# Patient Record
Sex: Female | Born: 1985 | Race: Black or African American | Hispanic: No | Marital: Single
Health system: Southern US, Community
[De-identification: ages and names within clinical notes are randomized; demographics above are authoritative.]

## PROBLEM LIST (undated history)

## (undated) ENCOUNTER — Inpatient Hospital Stay (HOSPITAL_COMMUNITY): Payer: Self-pay

## (undated) DIAGNOSIS — F1310 Sedative, hypnotic or anxiolytic abuse, uncomplicated: Secondary | ICD-10-CM

## (undated) DIAGNOSIS — N390 Urinary tract infection, site not specified: Secondary | ICD-10-CM

## (undated) DIAGNOSIS — Z8659 Personal history of other mental and behavioral disorders: Secondary | ICD-10-CM

## (undated) DIAGNOSIS — R519 Headache, unspecified: Secondary | ICD-10-CM

## (undated) DIAGNOSIS — A749 Chlamydial infection, unspecified: Secondary | ICD-10-CM

## (undated) DIAGNOSIS — F329 Major depressive disorder, single episode, unspecified: Secondary | ICD-10-CM

## (undated) DIAGNOSIS — F32A Depression, unspecified: Secondary | ICD-10-CM

## (undated) DIAGNOSIS — R51 Headache: Secondary | ICD-10-CM

## (undated) DIAGNOSIS — F909 Attention-deficit hyperactivity disorder, unspecified type: Secondary | ICD-10-CM

## (undated) DIAGNOSIS — R45850 Homicidal ideations: Secondary | ICD-10-CM

## (undated) HISTORY — PX: TONSILLECTOMY: SUR1361

---
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
## 2007-06-05 ENCOUNTER — Inpatient Hospital Stay (HOSPITAL_COMMUNITY): Admission: AD | Admit: 2007-06-05 | Discharge: 2007-06-05 | Payer: Self-pay | Admitting: Family Medicine

## 2007-06-16 ENCOUNTER — Emergency Department (HOSPITAL_COMMUNITY): Admission: EM | Admit: 2007-06-16 | Discharge: 2007-06-16 | Payer: Self-pay | Admitting: Emergency Medicine

## 2007-07-14 ENCOUNTER — Emergency Department (HOSPITAL_COMMUNITY): Admission: EM | Admit: 2007-07-14 | Discharge: 2007-07-14 | Payer: Self-pay | Admitting: Emergency Medicine

## 2008-01-20 ENCOUNTER — Emergency Department (HOSPITAL_COMMUNITY): Admission: EM | Admit: 2008-01-20 | Discharge: 2008-01-20 | Payer: Self-pay | Admitting: Emergency Medicine

## 2008-01-25 ENCOUNTER — Emergency Department (HOSPITAL_COMMUNITY): Admission: EM | Admit: 2008-01-25 | Discharge: 2008-01-25 | Payer: Self-pay | Admitting: Emergency Medicine

## 2008-02-23 ENCOUNTER — Inpatient Hospital Stay (HOSPITAL_COMMUNITY): Admission: AD | Admit: 2008-02-23 | Discharge: 2008-02-23 | Payer: Self-pay | Admitting: Obstetrics & Gynecology

## 2008-04-18 ENCOUNTER — Encounter: Payer: Self-pay | Admitting: Emergency Medicine

## 2008-04-18 ENCOUNTER — Inpatient Hospital Stay (HOSPITAL_COMMUNITY): Admission: AD | Admit: 2008-04-18 | Discharge: 2008-04-19 | Payer: Self-pay | Admitting: Obstetrics & Gynecology

## 2008-04-19 ENCOUNTER — Inpatient Hospital Stay (HOSPITAL_COMMUNITY): Admission: AD | Admit: 2008-04-19 | Discharge: 2008-04-19 | Payer: Self-pay | Admitting: Obstetrics & Gynecology

## 2008-05-28 DIAGNOSIS — IMO0002 Reserved for concepts with insufficient information to code with codable children: Secondary | ICD-10-CM | POA: Insufficient documentation

## 2008-05-31 ENCOUNTER — Inpatient Hospital Stay (HOSPITAL_COMMUNITY): Admission: AD | Admit: 2008-05-31 | Discharge: 2008-05-31 | Payer: Self-pay | Admitting: Obstetrics & Gynecology

## 2008-06-24 ENCOUNTER — Ambulatory Visit: Payer: Self-pay | Admitting: Obstetrics & Gynecology

## 2008-06-24 ENCOUNTER — Encounter: Payer: Self-pay | Admitting: Physician Assistant

## 2008-06-24 ENCOUNTER — Ambulatory Visit (HOSPITAL_COMMUNITY): Admission: RE | Admit: 2008-06-24 | Discharge: 2008-06-24 | Payer: Self-pay | Admitting: Obstetrics & Gynecology

## 2008-06-24 LAB — CONVERTED CEMR LAB
Antibody Screen: NEGATIVE
Chlamydia, DNA Probe: NEGATIVE
HCT: 32.4 % — ABNORMAL LOW (ref 36.0–46.0)
Hemoglobin: 10.8 g/dL — ABNORMAL LOW (ref 12.0–15.0)
Hepatitis B Surface Ag: NEGATIVE
Hgb A: 97.5 % (ref 96.8–97.8)
Hgb F Quant: 0 % (ref 0.0–2.0)
Hgb S Quant: 0 % (ref 0.0–0.0)
Lymphs Abs: 1.8 10*3/uL (ref 0.7–4.0)
MCHC: 33.3 g/dL (ref 30.0–36.0)
MCV: 94.7 fL (ref 78.0–100.0)
Neutro Abs: 3.5 10*3/uL (ref 1.7–7.7)
Platelets: 194 10*3/uL (ref 150–400)
RBC: 3.42 M/uL — ABNORMAL LOW (ref 3.87–5.11)
WBC: 6.2 10*3/uL (ref 4.0–10.5)

## 2008-07-15 ENCOUNTER — Other Ambulatory Visit: Payer: Self-pay | Admitting: Obstetrics & Gynecology

## 2008-07-15 ENCOUNTER — Ambulatory Visit: Payer: Self-pay | Admitting: Obstetrics & Gynecology

## 2008-07-22 ENCOUNTER — Other Ambulatory Visit: Payer: Self-pay | Admitting: Obstetrics & Gynecology

## 2008-07-27 ENCOUNTER — Ambulatory Visit: Payer: Self-pay | Admitting: Obstetrics and Gynecology

## 2008-07-27 ENCOUNTER — Inpatient Hospital Stay (HOSPITAL_COMMUNITY): Admission: AD | Admit: 2008-07-27 | Discharge: 2008-07-27 | Payer: Self-pay | Admitting: Obstetrics & Gynecology

## 2008-07-29 ENCOUNTER — Ambulatory Visit: Payer: Self-pay | Admitting: Obstetrics & Gynecology

## 2008-08-05 ENCOUNTER — Ambulatory Visit: Payer: Self-pay | Admitting: Obstetrics & Gynecology

## 2008-08-08 ENCOUNTER — Ambulatory Visit: Payer: Self-pay | Admitting: Family

## 2008-08-08 ENCOUNTER — Encounter: Payer: Self-pay | Admitting: Obstetrics & Gynecology

## 2008-08-08 ENCOUNTER — Inpatient Hospital Stay (HOSPITAL_COMMUNITY): Admission: AD | Admit: 2008-08-08 | Discharge: 2008-08-10 | Payer: Self-pay | Admitting: Obstetrics & Gynecology

## 2008-08-17 ENCOUNTER — Ambulatory Visit: Payer: Self-pay | Admitting: Family Medicine

## 2008-08-17 DIAGNOSIS — Z87891 Personal history of nicotine dependence: Secondary | ICD-10-CM

## 2008-08-19 ENCOUNTER — Telehealth: Payer: Self-pay | Admitting: *Deleted

## 2009-06-03 IMAGING — US US OB COMP LESS 14 WK
1 series · 14 of 28 positions shown · non-contrast
Comparison: none

CLINICAL DATA: Pregnant, abdominal pain

OBSTETRIC <14 WK ULTRASOUND, TRANSVAGINAL OB US, AND FETAL NUCHAL
TRANSLUCENCY US
TECHNIQUE: Transabdominal and transvaginal ultrasound was
performed for evaluation of the gestation as well as the maternal
uterus and adnexal regions.

[Series 1: unknown · 0.32mm/px · 14 of 51 slices shown]
[im 2/51]
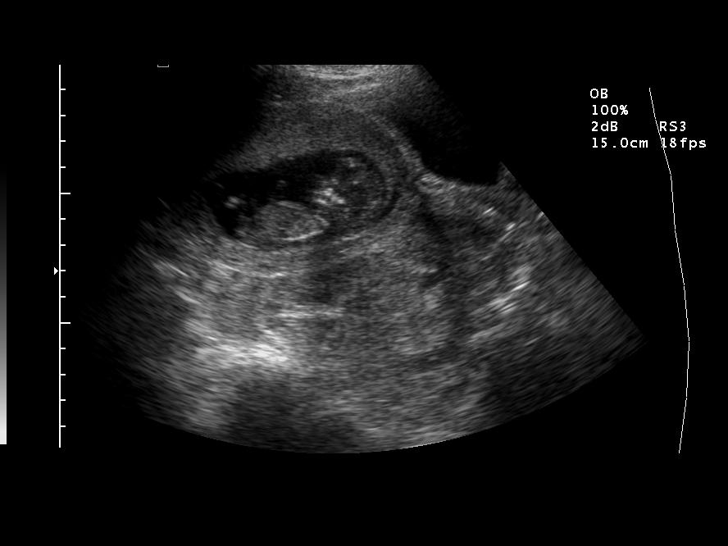
[im 6/51]
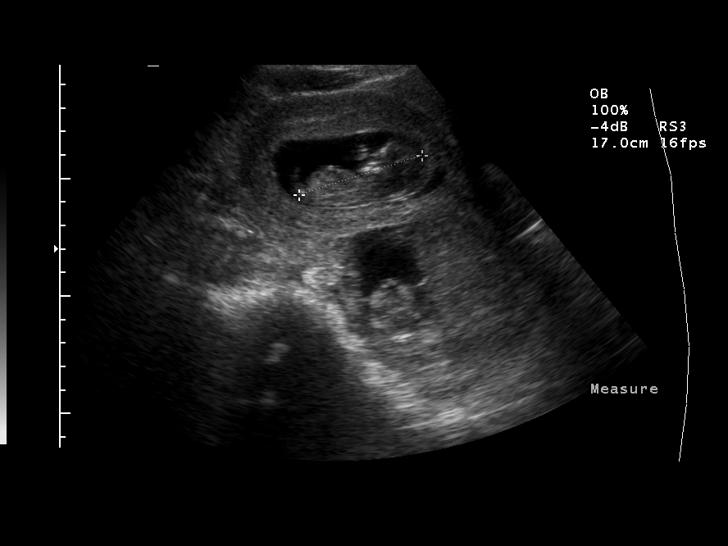
[im 10/51]
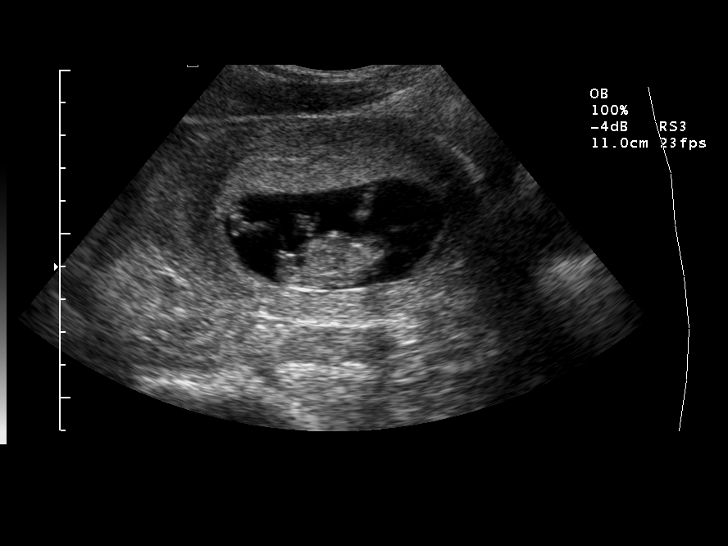
[im 13/51]
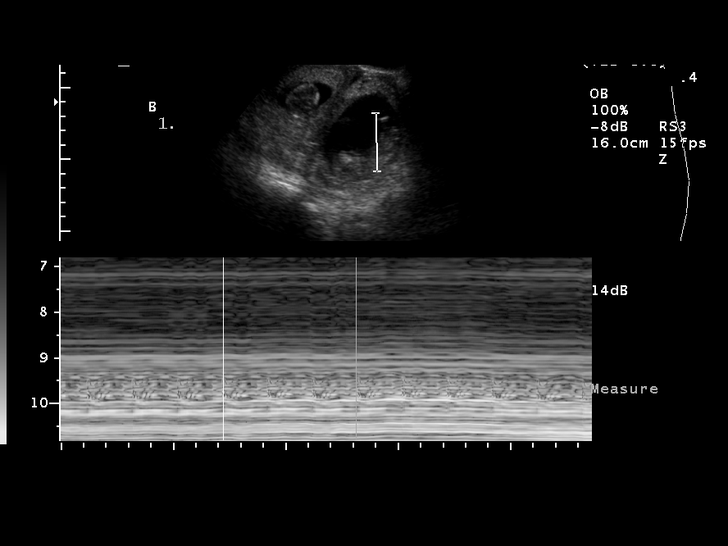
[im 17/51]
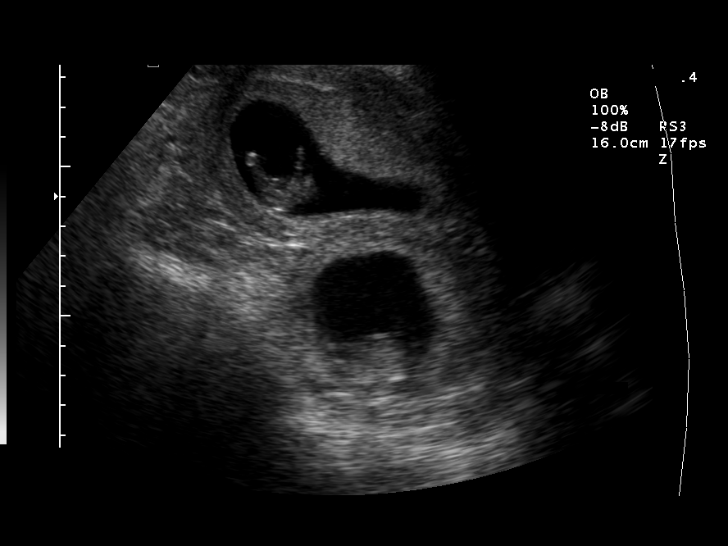
[im 21/51]
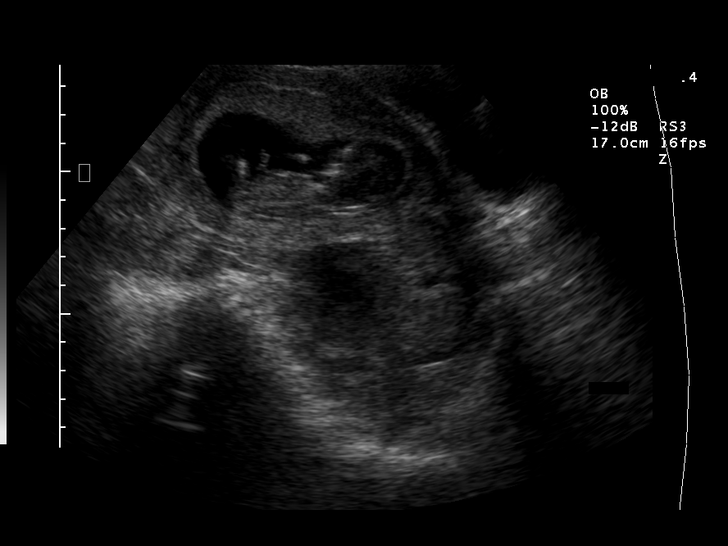
[im 25/51]
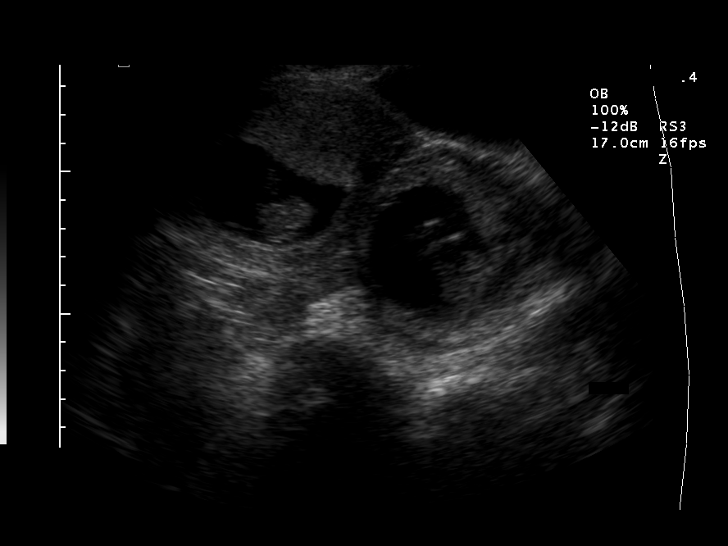
[im 28/51]
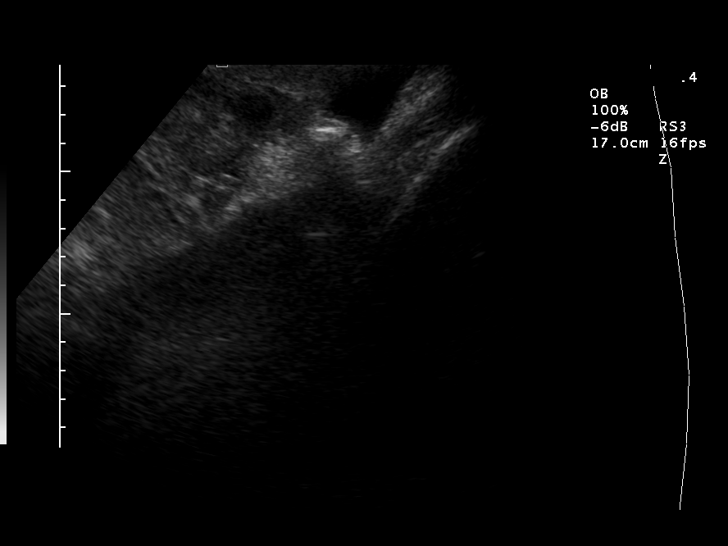
[im 32/51]
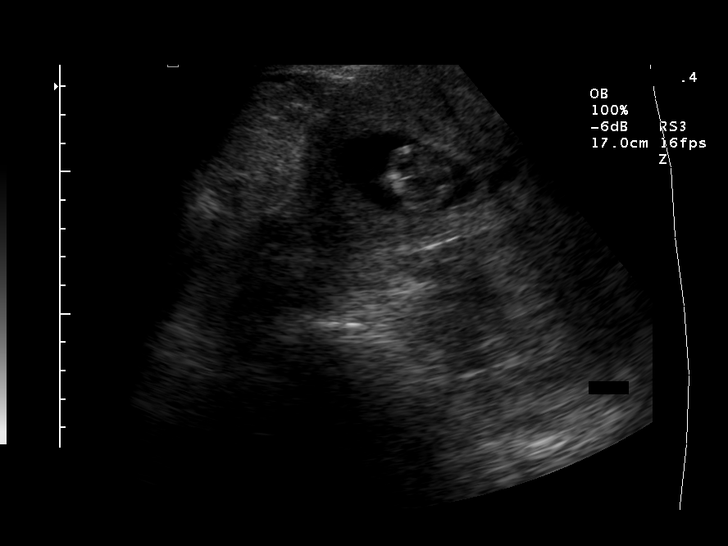
[im 36/51]
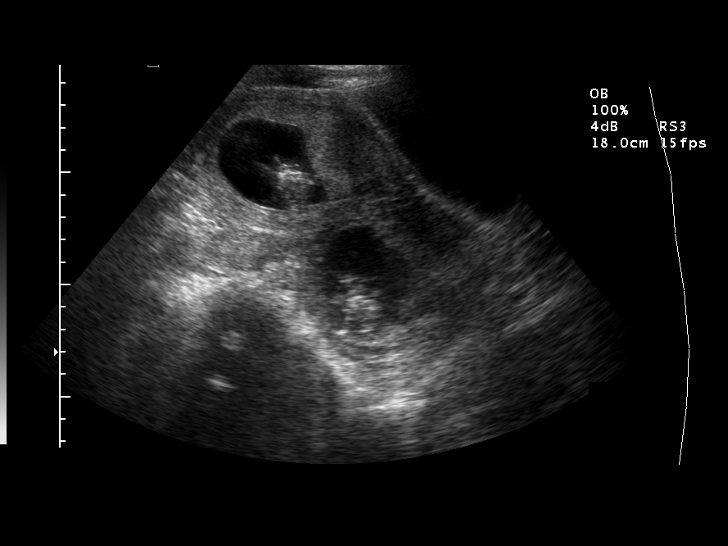
[im 39/51]
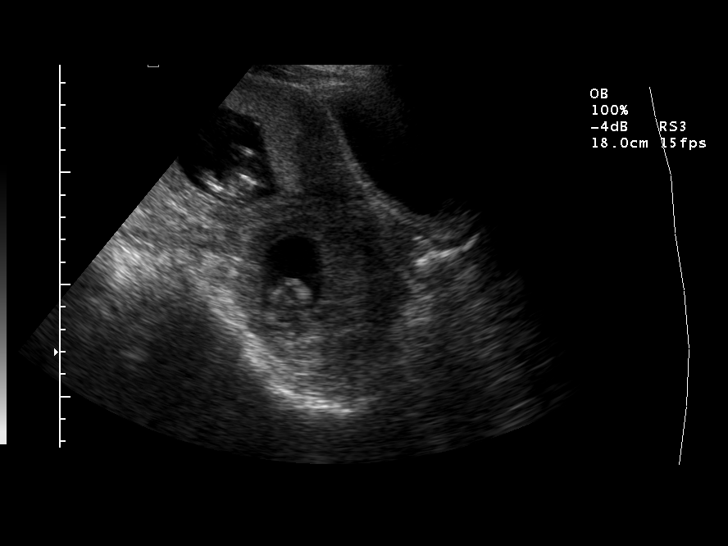
[im 43/51]
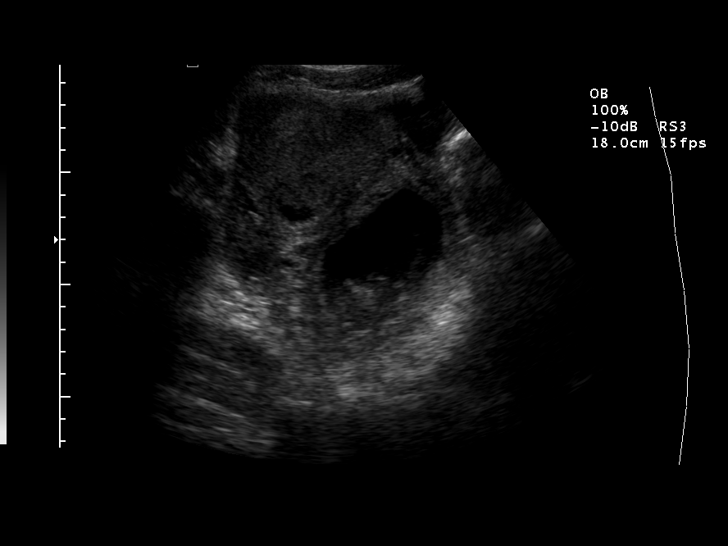
[im 47/51]
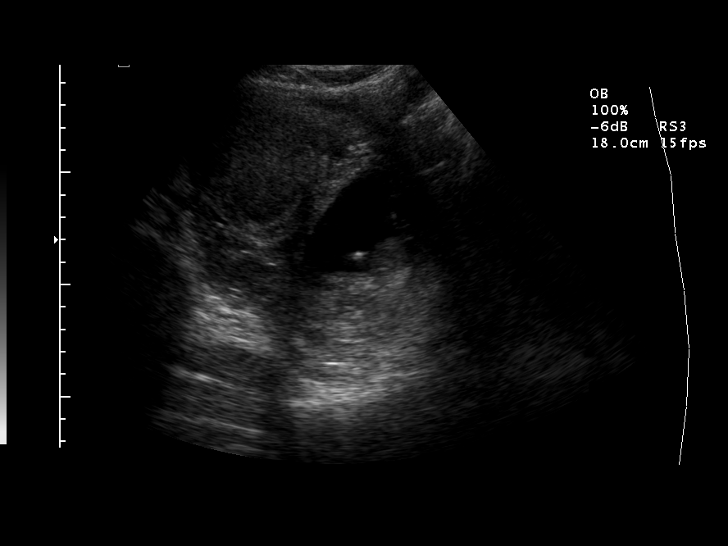
[im 51/51]
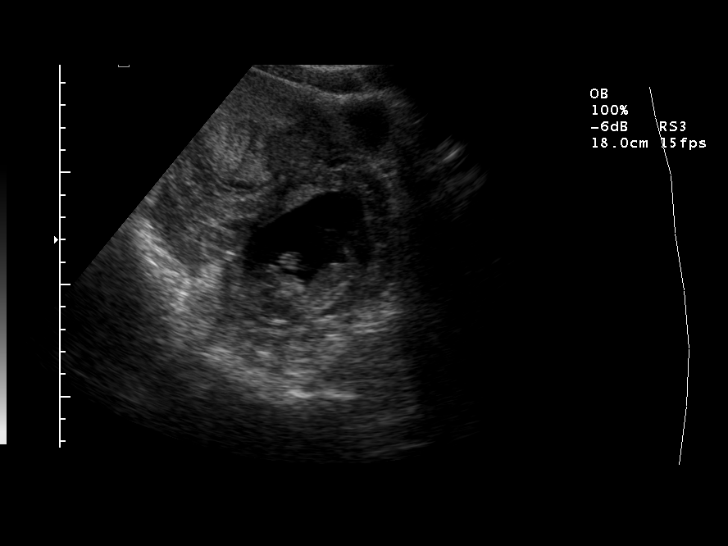

[14 of 28 positions shown; findings below may reference images not displayed]

Number of gestation: 2

Fetus A :

Heart Rate: 145 bpm

CRL:  61 millimeters.         12w  4d

Fetus B:

Heart Rate: 153 bpm

CRL:  51.6         12w  2d

Maternal uterus/adnexae:

The uterus appeared unremarkable.  The cervix was poorly
visualized.

No subchorionic hemorrhage identified.

A small amount of free fluid was identified within both adnexal
regions.
IMPRESSION: 1.  Two intrauterine pregnancy is identified without complicating
features.
2.  Small amount of free fluid within the pelvis may be
physiologic.

## 2009-08-31 IMAGING — US US OB COMP +14 WK
1 series · 14 of 28 positions shown · non-contrast
Comparison: none

CLINICAL DATA: Fall; assigned gestational age is 6 months with
twins per patient history

[Series 1: unknown · 0.35mm/px · 14 of 47 slices shown]
[im 2/47]
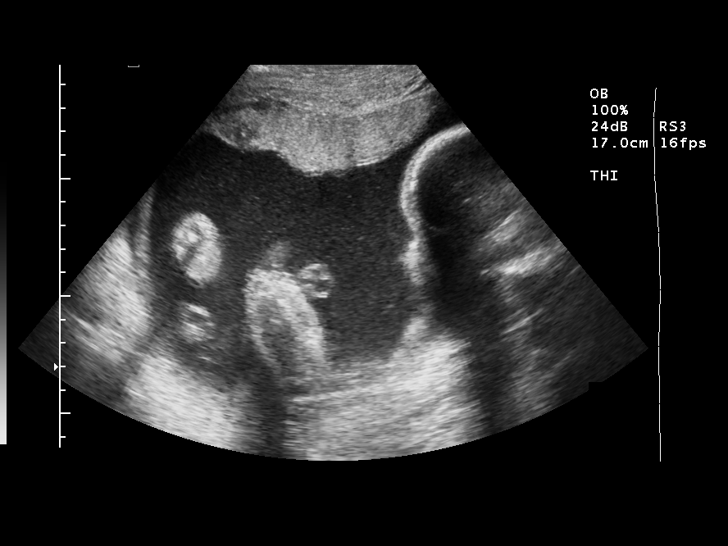
[im 6/47]
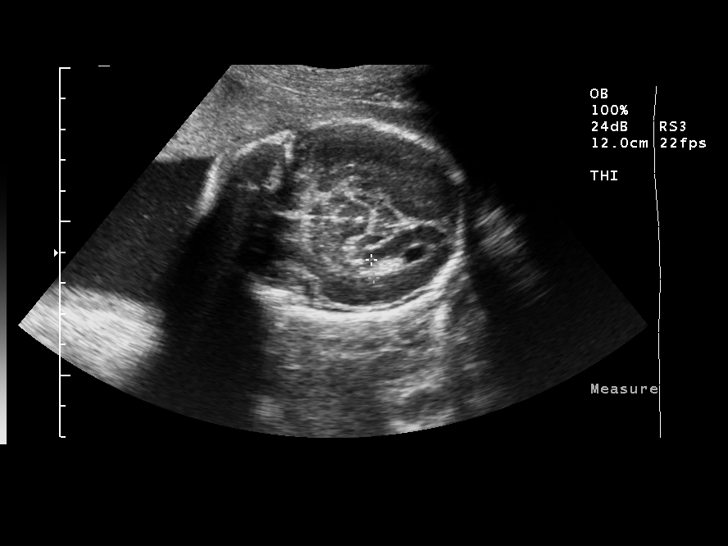
[im 9/47]
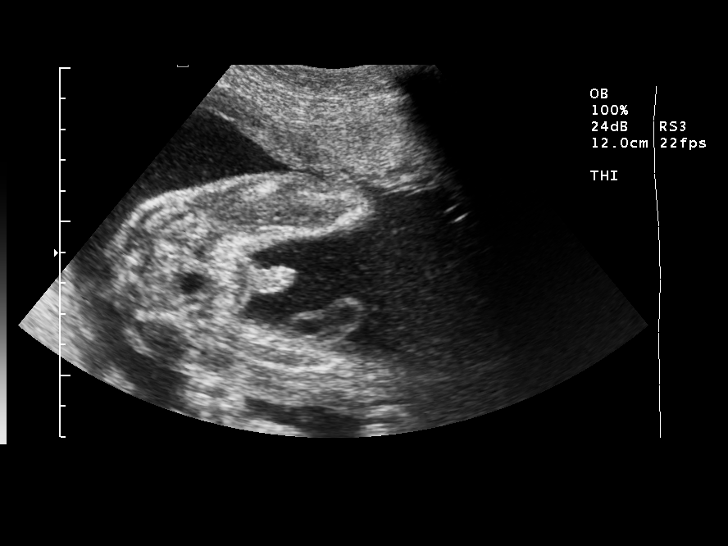
[im 12/47]
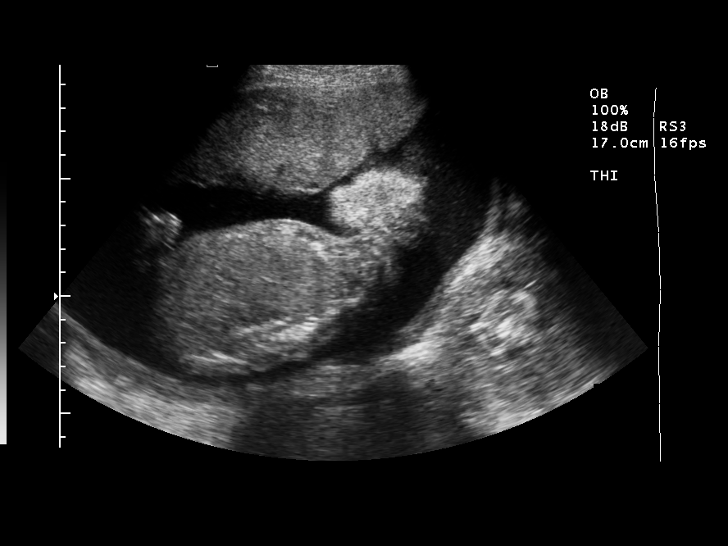
[im 16/47]
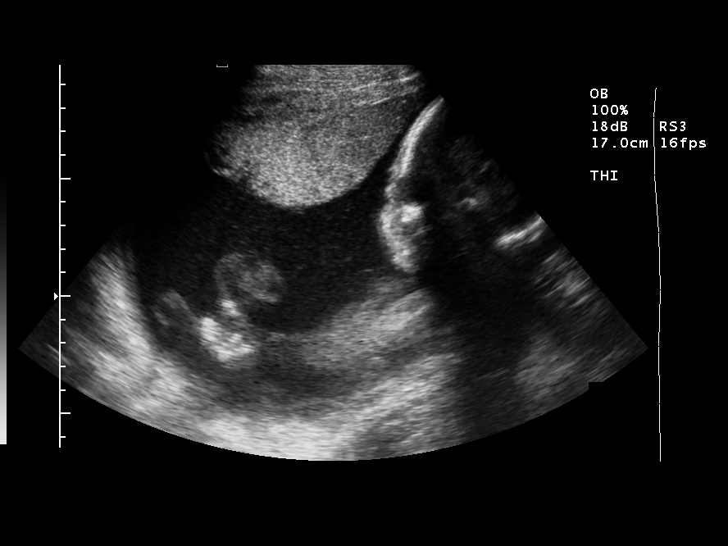
[im 19/47]
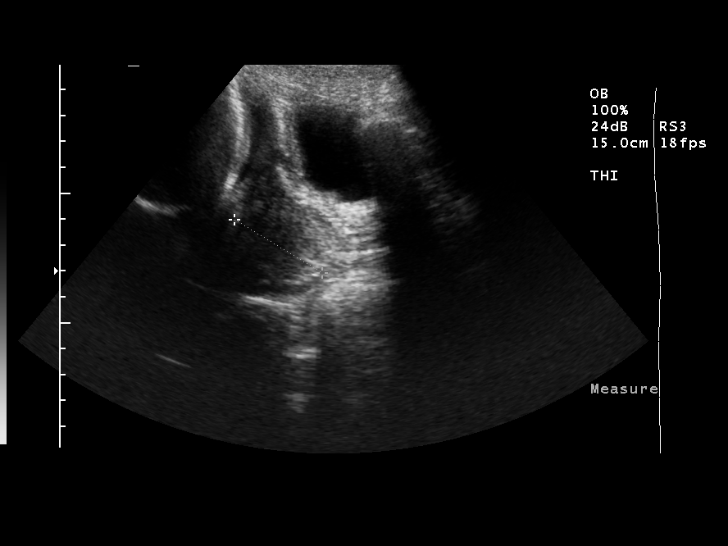
[im 23/47]
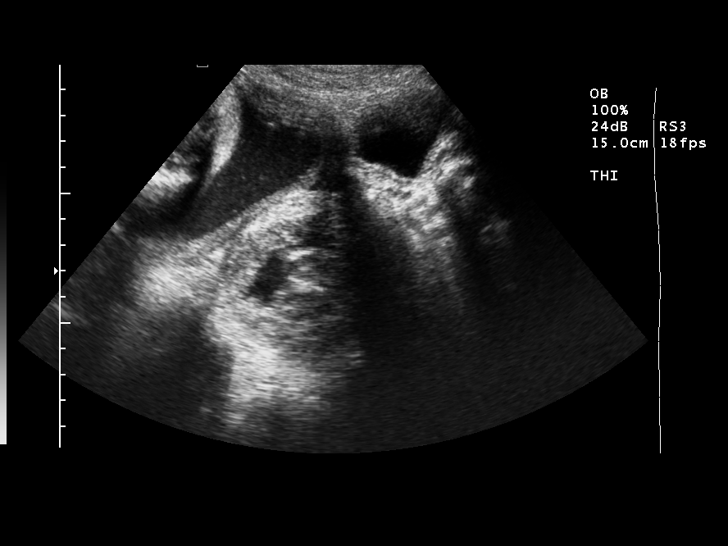
[im 26/47]
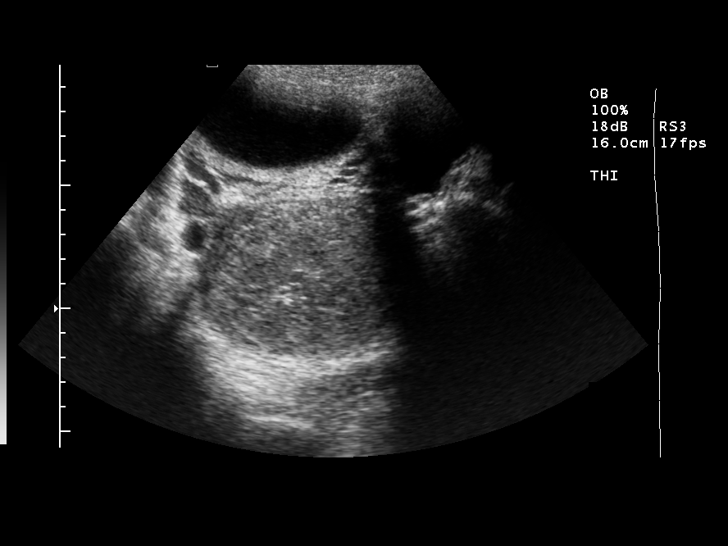
[im 29/47]
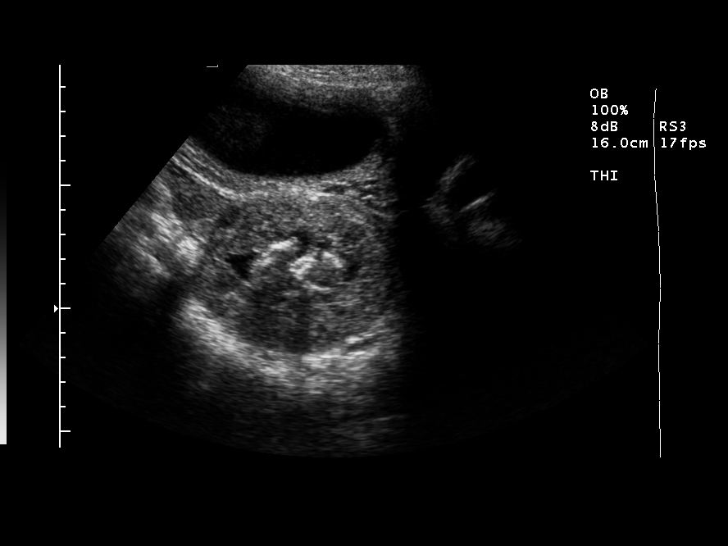
[im 33/47]
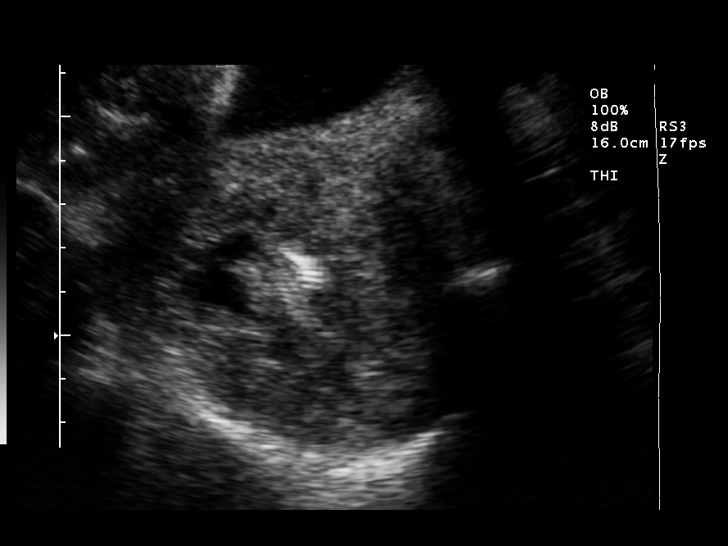
[im 36/47]
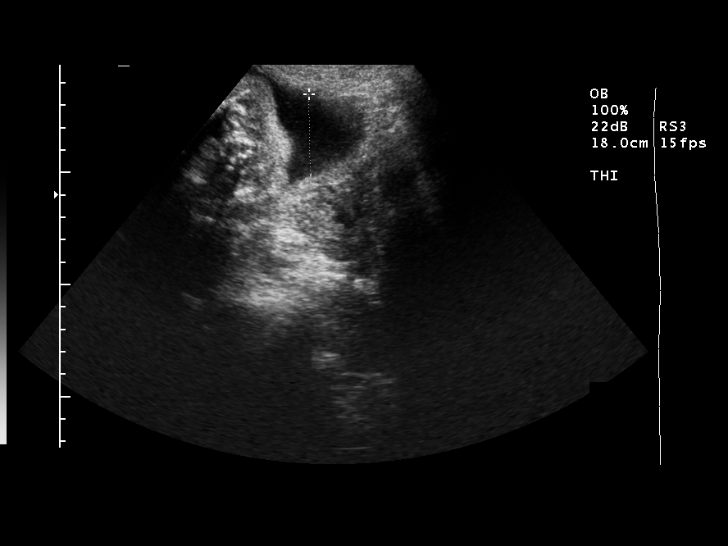
[im 40/47]
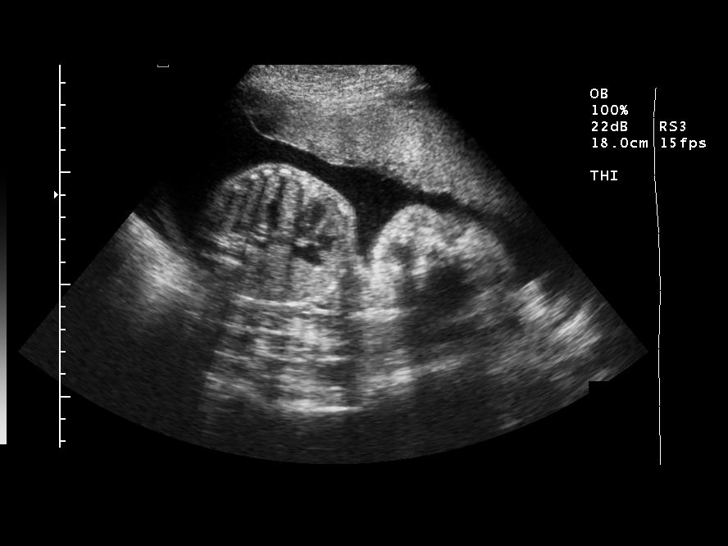
[im 43/47]
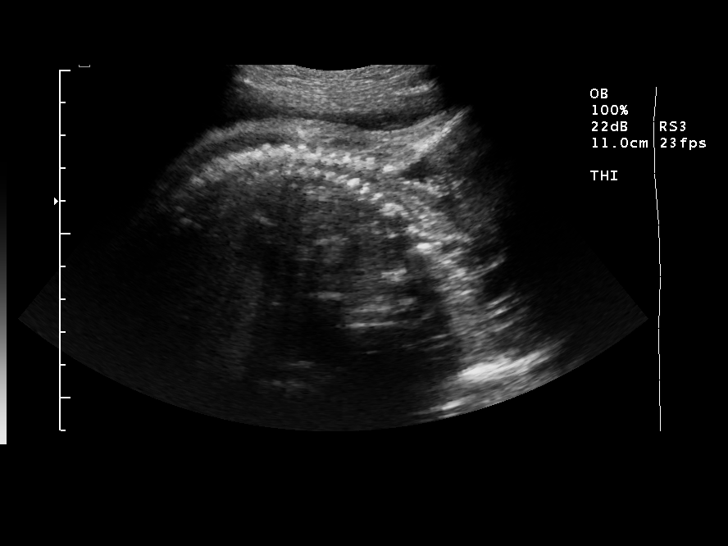
[im 47/47]
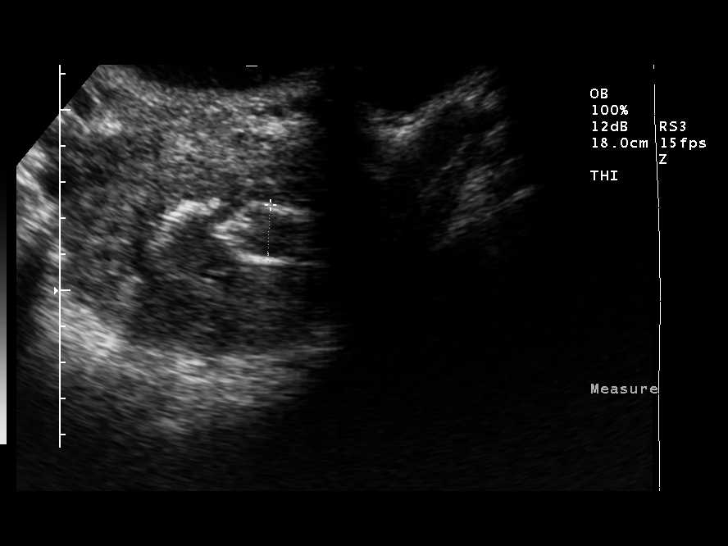

[14 of 28 positions shown; findings below may reference images not displayed]

LIMITED OBSTETRIC ULTRASOUND

Number of Living Fetuses: 1
Heart Rate: 527bpm
Movement: Present
Presentation: Cephalic
Placental Location: Anterior
Previa: No
Amniotic Fluid (Subjective): Normal
BPD = 6.4 cm   27w7d
HC                     74w4d

AC                     56w5d

FL                      89w5d
MATERNAL FINDINGS:
Cervix: 3.9cm
IMPRESSION: There is a single living intrauterine gestation with sonographic
gestational age today of 26 weeks 1 day.  The fetal indices are
concordant.  The anatomy scan is limited by fetal position.  Full
anatomy scan recommended on outpatient basis.  No retroplacental
are marginal placenta bleeding is identified.  The amniotic fluid
volume is within normal limits, and the cervix is long and closed.

There is a demised second fetus at the lower uterine segment
posterior to the cervix with BPD measuring approximately 12 weeks 1
day.

## 2010-06-19 ENCOUNTER — Emergency Department (HOSPITAL_COMMUNITY)
Admission: EM | Admit: 2010-06-19 | Discharge: 2010-06-19 | Payer: Self-pay | Source: Home / Self Care | Admitting: Emergency Medicine

## 2010-08-22 ENCOUNTER — Ambulatory Visit: Admit: 2010-08-22 | Payer: Self-pay

## 2010-09-12 ENCOUNTER — Ambulatory Visit: Admit: 2010-09-12 | Payer: Self-pay | Admitting: Family Medicine

## 2010-09-19 ENCOUNTER — Encounter: Payer: Self-pay | Admitting: *Deleted

## 2010-10-25 LAB — URINALYSIS, ROUTINE W REFLEX MICROSCOPIC
Ketones, ur: NEGATIVE mg/dL
Nitrite: NEGATIVE
Protein, ur: NEGATIVE mg/dL
Specific Gravity, Urine: 1.022 (ref 1.005–1.030)
Urobilinogen, UA: 1 mg/dL (ref 0.0–1.0)

## 2010-10-31 ENCOUNTER — Encounter: Payer: Self-pay | Admitting: Family Medicine

## 2010-11-01 NOTE — Progress Notes (Signed)
  This encounter was created in error - please disregard. Patient rescheduled for office visit next week.
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
## 2011-01-20 ENCOUNTER — Emergency Department (HOSPITAL_COMMUNITY)
Admission: EM | Admit: 2011-01-20 | Discharge: 2011-01-20 | Disposition: A | Discharge status: Home / Self Care | Payer: Self-pay | Attending: Emergency Medicine | Admitting: Emergency Medicine

## 2011-01-20 DIAGNOSIS — K2920 Alcoholic gastritis without bleeding: Secondary | ICD-10-CM | POA: Insufficient documentation

## 2011-01-20 DIAGNOSIS — F17200 Nicotine dependence, unspecified, uncomplicated: Secondary | ICD-10-CM | POA: Insufficient documentation

## 2011-01-20 DIAGNOSIS — R112 Nausea with vomiting, unspecified: Secondary | ICD-10-CM | POA: Insufficient documentation

## 2011-01-20 LAB — COMPREHENSIVE METABOLIC PANEL
Albumin: 4.2 g/dL (ref 3.5–5.2)
CO2: 24 mEq/L (ref 19–32)
Calcium: 9.4 mg/dL (ref 8.4–10.5)
Chloride: 104 mEq/L (ref 96–112)
Creatinine, Ser: 0.69 mg/dL (ref 0.4–1.2)
GFR calc Af Amer: >60 mL/min (ref >60)
Sodium: 138 mEq/L (ref 135–145)

## 2011-01-20 LAB — DIFFERENTIAL
Basophils Relative: 0 % (ref 0–1)
Eosinophils Absolute: 0 10*3/uL (ref 0.0–0.7)
Eosinophils Relative: 0 % (ref 0–5)
Lymphocytes Relative: 12 % (ref 12–46)
Lymphs Abs: 0.9 10*3/uL (ref 0.7–4.0)
Monocytes Relative: 5 % (ref 3–12)
Neutrophils Relative %: 83 % — ABNORMAL HIGH (ref 43–77)

## 2011-01-20 LAB — CBC
HCT: 37.8 % (ref 36.0–46.0)
MCV: 90.9 fL (ref 78.0–100.0)
Platelets: 281 10*3/uL (ref 150–400)
RDW: 12.3 % (ref 11.5–15.5)
WBC: 7.4 10*3/uL (ref 4.0–10.5)

## 2011-01-20 LAB — URINALYSIS, ROUTINE W REFLEX MICROSCOPIC
Bilirubin Urine: NEGATIVE
Ketones, ur: 15 mg/dL — AB
Leukocytes, UA: NEGATIVE
Specific Gravity, Urine: 1.022 (ref 1.005–1.030)
pH: 8.5 — ABNORMAL HIGH (ref 5.0–8.0)

## 2011-01-20 LAB — URINE MICROSCOPIC-ADD ON

## 2011-05-11 LAB — COMPREHENSIVE METABOLIC PANEL
AST: 24
Albumin: 3.5
BUN: 10
Chloride: 102
Creatinine, Ser: 0.71
GFR calc Af Amer: >60
Total Bilirubin: 0.9
Total Protein: 7.5

## 2011-05-11 LAB — CBC
HCT: 26.2 — ABNORMAL LOW
HCT: 33.6 — ABNORMAL LOW
Hemoglobin: 11.6 — ABNORMAL LOW
MCHC: 34.7
MCV: 95.7
Platelets: 195
RBC: 3.52 — ABNORMAL LOW
RDW: 12.2
RDW: 12.5
WBC: 7.1
WBC: 8

## 2011-05-11 LAB — URINE MICROSCOPIC-ADD ON

## 2011-05-11 LAB — URINALYSIS, ROUTINE W REFLEX MICROSCOPIC
Bilirubin Urine: NEGATIVE
Glucose, UA: NEGATIVE
Glucose, UA: NEGATIVE
Hgb urine dipstick: NEGATIVE
Hgb urine dipstick: NEGATIVE
Ketones, ur: NEGATIVE
Nitrite: NEGATIVE
Protein, ur: NEGATIVE
Specific Gravity, Urine: 1.024
Specific Gravity, Urine: 1.029
Specific Gravity, Urine: <1.005 — ABNORMAL LOW
Urobilinogen, UA: 1
Urobilinogen, UA: 0.2
pH: 7

## 2011-05-11 LAB — WET PREP, GENITAL: Trich, Wet Prep: NONE SEEN

## 2011-05-11 LAB — DIFFERENTIAL
Basophils Absolute: 0
Eosinophils Relative: 0
Eosinophils Relative: 1
Lymphocytes Relative: 11 — ABNORMAL LOW
Lymphs Abs: 0.8
Monocytes Absolute: 0.4
Monocytes Relative: 6
Monocytes Relative: 6
Neutro Abs: 5.7

## 2011-05-11 LAB — POCT I-STAT, CHEM 8
BUN: 13
Chloride: 102
Creatinine, Ser: 0.8
Potassium: 2.9 — ABNORMAL LOW
Sodium: 138
TCO2: 21

## 2011-05-11 LAB — POCT PREGNANCY, URINE
Operator id: 277751
Preg Test, Ur: POSITIVE

## 2011-05-11 LAB — GC/CHLAMYDIA PROBE AMP, GENITAL
Chlamydia, DNA Probe: NEGATIVE
Chlamydia, DNA Probe: NEGATIVE

## 2011-05-11 LAB — RAPID URINE DRUG SCREEN, HOSP PERFORMED: Barbiturates: NOT DETECTED

## 2011-05-11 LAB — RPR: RPR Ser Ql: NONREACTIVE

## 2011-05-15 LAB — URINALYSIS, ROUTINE W REFLEX MICROSCOPIC
Bilirubin Urine: NEGATIVE
Ketones, ur: NEGATIVE
Nitrite: NEGATIVE
Specific Gravity, Urine: 1.02
Urobilinogen, UA: 1

## 2011-05-15 LAB — GC/CHLAMYDIA PROBE AMP, GENITAL: Chlamydia, DNA Probe: NEGATIVE

## 2011-05-15 LAB — STREP B DNA PROBE: Strep Group B Ag: NEGATIVE

## 2011-05-15 LAB — WET PREP, GENITAL

## 2011-05-16 LAB — POCT URINALYSIS DIP (DEVICE)
Nitrite: NEGATIVE
Operator id: 287931
Protein, ur: 30 — AB
pH: 7

## 2011-05-17 LAB — POCT I-STAT, CHEM 8
Calcium, Ion: 1.24
Chloride: 104
Glucose, Bld: 83
HCT: 30 — ABNORMAL LOW
Hemoglobin: 10.2 — ABNORMAL LOW
Potassium: 3.4 — ABNORMAL LOW

## 2011-05-17 LAB — DIFFERENTIAL
Basophils Absolute: 0
Lymphocytes Relative: 35
Lymphs Abs: 1.8
Neutrophils Relative %: 53

## 2011-05-17 LAB — URINE MICROSCOPIC-ADD ON

## 2011-05-17 LAB — URINALYSIS, ROUTINE W REFLEX MICROSCOPIC

## 2011-05-17 LAB — KLEIHAUER-BETKE STAIN
Fetal Cells %: 0
Quantitation Fetal Hemoglobin: 0

## 2011-05-17 LAB — CBC
Platelets: 168
RDW: 13.2
WBC: 5.1

## 2011-05-17 LAB — ABO/RH: ABO/RH(D): A POS

## 2011-05-19 LAB — POCT URINALYSIS DIP (DEVICE)
Bilirubin Urine: NEGATIVE
Bilirubin Urine: NEGATIVE
Glucose, UA: NEGATIVE mg/dL
Glucose, UA: NEGATIVE mg/dL
Glucose, UA: NEGATIVE mg/dL
Glucose, UA: NEGATIVE mg/dL
Hgb urine dipstick: NEGATIVE
Ketones, ur: NEGATIVE mg/dL
Ketones, ur: NEGATIVE mg/dL
Nitrite: NEGATIVE
Nitrite: NEGATIVE
Protein, ur: 100 mg/dL — AB
Protein, ur: 30 mg/dL — AB
Specific Gravity, Urine: 1.015 (ref 1.005–1.030)
Specific Gravity, Urine: 1.015 (ref 1.005–1.030)
Urobilinogen, UA: 1 mg/dL (ref 0.0–1.0)
Urobilinogen, UA: 1 mg/dL (ref 0.0–1.0)

## 2011-05-19 LAB — CBC
HCT: 35.8 % — ABNORMAL LOW (ref 36.0–46.0)
Hemoglobin: 12.3 g/dL (ref 12.0–15.0)
RBC: 3.68 MIL/uL — ABNORMAL LOW (ref 3.87–5.11)
WBC: 7.5 10*3/uL (ref 4.0–10.5)

## 2011-05-23 LAB — POCT PREGNANCY, URINE
Operator id: 151321
Preg Test, Ur: NEGATIVE

## 2011-05-23 LAB — WOUND CULTURE

## 2011-05-23 LAB — POCT I-STAT CREATININE: Operator id: 151321

## 2011-05-23 LAB — I-STAT 8, (EC8 V) (CONVERTED LAB)
BUN: 12
Bicarbonate: 27.8 — ABNORMAL HIGH
Hemoglobin: 16 — ABNORMAL HIGH
Operator id: 151321
Sodium: 140
TCO2: 30
pCO2, Ven: 58.8 — ABNORMAL HIGH

## 2011-05-24 LAB — WET PREP, GENITAL

## 2011-05-24 LAB — URINALYSIS, ROUTINE W REFLEX MICROSCOPIC
Bilirubin Urine: NEGATIVE
Ketones, ur: NEGATIVE
Leukocytes, UA: NEGATIVE
Nitrite: NEGATIVE
Specific Gravity, Urine: 1.025
Urobilinogen, UA: 0.2
pH: 7

## 2011-05-24 LAB — POCT PREGNANCY, URINE: Preg Test, Ur: NEGATIVE

## 2011-11-29 ENCOUNTER — Encounter (HOSPITAL_COMMUNITY): Payer: Self-pay | Admitting: Emergency Medicine

## 2011-11-29 ENCOUNTER — Emergency Department (HOSPITAL_COMMUNITY)
Admission: EM | Admit: 2011-11-29 | Discharge: 2011-11-29 | Disposition: A | Discharge status: Other Acute Inpatient Hospital | Payer: Self-pay | Attending: Emergency Medicine | Admitting: Emergency Medicine

## 2011-11-29 DIAGNOSIS — R110 Nausea: Secondary | ICD-10-CM | POA: Insufficient documentation

## 2011-11-29 DIAGNOSIS — N390 Urinary tract infection, site not specified: Secondary | ICD-10-CM | POA: Insufficient documentation

## 2011-11-29 DIAGNOSIS — Z3202 Encounter for pregnancy test, result negative: Secondary | ICD-10-CM | POA: Insufficient documentation

## 2011-11-29 HISTORY — DX: Depression, unspecified: F32.A

## 2011-11-29 HISTORY — DX: Major depressive disorder, single episode, unspecified: F32.9

## 2011-11-29 LAB — COMPREHENSIVE METABOLIC PANEL
ALT: 9 U/L (ref 0–35)
AST: 15 U/L (ref 0–37)
Calcium: 9.2 mg/dL (ref 8.4–10.5)
Creatinine, Ser: 0.95 mg/dL (ref 0.50–1.10)
GFR calc Af Amer: >90 mL/min (ref >90)
Sodium: 136 mEq/L (ref 135–145)
Total Protein: 7.3 g/dL (ref 6.0–8.3)

## 2011-11-29 LAB — PREGNANCY, URINE: Preg Test, Ur: NEGATIVE

## 2011-11-29 LAB — CBC
HCT: 37.7 % (ref 36.0–46.0)
Hemoglobin: 13 g/dL (ref 12.0–15.0)
MCH: 32.3 pg (ref 26.0–34.0)
MCHC: 34.5 g/dL (ref 30.0–36.0)
RBC: 4.03 MIL/uL (ref 3.87–5.11)

## 2011-11-29 LAB — URINE MICROSCOPIC-ADD ON

## 2011-11-29 LAB — URINALYSIS, ROUTINE W REFLEX MICROSCOPIC
Protein, ur: NEGATIVE mg/dL
Urobilinogen, UA: 1 mg/dL (ref 0.0–1.0)

## 2011-11-29 MED ORDER — ONDANSETRON HCL 4 MG PO TABS: 4.0000 mg | ORAL_TABLET | Freq: Four times a day (QID) | ORAL | Status: AC

## 2011-11-29 MED ORDER — CIPROFLOXACIN HCL 500 MG PO TABS: 500.0000 mg | ORAL_TABLET | Freq: Two times a day (BID) | ORAL | Status: AC

## 2011-11-29 NOTE — ED Notes (Signed)
[facility] called to inform tht lab tests were faxed and to call back stating it is OK to discharge patient back to [facility].

## 2011-11-29 NOTE — ED Provider Notes (Signed)
Medical screening examination/treatment/procedure(s) were performed by non-physician practitioner and as supervising physician I was immediately available for consultation/collaboration.   Laray Anger, DO 11/29/11 2006

## 2011-11-29 NOTE — ED Provider Notes (Signed)
History     CSN: [id number]  Arrival date & time 11/29/11  1253   First MD Initiated Contact with Patient 11/29/11 1304      Chief Complaint  Patient presents with  . V70.1    (Consider location/radiation/quality/duration/timing/severity/associated sxs/prior treatment) HPI  Pt sent to the ED from [facility] with requests for Pregnancy test. The patient is under an IVC order currently. She told the people at the facility today that she feels nauseous and is two weeks late on her period. She denies having abdominal pain, vomiting or diarrhea. She states that these symptoms feel like her morning sickness when she was pregnant last time.   Past Medical History  Diagnosis Date  . Depression     No past surgical history on file.  No family history on file.  History  Substance Use Topics  . Smoking status: Current Some Day Smoker  . Smokeless tobacco: Not on file  . Alcohol Use: Yes    OB History    Grav Para Term Preterm Abortions TAB SAB Ect Mult Living                  Review of Systems  All other systems reviewed and are negative.    Allergies  Review of patient's allergies indicates no known allergies.  Home Medications   Current Outpatient Rx  Name Route Sig Dispense Refill  . FLUOXETINE HCL 20 MG PO CAPS Oral Take 20 mg by mouth every morning.      Marland Kitchen CIPROFLOXACIN HCL 500 MG PO TABS Oral Take 1 tablet (500 mg total) by mouth every 12 (twelve) hours. 10 tablet 0  . ONDANSETRON HCL 4 MG PO TABS Oral Take 1 tablet (4 mg total) by mouth every 6 (six) hours. 12 tablet 0    BP 104/61  Pulse 86  Temp 98.9 F (37.2 C)  SpO2 100%  LMP 11/01/2011  Physical Exam  Nursing note and vitals reviewed. Constitutional: She appears well-developed and well-nourished. No distress.  HENT:  Head: Normocephalic and atraumatic.  Eyes: Pupils are equal, round, and reactive to light.  Neck: Normal range of motion. Neck supple.  Cardiovascular: Normal rate and regular rhythm.    Pulmonary/Chest: Effort normal.  Abdominal: Soft.  Neurological: She is alert.  Skin: Skin is warm and dry.    ED Course  Procedures (including critical care time)  Labs Reviewed  URINALYSIS, ROUTINE W REFLEX MICROSCOPIC - Abnormal; Notable for the following:    APPearance CLOUDY (*)    Leukocytes, UA LARGE (*)    All other components within normal limits  COMPREHENSIVE METABOLIC PANEL - Abnormal; Notable for the following:    GFR calc non Af Amer 84 (*)    All other components within normal limits  CBC - Abnormal; Notable for the following:    WBC 3.8 (*)    All other components within normal limits  URINE MICROSCOPIC-ADD ON - Abnormal; Notable for the following:    Squamous Epithelial / LPF MANY (*)    Bacteria, UA FEW (*)    All other components within normal limits  PREGNANCY, URINE   No results found.   1. UTI (lower urinary tract infection)   2. Nausea       MDM  pts  Pregnancy test is negative, she does however have a UTI. Will give antibiotics and Rx for Zofran.  Pt will be escorted back to [facility] to continue treatment.  Pt has been advised of the symptoms that warrant their  return to the ED. Patient has voiced understanding and has agreed to follow-up with the PCP or specialist.        Dorthula Matas, PA 11/29/11 [phone]

## 2011-11-29 NOTE — ED Notes (Signed)
Pt reported that her period was light last month and late starting this month.  Pt c/o morning sickness and nausea and would like a urine pregnancy test.

## 2011-11-29 NOTE — Discharge Instructions (Signed)
Urinary Tract Infection Infections of the urinary tract can start in several places. A bladder infection (cystitis), a kidney infection (pyelonephritis), and a prostate infection (prostatitis) are different types of urinary tract infections (UTIs). They usually get better if treated with medicines (antibiotics) that kill germs. Take all the medicine until it is gone. You or your child may feel better in a few days, but TAKE ALL MEDICINE or the infection may not respond and may become more difficult to treat. HOME CARE INSTRUCTIONS   Drink enough water and fluids to keep the urine clear or pale yellow. Cranberry juice is especially recommended, in addition to large amounts of water.   Avoid caffeine, tea, and carbonated beverages. They tend to irritate the bladder.   Alcohol may irritate the prostate.   Only take over-the-counter or prescription medicines for pain, discomfort, or fever as directed by your caregiver.  To prevent further infections:  Empty the bladder often. Avoid holding urine for long periods of time.   After a bowel movement, women should cleanse from front to back. Use each tissue only once.   Empty the bladder before and after sexual intercourse.  FINDING OUT THE RESULTS OF YOUR TEST Not all test results are available during your visit. If your or your child's test results are not back during the visit, make an appointment with your caregiver to find out the results. Do not assume everything is normal if you have not heard from your caregiver or the medical facility. It is important for you to follow up on all test results. SEEK MEDICAL CARE IF:   There is back pain.   Your baby is older than 3 months with a rectal temperature of 100.5 F (38.1 C) or higher for more than 1 day.   Your or your child's problems (symptoms) are no better in 3 days. Return sooner if you or your child is getting worse.  SEEK IMMEDIATE MEDICAL CARE IF:   There is severe back pain or lower  abdominal pain.   You or your child develops chills.   You have a fever.   Your baby is older than 3 months with a rectal temperature of 102 F (38.9 C) or higher.   Your baby is 72 months old or younger with a rectal temperature of 100.4 F (38 C) or higher.   There is nausea or vomiting.   There is continued burning or discomfort with urination.  MAKE SURE YOU:   Understand these instructions.   Will watch your condition.   Will get help right away if you are not doing well or get worse.  Document Released: 05/10/2005 Document Revised: 07/20/2011 Document Reviewed: 12/13/2006 [facility] Patient Information 2012 [place], [place].Nausea, Adult Nausea is the feeling that you have an upset stomach or have to vomit. Nausea by itself is not likely a serious concern, but it may be an early sign of more serious medical problems. As nausea gets worse, it can lead to vomiting. If vomiting develops, there is the risk of dehydration.  CAUSES   Viral infections.   Food poisoning.   Medicines.   Pregnancy.   Motion sickness.   Migraine headaches.   Emotional distress.   Severe pain from any source.   Alcohol intoxication.  HOME CARE INSTRUCTIONS  Get plenty of rest.   Ask your caregiver about specific rehydration instructions.   Eat small amounts of food and sip liquids more often.   Take all medicines as told by your caregiver.  SEEK MEDICAL  CARE IF:  You have not improved after 2 days, or you get worse.   You have a headache.  SEEK IMMEDIATE MEDICAL CARE IF:   You have a fever.   You faint.   You keep vomiting or have blood in your vomit.   You are extremely weak or dehydrated.   You have dark or bloody stools.   You have severe chest or abdominal pain.  MAKE SURE YOU:  Understand these instructions.   Will watch your condition.   Will get help right away if you are not doing well or get worse.  Document Released: 09/07/2004 Document Revised: 07/20/2011  Document Reviewed: 04/12/2011 [facility] Patient Information 2012 [place], [place].

## 2012-03-27 ENCOUNTER — Emergency Department (HOSPITAL_COMMUNITY)
Admission: EM | Admit: 2012-03-27 | Discharge: 2012-03-27 | Disposition: A | Discharge status: Other Acute Inpatient Hospital | Payer: Self-pay | Attending: Emergency Medicine | Admitting: Emergency Medicine

## 2012-03-27 ENCOUNTER — Encounter (HOSPITAL_COMMUNITY): Payer: Self-pay

## 2012-03-27 ENCOUNTER — Inpatient Hospital Stay (HOSPITAL_COMMUNITY)
Admission: AD | Admit: 2012-03-27 | Discharge: 2012-04-02 | DRG: 897 | Disposition: A | Discharge status: Home / Self Care | Payer: Federal, State, Local not specified - Other | Source: Ambulatory Visit | Attending: Psychiatry | Admitting: Psychiatry

## 2012-03-27 ENCOUNTER — Encounter (HOSPITAL_COMMUNITY): Payer: Self-pay | Admitting: *Deleted

## 2012-03-27 DIAGNOSIS — F1210 Cannabis abuse, uncomplicated: Secondary | ICD-10-CM | POA: Diagnosis present

## 2012-03-27 DIAGNOSIS — F17200 Nicotine dependence, unspecified, uncomplicated: Secondary | ICD-10-CM | POA: Insufficient documentation

## 2012-03-27 DIAGNOSIS — F411 Generalized anxiety disorder: Secondary | ICD-10-CM | POA: Diagnosis present

## 2012-03-27 DIAGNOSIS — F1910 Other psychoactive substance abuse, uncomplicated: Secondary | ICD-10-CM | POA: Insufficient documentation

## 2012-03-27 DIAGNOSIS — F1310 Sedative, hypnotic or anxiolytic abuse, uncomplicated: Secondary | ICD-10-CM | POA: Diagnosis present

## 2012-03-27 DIAGNOSIS — F1410 Cocaine abuse, uncomplicated: Principal | ICD-10-CM | POA: Diagnosis present

## 2012-03-27 DIAGNOSIS — F3289 Other specified depressive episodes: Secondary | ICD-10-CM | POA: Insufficient documentation

## 2012-03-27 DIAGNOSIS — F329 Major depressive disorder, single episode, unspecified: Secondary | ICD-10-CM | POA: Insufficient documentation

## 2012-03-27 DIAGNOSIS — F609 Personality disorder, unspecified: Secondary | ICD-10-CM | POA: Diagnosis present

## 2012-03-27 DIAGNOSIS — F909 Attention-deficit hyperactivity disorder, unspecified type: Secondary | ICD-10-CM

## 2012-03-27 DIAGNOSIS — F1010 Alcohol abuse, uncomplicated: Secondary | ICD-10-CM | POA: Diagnosis present

## 2012-03-27 LAB — COMPREHENSIVE METABOLIC PANEL
AST: 20 U/L (ref 0–37)
Albumin: 3.6 g/dL (ref 3.5–5.2)
Alkaline Phosphatase: 52 U/L (ref 39–117)
Chloride: 107 mEq/L (ref 96–112)
Potassium: 3.5 mEq/L (ref 3.5–5.1)
Sodium: 142 mEq/L (ref 135–145)
Total Bilirubin: 0.2 mg/dL — ABNORMAL LOW (ref 0.3–1.2)
Total Protein: 7.4 g/dL (ref 6.0–8.3)

## 2012-03-27 LAB — CBC WITH DIFFERENTIAL/PLATELET
Basophils Absolute: 0 10*3/uL (ref 0.0–0.1)
Basophils Relative: 0 % (ref 0–1)
Eosinophils Absolute: 0.2 10*3/uL (ref 0.0–0.7)
Hemoglobin: 12.1 g/dL (ref 12.0–15.0)
MCH: 32.4 pg (ref 26.0–34.0)
MCHC: 34.7 g/dL (ref 30.0–36.0)
Neutro Abs: 1.6 10*3/uL — ABNORMAL LOW (ref 1.7–7.7)
Neutrophils Relative %: 34 % — ABNORMAL LOW (ref 43–77)
Platelets: 226 10*3/uL (ref 150–400)
RDW: 12.4 % (ref 11.5–15.5)

## 2012-03-27 LAB — RAPID URINE DRUG SCREEN, HOSP PERFORMED
Amphetamines: NOT DETECTED
Benzodiazepines: NOT DETECTED
Tetrahydrocannabinol: POSITIVE — AB

## 2012-03-27 LAB — URINALYSIS, ROUTINE W REFLEX MICROSCOPIC
Glucose, UA: NEGATIVE mg/dL
Nitrite: NEGATIVE
Protein, ur: NEGATIVE mg/dL

## 2012-03-27 LAB — URINE MICROSCOPIC-ADD ON

## 2012-03-27 LAB — ETHANOL: Alcohol, Ethyl (B): <11 mg/dL (ref 0–11)

## 2012-03-27 LAB — PREGNANCY, URINE: Preg Test, Ur: NEGATIVE

## 2012-03-27 MED ORDER — HYDROXYZINE HCL 25 MG PO TABS
25.0000 mg | ORAL_TABLET | Freq: Four times a day (QID) | ORAL | Status: DC | PRN
  Administered 2012-03-28 – 2012-04-01 (×3): 25 mg via ORAL

## 2012-03-27 MED ORDER — NICOTINE 21 MG/24HR TD PT24
21.0000 mg | MEDICATED_PATCH | Freq: Every day | TRANSDERMAL | Status: DC
  Filled 2012-03-27: qty 1

## 2012-03-27 MED ORDER — LORAZEPAM 1 MG PO TABS: 1.0000 mg | ORAL_TABLET | Freq: Three times a day (TID) | ORAL | Status: DC | PRN

## 2012-03-27 MED ORDER — ACETAMINOPHEN 325 MG PO TABS: 650.0000 mg | ORAL_TABLET | Freq: Four times a day (QID) | ORAL | Status: DC | PRN

## 2012-03-27 MED ORDER — TRAZODONE HCL 50 MG PO TABS
50.0000 mg | ORAL_TABLET | Freq: Every evening | ORAL | Status: DC | PRN
  Administered 2012-03-28 – 2012-03-29 (×3): 50 mg via ORAL
  Filled 2012-03-27 (×11): qty 1

## 2012-03-27 MED ORDER — ALUM & MAG HYDROXIDE-SIMETH 200-200-20 MG/5ML PO SUSP: 30.0000 mL | ORAL | Status: DC | PRN

## 2012-03-27 MED ORDER — MAGNESIUM HYDROXIDE 400 MG/5ML PO SUSP: 30.0000 mL | Freq: Every day | ORAL | Status: DC | PRN

## 2012-03-27 NOTE — ED Notes (Signed)
ACT team at bedside.  

## 2012-03-27 NOTE — ED Provider Notes (Addendum)
History     CSN: [id number]  Arrival date & time 03/27/12  0009   First MD Initiated Contact with Patient 03/27/12 0140      Chief Complaint  Patient presents with  . detox     (Consider location/radiation/quality/duration/timing/severity/associated sxs/prior treatment) The history is provided by the patient. History Limited By: Pt present to the ed for detox from multiple substances.  Substance abuse has been long standing.  NO aggrevating or releivign factors.  no other symptoms.  No suidical or homicidal ideation.    Past Medical History  Diagnosis Date  . Depression     History reviewed. No pertinent past surgical history.  No family history on file.  History  Substance Use Topics  . Smoking status: Current Some Day Smoker  . Smokeless tobacco: Not on file  . Alcohol Use: Yes    OB History    Grav Para Term Preterm Abortions TAB SAB Ect Mult Living                  Review of Systems  Allergies  Review of patient's allergies indicates no known allergies.  Home Medications   Current Outpatient Rx  Name Route Sig Dispense Refill  . FLUOXETINE HCL 20 MG PO CAPS Oral Take 20 mg by mouth every morning.        BP 110/57  Pulse 77  Temp 98.2 F (36.8 C) (Oral)  Resp 16  SpO2 97%  LMP 03/11/2012  Physical Exam  ED Course  Procedures (including critical care time)  Labs Reviewed  URINALYSIS, ROUTINE W REFLEX MICROSCOPIC - Abnormal; Notable for the following:    APPearance CLOUDY (*)     Leukocytes, UA SMALL (*)     All other components within normal limits  CBC WITH DIFFERENTIAL - Abnormal; Notable for the following:    RBC 3.73 (*)     HCT 34.9 (*)     Neutrophils Relative 34 (*)     Neutro Abs 1.6 (*)     Lymphocytes Relative 51 (*)     All other components within normal limits  COMPREHENSIVE METABOLIC PANEL - Abnormal; Notable for the following:    Glucose, Bld 107 (*)     Total Bilirubin 0.2 (*)     GFR calc non Af Amer 74 (*)     GFR  calc Af Amer 85 (*)     All other components within normal limits  URINE RAPID DRUG SCREEN (HOSP PERFORMED) - Abnormal; Notable for the following:    Cocaine POSITIVE (*)     Tetrahydrocannabinol POSITIVE (*)     All other components within normal limits  URINE MICROSCOPIC-ADD ON - Abnormal; Notable for the following:    Squamous Epithelial / LPF MANY (*)     Bacteria, UA FEW (*)     All other components within normal limits  PREGNANCY, URINE  ETHANOL   No results found.   No diagnosis found.    MDM  + polysubstance abuse.  Will submit to bhh for eval        Chionesu Lytle Michaels, MD 03/27/12 [phone]  Patient was accepted at [facility] by Dr. Allena Katz.  Dione Booze, MD 03/27/12 [phone]

## 2012-03-27 NOTE — Progress Notes (Signed)
Patient ID: Natasha Byrd, female   DOB: 1986/02/20, 26 y.o.   MRN: [id number]  D:  Pt was laying in bed with the cover over most of her head. The writer attempted to verify info given in report, but pt was focused on the temp of the room. Writer adj the temp for but she cont to mention the temp. After some time writer was able to get pt to addr issues at hand. Asked pt how DSS became involved with her. Pt stated she was homeless and someone notified DSS, who put custody of her son with her aunt. Pt wouldn't go into any detail except to say, "he's not in the system he's with my aunt".   A:  Encouragement and support was offered. 15 min checks were continued for safety.  R:  Pt remains safe.

## 2012-03-27 NOTE — Progress Notes (Signed)
[facility] Group Notes:  (Counselor/Nursing/MHT/Case Management/Adjunct)  03/27/2012 8:39 PM  Type of Therapy:  AA Meeting  Participation Level:  Did Not Attend  Participation Quality:  Did not attend  Affect:  Appropriate  Cognitive:  Appropriate  Insight:  Did not attend  Engagement in Group:  did not attend  Engagement in Therapy:  did not attend  Modes of Intervention:  Problem-solving and Support  Summary of Progress/Problems:   Natasha Byrd 03/27/2012, 8:39 PMThe focus of this group is to help patients review their daily goal of treatment and discuss progress on daily workbooks.

## 2012-03-27 NOTE — ED Notes (Signed)
[facility] requested.

## 2012-03-27 NOTE — BH Assessment (Signed)
Assessment Note   Natasha Byrd is an 26 y.o. female.  Pt came to [facility] seeking detox from xanax, marijuana and cocaine.  She said that yesterday she also shot up heroin for the first time.  Pt has been using "those small pills" of xanax, 5 pills at a time daily for over the last 6 months.  Her last use of xanax was 08/13 and she used 5 pills.  She is also smoking 2-3 blunts of marijuana daily for the last few years.  Cocaine use is a quarter gram daily.  Patient also said that she is depressed and feels that life is not worth living however she denies any suicide plan or intention.  Pt also denies HI or A/V hallucinations.  At this time she has no outpatient provider.  She wants to eventually be able to get her 47 year old son back.  Patient is requesting detox then to go to a rehabilitation bed somewhere. Axis I: 304.00 Opioid dependence Axis II: Deferred Axis III:  Past Medical History  Diagnosis Date  . Depression    Axis IV: economic problems, occupational problems and problems with primary support group Axis V: 31-40 impairment in reality testing  Past Medical History:  Past Medical History  Diagnosis Date  . Depression     History reviewed. No pertinent past surgical history.  Family History: No family history on file.  Social History:  reports that she has been smoking.  She does not have any smokeless tobacco history on file. She reports that she drinks alcohol. She reports that she uses illicit drugs (Marijuana and Cocaine).  Additional Social History:  Alcohol / Drug Use Pain Medications: Abusing xanax  Prescriptions: None Over the Counter: None History of alcohol / drug use?: Yes Withdrawal Symptoms: Nausea / Vomiting;Blackouts;Cramps;Weakness;Irritability Substance #1 Name of Substance 1: Xanax 1 - Age of First Use: First tried one at age 41 1 - Amount (size/oz): "I take 5 of the little pills every day." 1 - Frequency: Daily use 1 - Duration: Over the last 6 months 1  - Last Use / Amount: 08/13  Took 5 pills Substance #2 Name of Substance 2: Marijuana 2 - Age of First Use: Teens 2 - Amount (size/oz): 2-3 blunts per day 2 - Frequency: Daily use 2 - Duration: For over 5 years 2 - Last Use / Amount: 08/13 Smoked 3 blunts. Substance #3 Name of Substance 3: Cocaine 3 - Age of First Use: Teens 3 - Amount (size/oz): Quarter of a gram daily 3 - Frequency: Daily use 3 - Duration: On-going 3 - Last Use / Amount: 08/13  Could not remember the amount  CIWA: CIWA-Ar BP: 110/57 mmHg Pulse Rate: 77  Nausea and Vomiting: mild nausea with no vomiting Tactile Disturbances: none Tremor: no tremor Auditory Disturbances: not present Paroxysmal Sweats: no sweat visible Visual Disturbances: not present Anxiety: mildly anxious Headache, Fullness in Head: very mild Agitation: normal activity Orientation and Clouding of Sensorium: oriented and can do serial additions CIWA-Ar Total: 3  COWS:    Allergies: No Known Allergies  Home Medications:  (Not in a hospital admission)  OB/GYN Status:  Patient's last menstrual period was 03/11/2012.  General Assessment Data Location of Assessment: [facility] ED Living Arrangements: Non-relatives/Friends Can pt return to current living arrangement?: Yes Admission Status: Voluntary Is patient capable of signing voluntary admission?: Yes Transfer from: Acute Hospital Referral Source: Self/Family/Friend     Risk to self Suicidal Ideation: Yes-Currently Present Suicidal Intent: No Is patient  at risk for suicide?: No Suicidal Plan?: No Access to Means: No What has been your use of drugs/alcohol within the last 12 months?: Daily use of substances Previous Attempts/Gestures: No How many times?: 0  Other Self Harm Risks: Abusing substances Triggers for Past Attempts: None known Intentional Self Injurious Behavior: None Family Suicide History: No Recent stressful life event(s): Loss (Comment) (Lost custody of her 34 yr old  son) Persecutory voices/beliefs?: No Depression: Yes Depression Symptoms: Despondent;Guilt;Feeling worthless/self pity;Feeling angry/irritable Substance abuse history and/or treatment for substance abuse?: Yes Suicide prevention information given to non-admitted patients: Not applicable  Risk to Others Homicidal Ideation: No Thoughts of Harm to Others: No Current Homicidal Intent: No Current Homicidal Plan: No Access to Homicidal Means: No Identified Victim: No one History of harm to others?: No Assessment of Violence: None Noted Violent Behavior Description: Pt somnulent  Does patient have access to weapons?: No Criminal Charges Pending?: No Does patient have a court date: No  Psychosis Hallucinations: None noted Delusions: None noted  Mental Status Report Appear/Hygiene: Disheveled Eye Contact: Poor Motor Activity: Psychomotor retardation (Pt sleepy) Speech: Argumentative Level of Consciousness: Drowsy;Irritable Mood: Depressed Affect: Blunted Anxiety Level: None Thought Processes: Coherent;Relevant Judgement: Impaired Orientation: Person;Place;Situation Obsessive Compulsive Thoughts/Behaviors: None  Cognitive Functioning Concentration: Decreased Memory: Recent Impaired;Remote Intact IQ: Average Insight: Poor Impulse Control: Poor Appetite: Fair Weight Loss: 0  Weight Gain: 0  Sleep: No Change Total Hours of Sleep: 5  Vegetative Symptoms: None  ADLScreening [facility] Assessment Services) Patient's cognitive ability adequate to safely complete daily activities?: Yes Patient able to express need for assistance with ADLs?: Yes Independently performs ADLs?: Yes (appropriate for developmental age)  Abuse/Neglect [facility]) Physical Abuse: Denies Verbal Abuse: Denies Sexual Abuse: Denies  Prior Inpatient Therapy Prior Inpatient Therapy: Yes Prior Therapy Dates: 3 years ago Prior Therapy Facilty/Provider(s): [facility] Reason for Treatment: SI, SA  Prior Outpatient  Therapy Prior Outpatient Therapy: No Prior Therapy Dates: None Prior Therapy Facilty/Provider(s): None Reason for Treatment: None  ADL Screening (condition at time of admission) Patient's cognitive ability adequate to safely complete daily activities?: Yes Patient able to express need for assistance with ADLs?: Yes Independently performs ADLs?: Yes (appropriate for developmental age) Weakness of Legs: None Weakness of Arms/Hands: None  Home Assistive Devices/Equipment Home Assistive Devices/Equipment: None    Abuse/Neglect Assessment (Assessment to be complete while patient is alone) Physical Abuse: Denies Verbal Abuse: Denies Sexual Abuse: Denies Exploitation of patient/patient's resources: Denies Self-Neglect: Denies Values / Beliefs Cultural Requests During Hospitalization: None Spiritual Requests During Hospitalization: None   Advance Directives (For Healthcare) Advance Directive: Patient does not have advance directive;Patient would not like information    Additional Information 1:1 In Past 12 Months?: No CIRT Risk: No Elopement Risk: No Does patient have medical clearance?: Yes     Disposition:  Disposition Disposition of Patient: Inpatient treatment program;Referred to Type of inpatient treatment program: Adult Patient referred to:  (Referred to [facility] for detox)  On Site Evaluation by:   Reviewed with Physician:     Alexandria Lodge 03/27/2012 5:54 AM

## 2012-03-27 NOTE — ED Notes (Signed)
Spoke with pt during shift change and Amy Charity fundraiser. Pt denying SI or HI at this time. Pt has no plan.

## 2012-03-27 NOTE — BH Assessment (Signed)
BHH Assessment Progress Note      Pt has been accepted to [facility] to [facility], to bed 304.02.   All paperwork completed and faxed to [facility].  Dr. Bernette Mayers and nursing staff notified and agreeable with transfer, which can take place after 1730.  Awaiting transfer.

## 2012-03-27 NOTE — ED Notes (Signed)
Patient made phone call to her family to talk with her son. Patient crying and wants to go home. Patient advised that we are how waiting for results of telepsych.

## 2012-03-27 NOTE — ED Notes (Signed)
Pt sleeping ,audible snoring.

## 2012-03-27 NOTE — Progress Notes (Signed)
Patient ID: Natasha Byrd, female   DOB: 1986-08-13, 26 y.o.   MRN: [id number] RN Admission Note: Patient admitted voluntarily for detox from cocaine and THC. Patient states that she has been using approximately $200 of cocaine daily "for a long time" but was unable to verify [facility] mount used "I get it from people."  Patient has a 79 year old son who was removed from her custody by DSS and placed with an Aunt. Patient states that she had been living with "my homegirl" but is not sure that she can return. Patient reports good physical health, no surgeries, and NKDA. Patient's admission CIWA was a 1 and her COWS was a 2. Presents as flat and depressed, irritable with no eye contact. Reports no support from biological family. She lists mother and grandmother as family and states that they have a history of substance abuse. Patient admissted to SI and HI in ED but denies during assessment. Patient oriented to unit, offered food and fluids, shown to room and given access to phone to call "social worker." Patient states that she was taking Zyprexa prescribed from [place] but stopped taking in April. She was unable to verify dosage.

## 2012-03-27 NOTE — Progress Notes (Signed)
Silver colored necklace, cell phone, perfume, striped tote bag, gold colored hoop earrings with beads. Striped tote bag.

## 2012-03-27 NOTE — ED Notes (Signed)
Patient allowed to make phone call home and was upset with family member because she is being sent to [facility]. Patient returned to her room upset.

## 2012-03-27 NOTE — Tx Team (Signed)
Initial Interdisciplinary Treatment Plan  PATIENT STRENGTHS: (choose at least two) Capable of independent living Communication skills Physical Health  PATIENT STRESSORS: Financial difficulties Loss of 24 year old child removed from custody due to SA.* Marital or family conflict Substance abuse   PROBLEM LIST: Problem List/Patient Goals Date to be addressed Date deferred Reason deferred Estimated date of resolution  Substance Abuse 03/27/2012    D/C  Depression 03/27/2012    D/C  Potential for Self Harm 03/27/2012    D/C                                       DISCHARGE CRITERIA:  Adequate post-discharge living arrangements Improved stabilization in mood, thinking, and/or behavior Motivation to continue treatment in a less acute level of care Verbal commitment to aftercare and medication compliance Withdrawal symptoms are absent or subacute and managed without 24-hour nursing intervention  PRELIMINARY DISCHARGE PLAN: Attend 12-step recovery group Outpatient therapy Return to previous living arrangement  PATIENT/FAMIILY INVOLVEMENT: This treatment plan has been presented to and reviewed with the patient, Natasha Byrd.  The patient and family have been given the opportunity to ask questions and make suggestions.  Angelic Schnelle 03/27/2012, 7:07 PM

## 2012-03-27 NOTE — ED Notes (Signed)
The pt wants to be detoxed from cocaine and pot.  She last used today.  Alcohol also.  Her last drink was  1157m.  lmp July end

## 2012-03-28 DIAGNOSIS — F909 Attention-deficit hyperactivity disorder, unspecified type: Secondary | ICD-10-CM

## 2012-03-28 MED ORDER — CITALOPRAM HYDROBROMIDE 20 MG PO TABS
20.0000 mg | ORAL_TABLET | Freq: Every day | ORAL | Status: DC
  Administered 2012-03-28 – 2012-04-01 (×5): 20 mg via ORAL
  Filled 2012-03-28 (×6): qty 1
  Filled 2012-03-28: qty 14
  Filled 2012-03-28: qty 1

## 2012-03-28 NOTE — Discharge Planning (Signed)
New patient attended AM group, good participation.  Stated it was her DSS caseworker's idea for her to come.  24 yo son is in custody of aunt.  Wants to get into rehab from here.  Specifically requested [facility].  Got her an appointment for Tues., 8/20.  No detox.  Asked where she would stay until that appointment.  She did not have an immediate answer.  Stated she was here for a week the last time, and was planning on staying that long again.

## 2012-03-28 NOTE — Progress Notes (Signed)
03/28/2012         Time: 1500      Group Topic/Focus: The focus of this group is on enhancing the patient's understanding of leisure, barriers to leisure, and the importance of engaging in positive leisure activities upon discharge for improved total health.  Participation Level: Did not attend  Participation Quality: Not Applicable  Affect: Not Applicable  Cognitive: Not Applicable   Additional Comments: Patient in bed.    Breawna Montenegro 03/28/2012 3:51 PM  

## 2012-03-28 NOTE — Treatment Plan (Signed)
Interdisciplinary Treatment Plan Update (Adult)  Date: 03/28/2012  Time Reviewed: 5:12 PM   Progress in Treatment: Attending groups: Yes Participating in groups: Yes Taking medication as prescribed: Yes Tolerating medication: Yes   Family/Significant other contact made:  No Patient understands diagnosis:  Yes  As evidenced by asking for help with depression, getting into rehab from here Discussing patient identified problems/goals with staff:  Yes  See below Medical problems stabilized or resolved:  Yes Denies suicidal/homicidal ideation: Yes  With Dr Issues/concerns per patient self-inventory:  Yes  Rates depression and hopelessness at 7  Multiple physical complaints Other:  New problem(s) identified: N/A  Reason for Continuation of Hospitalization: Depression Medication stabilization  Interventions implemented related to continuation of hospitalization:  Celexa trial,  Encourage group attendance and participation  Additional comments:  Estimated length of stay: 3 days Discharge Plan:Stay with cousin until [facility] screening on 8/20 New goal(s): N/A  Review of initial/current patient goals per problem list:   1.  Goal(s):Eliminate SI  Met:  Yes  Target date:8/15  As evidenced [id number] report  2.  Goal (s): Decrease depression  Met:  No  Target date:8/18  As evidenced [id number] will rate her depression at 4 or less  3.  Goal(s):Identify comprehensive sobriety plan  Met:  Yes  Target date:8/15  As evidenced [id number] referral to [facility] rehab  4.  Goal(s):  Met:  No  Target date:  As evidenced by:  Attendees: Patient:     Family:     Physician:  [name] 03/28/2012 5:12 PM   Nursing:    03/28/2012 5:12 PM   Case Manager:  Richelle Ito, LCSW 03/28/2012 5:12 PM   Counselor:   03/28/2012 5:12 PM   Other:     Other:     Other:     Other:      Scribe for Treatment Team:   Ida Rogue, 03/28/2012 5:12 PM

## 2012-03-28 NOTE — Progress Notes (Signed)
[facility] Group Notes:  (Counselor/Nursing/MHT/Case Management/Adjunct)  03/28/2012 8:47 PM  Type of Therapy:  Group Therapy  Participation Level:  Did Not Attend   Natasha Byrd 03/28/2012, 8:47 PM

## 2012-03-28 NOTE — Progress Notes (Signed)
Patient's self inventory sheet, patient has fair sleep, poor appetite, low energy level, good attention span.   Rated depression and hopelessness #7.  Has experienced withdrawals of chilling, cravings in past 24 hours.  SI off/on, contracts for safety.   Physical problems in past 24 hours are lightheaded, dizziness.  Pain goal today #5, worst pain #7.  After discharge, plans to stop drugs at job and school.  Patient did not attend one morning group and did not attend afternoon group.   Patient was using cocaine and THC prior to admission at [facility].   Has appointment on Tuesday with [facility].
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
## 2012-03-28 NOTE — Progress Notes (Signed)
Psychoeducational Group Note  Date:  03/28/2012 Time:  1100  Group Topic/Focus:  Overcoming Stress:   The focus of this group is to define stress and help patients assess their triggers.  Participation Level: Did Not Attend  Participation Quality:  Not Applicable  Affect:  Not Applicable  Cognitive:  Not Applicable  Insight:  Not Applicable  Engagement in Group: Not Applicable  Additional Comments:  Pt remained in bed resting while this group was being conducted.   Sharyn Lull 03/28/2012, 11:41 AM

## 2012-03-29 MED ORDER — NICOTINE 21 MG/24HR TD PT24
21.0000 mg | MEDICATED_PATCH | Freq: Every day | TRANSDERMAL | Status: DC
  Administered 2012-03-29: 21 mg via TRANSDERMAL
  Filled 2012-03-29 (×5): qty 1

## 2012-03-29 NOTE — Progress Notes (Signed)
D-Patient is out in milieu interacting with peers and attending groups.  A- Rates depression at 2 and hopelessness at 1. Rates depression at 2 and hopelessness at 1.  Positive participation in treatment team.  Denies SI.  R- Support and encouragement given. Continue current POC and evaluation of treatment goals.  Continue 15' checks for safety.

## 2012-03-29 NOTE — Progress Notes (Signed)
Patient did not attend the evening Karaoke group.

## 2012-03-29 NOTE — Progress Notes (Signed)
Counseling Group  03/29/2012 2:42 PM  Type of Therapy:  Group Therapy at 1:15 to 2:30  Participation Level:  Active  Participation Quality:  Appropriate  Affect:  Appropriate  Cognitive:  Alert and Oriented  Insight:  Good  Engagement in Group:  Good  Engagement in Therapy:  Limited  Modes of Intervention:  Clarification, Orientation, Socialization and Support  Summary of Progress/Problems: Group session included an educational portion on Post Acute Withdrawal Syndrome (PAWS) and a processing portion on feelings about relapse and what, if anything, is the motive for recovery.  Natasha Byrd shared her desire to be clean for her son and willingness to get involved in recovery community. Several group members were frustrated and angry and needed to vent yet patient choose not to give input.     Natasha Byrd 03/29/2012, 3:31 PM
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
## 2012-03-29 NOTE — Progress Notes (Signed)
Pt went to group this pm and now is in her room. Pt requested trazadone 50mg  and stated she may need another one in one hour.no SI or HI and contracts for safety.
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
## 2012-03-29 NOTE — Discharge Planning (Signed)
Confirmed in tx team that pt would d/c Sunday.  Will stay with cousin until Tues AM, when DSS caseworker will deliver her to [facility] rehab.

## 2012-03-29 NOTE — Progress Notes (Signed)
[facility] MD Progress Note  03/29/2012 4:27 PM  Current Mental Status: Patient seen and evaluated. Chart reviewed. Patient stated that her mood was "better". Her affect was mood congruent and bright. She denied any current thoughts of self injurious behavior, suicidal ideation or homicidal ideation. There were no auditory or visual hallucinations, paranoia, delusional thought processes, or mania noted. Thought process was linear and goal directed. No psychomotor agitation or retardation was noted. Speech was normal rate, tone and volume. Eye contact was poor. Judgment and insight are fair. Patient has been up and engaged on the unit. No acute safety concerns reported from team.   Sleep:  Number of Hours: 4.5    Vital Signs:Blood pressure 114/75, pulse 66, temperature 97.6 F (36.4 C), temperature source Oral, resp. rate 24, height 5\' 5"  (1.651 m), weight 67.132 kg (148 lb), last menstrual period 03/11/2012, SpO2 96.00%.  Current Medications: Current Facility-Administered Medications  Medication Dose Route Frequency Provider Last Rate Last Dose  . acetaminophen (TYLENOL) tablet 650 mg  650 mg Oral Q6H PRN Ronny Bacon, MD      . alum & mag hydroxide-simeth (MAALOX/MYLANTA) 200-200-20 MG/5ML suspension 30 mL  30 mL Oral Q4H PRN Curlene Labrum Readling, MD      . citalopram (CELEXA) tablet 20 mg  20 mg Oral Daily Bienvenido Proehl Kuroski-Mazzei, DO   20 mg at 03/29/12 1610  . hydrOXYzine (ATARAX/VISTARIL) tablet 25 mg  25 mg Oral Q6H PRN Curlene Labrum Readling, MD   25 mg at 03/28/12 1704  . magnesium hydroxide (MILK OF MAGNESIA) suspension 30 mL  30 mL Oral Daily PRN Curlene Labrum Readling, MD      . traZODone (DESYREL) tablet 50 mg  50 mg Oral QHS,MR X 1 Curlene Labrum Readling, MD   50 mg at 03/28/12 2137    Lab Results: No results found for this or any previous visit (from the past 48 hour(s)).  Physical Findings: AIMS: Facial and Oral Movements Muscles of Facial Expression: None, normal Lips and Perioral Area: None,  normal Jaw: None, normal Tongue: None, normal,Extremity Movements Upper (arms, wrists, hands, fingers): None, normal Lower (legs, knees, ankles, toes): None, normal, Trunk Movements Neck, shoulders, hips: None, normal, Overall Severity Severity of abnormal movements (highest score from questions above): None, normal Incapacitation due to abnormal movements: None, normal Patient's awareness of abnormal movements (rate only patient's report): No Awareness, Dental Status Current problems with teeth and/or dentures?: No Does patient usually wear dentures?: No  CIWA:  CIWA-Ar Total: 1  COWS:  COWS Total Score: 1   CLINICAL FACTORS: Stimulant Use Disorder (Cocaine); Cannabis Use Disorder; Opioid Use Disorder; r/o PTSD & GAD; PD NOS with Cluster B Traits  Plan: Pt seen and evaluated in treatment team.  Reviewed short term and long term goals, medications, current treatment in the hospital and acute/chronic safety.  Pt denied any current thoughts of self harm, suicidal ideation or homicidal ideation.  Contracted for safety on the unit.  No acute issues noted.  VS reviewed with team.  Pt agreeable with treatment plan, see orders. Continue current medications as noted above.  Pt accepted to [facility] on Tuesday.  Can be discharged Sunday to stay with cousin for 2 nights before transfer to residential Tx.  [name] 03/29/2012, 4:27 PM

## 2012-03-29 NOTE — Progress Notes (Signed)
[facility] Adult Inpatient Family/Significant Other Suicide Prevention Education  Suicide Prevention Education:  Patient Refusal for Family/Significant Other Suicide Prevention Education: The patient Natasha Byrd has refused to provide written consent for family/significant other to be provided Family/Significant Other Suicide Prevention Education during admission and/or prior to discharge. The cousin with whom patient lives does not have a telephone. Physician notified. Writer provided suicide prevention education directly to patient; conversation included risk factors, warning signs and resources to contact for help. Mobile crisis services explained and contact card placed in chart for pt to receive at discharge.   Clide Dales 03/29/2012, 8:56 AM
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
## 2012-03-29 NOTE — Progress Notes (Signed)
Psychoeducational Group Note  Date:  03/29/2012 Time:  1000  Group Topic/Focus:  Relapse Prevention Planning:   The focus of this group is to define relapse and discuss the need for planning to combat relapse.  Participation Level:  Active  Participation Quality:  Appropriate and Attentive  Affect:  Anxious, Depressed and Flat  Cognitive:  Alert, Appropriate and Oriented  Insight:  Good  Engagement in Group:  Good  Additional Comments:  Pt attended psychoeducational skills group. Pt was attentive and sharing, but showed little insight during discussion time.  Orma Render 03/29/2012, 12:19 PM

## 2012-03-29 NOTE — Progress Notes (Signed)
Psychoeducational Group Note  Date:  03/29/2012 Time:  1100  Group Topic/Focus:  Relapse Prevention Planning:   The focus of this group is to define relapse and discuss the need for planning to combat relapse.  Participation Level:  Minimal  Participation Quality:  Intrusive, Inattentive and Monopolizing  Affect:  Angry, Blunted and Depressed  Cognitive:  Inappropriate  Insight:  Limited  Engagement in Group:  Limited  Additional Comments:   Pt attended group but participation was limited. Group discussion continued to focus on wants and demands from [facility] even when this Clinical research associate redirected conversation.    Dalia Heading 03/29/2012, 4:31 PM

## 2012-03-30 DIAGNOSIS — F1310 Sedative, hypnotic or anxiolytic abuse, uncomplicated: Secondary | ICD-10-CM | POA: Diagnosis present

## 2012-03-30 MED ORDER — TRAZODONE HCL 100 MG PO TABS
100.0000 mg | ORAL_TABLET | Freq: Every evening | ORAL | Status: DC | PRN
  Administered 2012-03-30 – 2012-04-01 (×6): 100 mg via ORAL
  Filled 2012-03-30 (×2): qty 1
  Filled 2012-03-30: qty 28
  Filled 2012-03-30 (×4): qty 1
  Filled 2012-03-30: qty 28

## 2012-03-30 NOTE — Progress Notes (Signed)
D- Patient is out in milieu interacting with peers and attending groups with fair participation.  A- Rates depression and hopelessness at 2. Denies SI. Redirection for childlike, interuptive behaviors during group time. Did have some insight into THC abuse, but overall, there is no observable motivation for treatment. R- Support and encouragement given.  Continue current POC and evaluation of treatment goals.  Continue 15' checks for safety.

## 2012-03-30 NOTE — Progress Notes (Signed)
[facility] Group Notes:  (Counselor/Nursing/MHT/Case Management/Adjunct)  03/30/2012 4:05 PM  Type of Therapy:  Group Therapy  Participation Level:  Active  Participation Quality:  Attentive, Sharing and Supportive  Affect:  Anxious and Depressed  Cognitive:  Alert, Appropriate and Oriented  Insight:  Good  Engagement in Group:  Good  Engagement in Therapy:  Good  Modes of Intervention:  Education, Problem-solving and Support  Summary of Progress/Problems: Pt present in group and participated in discharge planning and discussion about Suicide Prevention. Pt reported that she is a 3 in overall coping on scale from 1-5. Pt stated that she wants to do good but feels tempted to continue to use. Pt expressed worry that she will use again due to the home she is discharging to is "the drug house". She currently has no where else to go. Pt invested in going to rehab but is concerned about getting to rehab and does not have transportation to facility needs bus pass. Pt reported that she has SI at times because she feels that she has let her son down and this causes her to want to kill herself. Pt was able to contract for safety and stated that she wouldn't do it because of not wanting to leave her son.    Hurley Cisco, MSW, LCSW 03/30/2012, 4:05 PM

## 2012-03-30 NOTE — Progress Notes (Signed)
Patient ID: Natasha Byrd, female   DOB: July 25, 1986, 26 y.o.   MRN: [id number] [facility] MD Progress Note  03/30/2012 12:04 PM   Patient seen and evaluated. Chart reviewed. Patient stated that her mood is getting betting. Poor sleep now. No place to go and waiting for rehab placement.   Current Mental Status:. Her affect was mood congruent. She denied any current thoughts of self injurious behavior, suicidal ideation or homicidal ideation. There were no auditory or visual hallucinations, paranoia, delusional thought processes, or mania noted. Thought process was linear and goal directed. No psychomotor agitation or retardation was noted. Speech was normal rate, tone and volume. Eye contact was poor. Judgment and insight are fair. Patient has been up and engaged on the unit. No acute safety concerns reported from team.   Sleep:  Number of Hours: 6    Vital Signs:Blood pressure 108/72, pulse 71, temperature 98 F (36.7 C), temperature source Oral, resp. rate 16, height 5\' 5"  (1.651 m), weight 67.132 kg (148 lb), last menstrual period 03/11/2012, SpO2 96.00%.  Current Medications: Current Facility-Administered Medications  Medication Dose Route Frequency Provider Last Rate Last Dose  . acetaminophen (TYLENOL) tablet 650 mg  650 mg Oral Q6H PRN Ronny Bacon, MD      . alum & mag hydroxide-simeth (MAALOX/MYLANTA) 200-200-20 MG/5ML suspension 30 mL  30 mL Oral Q4H PRN Curlene Labrum Readling, MD      . citalopram (CELEXA) tablet 20 mg  20 mg Oral Daily Alyson Kuroski-Mazzei, DO   20 mg at 03/30/12 0800  . hydrOXYzine (ATARAX/VISTARIL) tablet 25 mg  25 mg Oral Q6H PRN Curlene Labrum Readling, MD   25 mg at 03/28/12 1704  . magnesium hydroxide (MILK OF MAGNESIA) suspension 30 mL  30 mL Oral Daily PRN Curlene Labrum Readling, MD      . nicotine (NICODERM CQ - dosed in mg/24 hours) patch 21 mg  21 mg Transdermal Daily Alyson Kuroski-Mazzei, DO   21 mg at 03/29/12 2007  . traZODone (DESYREL) tablet 100 mg  100 mg Oral QHS,MR X  1 Wonda Cerise, MD      . DISCONTD: traZODone (DESYREL) tablet 50 mg  50 mg Oral QHS,MR X 1 Curlene Labrum Readling, MD   50 mg at 03/29/12 2243    Lab Results: No results found for this or any previous visit (from the past 48 hour(s)).  Physical Findings: AIMS: Facial and Oral Movements Muscles of Facial Expression: None, normal Lips and Perioral Area: None, normal Jaw: None, normal Tongue: None, normal,Extremity Movements Upper (arms, wrists, hands, fingers): None, normal Lower (legs, knees, ankles, toes): None, normal, Trunk Movements Neck, shoulders, hips: None, normal, Overall Severity Severity of abnormal movements (highest score from questions above): None, normal Incapacitation due to abnormal movements: None, normal Patient's awareness of abnormal movements (rate only patient's report): No Awareness, Dental Status Current problems with teeth and/or dentures?: No Does patient usually wear dentures?: No  CIWA:  CIWA-Ar Total: 1  COWS:  COWS Total Score: 1   CLINICAL FACTORS: Stimulant Use Disorder (Cocaine); Cannabis Use Disorder; Opioid Use Disorder; r/o PTSD & GAD; PD NOS with Cluster B Traits  Plan:   Will increase trzodone to 100 mg qhs for sleep  Wonda Cerise 03/30/2012, 12:04 PM

## 2012-03-30 NOTE — Progress Notes (Signed)
[facility] Group Notes:  (Counselor/Nursing/MHT/Case Management/Adjunct)  03/30/2012 5:43 PM  Type of Therapy:  Group Therapy  Participation Level:  Active  Participation Quality:  Appropriate, Attentive, Sharing and Supportive  Affect:  Anxious and Depressed  Cognitive:  Alert, Appropriate and Oriented  Insight:  Good  Engagement in Group:  Good  Engagement in Therapy:  Good  Modes of Intervention:  Education, Problem-solving, Support and development of coping skills and addressing self sabatoging behaviors.   Summary of Progress/Problems: Pt present and participated in group. Pt demonstrated insight into what she needs to do to stay successfully in recovery but had difficulty taking responsibility for it being her that needed to do the work not others. Pt was able to identify the self-sabatoging behviors that she engages in especially feeling you have no value and having no purpose. Pt able to identify ways that she could overcome these thoughts and feelings in order to be more successful in treatment. Pt identified positive coping skills including prayer, learning to talk more and open up when something is bothering her and also focusing on not continuing the cycle of substance abuse for her son. Pt was able to recognize that she needs to have positive people around her and not continue to have people who use in her support circle.   Berlin Hun 03/30/2012, 5:43 PM

## 2012-03-30 NOTE — Progress Notes (Signed)
[facility] Group Notes:  (Counselor/Nursing/MHT/Case Management/Adjunct)  03/30/2012 6:40 PM  Type of Therapy:  Psychoeducational Skills  Participation Level:  Minimal  Participation Quality:  Intrusive, Monopolizing and Redirectable  Affect:  Angry and Irritable  Cognitive:  Alert  Insight:  Limited  Engagement in Group:  Limited  Engagement in Therapy:  Limited  Modes of Intervention:  Activity, Education and Socialization  Summary of Progress/Problems:Group played coping skills Pictionary, discussing the benefits of coping skills and how they are used outside of the treatment facility.     Dalia Heading 03/30/2012, 6:40 PM

## 2012-03-30 NOTE — Progress Notes (Signed)
Psychoeducational Group Note Date:  03/29/2012 Time:  2000  Group Topic/Focus:  AA  Participation Level:  Active  Participation Quality:  Appropriate  Affect:  Appropriate  Cognitive:  Appropriate  Insight:  Good  Engagement in Group:  Good  Additional Comments:    Cayson Kalb R 03/30/2012, 12:02 AM

## 2012-03-30 NOTE — Progress Notes (Signed)
Patient did attend this evenings speaker AA meeting. 

## 2012-03-30 NOTE — Progress Notes (Signed)
Patient ID: Natasha Byrd, female   DOB: 08-09-1986, 26 y.o.   MRN: [id number]   D: Patient lying in bed. Appears to be sleeping. Respirations even and non-labored. A: Staff will monitor on q 15 minute checks and follow medication and treatment orders as prescibed. R: No response from patient at this time.

## 2012-03-30 NOTE — Progress Notes (Addendum)
D.  Pt anxious regarding lab results, states that she was told results would be available in one hour.  Pt tested for HIV, Hepatitis, and STDs and fearful of results.  Pt also upset earlier that father would not allow her to speak on the phone to her child.  Pt was able to calm down and did go to evening AA group.  She came out of group once to ask if lab results were in fact back.  Denies SI/HI/hallucinations at this time.  Interacting appropriately on unit.  A.  Explained that there is no set time for lab results to be back, and that results are currently in process.  Support and encouragement offered. R.  Pt returned to group, will continue to monitor.  Called lab for Pt and learned that her results will not be back until tomorrow afternoon.  Let Pt know this information.

## 2012-03-30 NOTE — H&P (Signed)
Pt seen and evaluated upon admission.  Completed Admission Suicide Risk Assessment.  See orders.  Pt agreeable with plan.  Discussed with team.   

## 2012-03-31 DIAGNOSIS — F1210 Cannabis abuse, uncomplicated: Secondary | ICD-10-CM

## 2012-03-31 DIAGNOSIS — F411 Generalized anxiety disorder: Secondary | ICD-10-CM

## 2012-03-31 DIAGNOSIS — F1010 Alcohol abuse, uncomplicated: Secondary | ICD-10-CM

## 2012-03-31 DIAGNOSIS — F1310 Sedative, hypnotic or anxiolytic abuse, uncomplicated: Secondary | ICD-10-CM

## 2012-03-31 DIAGNOSIS — F1410 Cocaine abuse, uncomplicated: Principal | ICD-10-CM

## 2012-03-31 LAB — HIV ANTIBODY (ROUTINE TESTING W REFLEX): HIV: NONREACTIVE

## 2012-03-31 LAB — RPR: RPR Ser Ql: NONREACTIVE

## 2012-03-31 LAB — HEPATITIS PANEL, ACUTE
HCV Ab: NEGATIVE
Hepatitis B Surface Ag: NEGATIVE

## 2012-03-31 NOTE — Progress Notes (Signed)
Pt reports that she does not have a place to go until she can get into [facility] on Tues for she can no longer go to stay with her cousin. Pt reports the only place she can go is where she "used to get her drugs". Pt is worried she will relapse if not at [facility], D/C is not planned for today.

## 2012-03-31 NOTE — Progress Notes (Signed)
Natasha Byrd was heard cussing at other patients and asking peers to become mad at other patients in the day room. Natasha Byrd was redirected and claimed that staff is against her because they are racist. Nurse was notified of behavior and intervened.

## 2012-03-31 NOTE — Progress Notes (Signed)
D- Patient is out in milieu interacting with peers and attending groups.  A- Bright and cheerful affect. "Off and on SI" and contracts for safety.  Rates depression at 3 and hopelessness at 5. Behavior is incongruent with stated mood.  No physiacl complaints and no prn medications requested. Lab results shared per patient request.  R- Support and encouragement given. Continue current POC and evaluation of treatment goals. Continue 15' checks for safety.

## 2012-03-31 NOTE — Progress Notes (Signed)
A.  Pt pleasant on approach, in 300 med room interacting with peers on approach.  No complaints voiced.  Denies SI/HI/hallucinations.  Positive for evening AA group.  No prn medications required.  A.  Support and encouragement offered, will continue to monitor.  R.  Pt continuing to interact with peers, no distress noted.

## 2012-03-31 NOTE — Progress Notes (Signed)
Patient ID: Natasha Byrd, female   DOB: Apr 26, 1986, 26 y.o.   MRN: [id number]   [facility] Group Notes:  (Counselor/Nursing/MHT/Case Management/Adjunct)  03/31/2012 1:15 PM  Type of Therapy:  Group Therapy, Dance/Movement Therapy   Participation Level:  Active  Participation Quality:  Redirectable  Affect:  Irritable  Cognitive:  Appropriate  Insight:  Limited  Engagement in Group:  Good  Engagement in Therapy:  Good  Modes of Intervention:  Clarification, Problem-solving, Role-play, Socialization and Support  Summary of Progress/Problems: Therapist began group by inviting group members to share where they saw themselves in 90 days. Therapist and group members discussed the difference between dependence and abuse and types of support systems we need for recovery. Pt attempted to make excuses about her AA/NA experiences and stated that she does not need help or supports because she can do it on her own.     Cassidi Long 03/31/2012. 2:51 PM

## 2012-03-31 NOTE — Progress Notes (Signed)
Patient ID: Natasha Byrd, female   DOB: 1985/09/05, 26 y.o.   MRN: [id number]  Pt. attended and participated in aftercare planning group. Pt. verbally accepted information on suicide prevention, warning signs to look for with suicide and crisis line numbers to use. The pt. agreed to call crisis line numbers if having warning signs or having thoughts of suicide. Pt. listed their current anxiety level as 5 and their current depression level as 5.

## 2012-03-31 NOTE — Progress Notes (Signed)
Patient ID: Natasha Byrd, female   DOB: 1985/09/12, 26 y.o.   MRN: [id number] [facility] MD Progress Note  03/31/2012 4:02 PM   Patient seen and evaluated. Chart reviewed. Patient stated that her mood is getting betting. Sleep better now. No place to go and waiting for rehab placement.   Current Mental Status:. Her affect was mood congruent. She denied any current thoughts of self injurious behavior, suicidal ideation or homicidal ideation. There were no auditory or visual hallucinations, paranoia, delusional thought processes, or mania noted. Thought process was linear and goal directed. No psychomotor agitation or retardation was noted. Speech was normal rate, tone and volume. Eye contact was poor. Judgment and insight are fair. Patient has been up and engaged on the unit. No acute safety concerns reported from team.   Sleep:  Number of Hours: 6    Vital Signs:Blood pressure 105/73, pulse 83, temperature 97.8 F (36.6 C), temperature source Oral, resp. rate 20, height 5\' 5"  (1.651 m), weight 67.132 kg (148 lb), last menstrual period 03/11/2012, SpO2 96.00%.  Current Medications: Current Facility-Administered Medications  Medication Dose Route Frequency Provider Last Rate Last Dose  . acetaminophen (TYLENOL) tablet 650 mg  650 mg Oral Q6H PRN Ronny Bacon, MD      . alum & mag hydroxide-simeth (MAALOX/MYLANTA) 200-200-20 MG/5ML suspension 30 mL  30 mL Oral Q4H PRN Curlene Labrum Readling, MD      . citalopram (CELEXA) tablet 20 mg  20 mg Oral Daily Alyson Kuroski-Mazzei, DO   20 mg at 03/31/12 0744  . hydrOXYzine (ATARAX/VISTARIL) tablet 25 mg  25 mg Oral Q6H PRN Curlene Labrum Readling, MD   25 mg at 03/28/12 1704  . magnesium hydroxide (MILK OF MAGNESIA) suspension 30 mL  30 mL Oral Daily PRN Curlene Labrum Readling, MD      . nicotine (NICODERM CQ - dosed in mg/24 hours) patch 21 mg  21 mg Transdermal Daily Alyson Kuroski-Mazzei, DO   21 mg at 03/29/12 2007  . traZODone (DESYREL) tablet 100 mg  100 mg Oral QHS,MR  X 1 Wonda Cerise, MD   100 mg at 03/30/12 2225    Lab Results:  Results for orders placed during the hospital encounter of 03/27/12 (from the past 48 hour(s))  RPR     Status: Normal   Collection Time   03/30/12  7:25 PM      Component Value Range Comment   RPR NON REACTIVE  NON REACTIVE   HIV ANTIBODY (ROUTINE TESTING)     Status: Normal   Collection Time   03/30/12  7:25 PM      Component Value Range Comment   HIV NON REACTIVE  NON REACTIVE   HEPATITIS PANEL, ACUTE     Status: Normal   Collection Time   03/30/12  7:25 PM      Component Value Range Comment   Hepatitis B Surface Ag NEGATIVE  NEGATIVE    HCV Ab NEGATIVE  NEGATIVE    Hep A IgM NEGATIVE  NEGATIVE    Hep B C IgM NEGATIVE  NEGATIVE     Physical Findings: AIMS: Facial and Oral Movements Muscles of Facial Expression: None, normal Lips and Perioral Area: None, normal Jaw: None, normal Tongue: None, normal,Extremity Movements Upper (arms, wrists, hands, fingers): None, normal Lower (legs, knees, ankles, toes): None, normal, Trunk Movements Neck, shoulders, hips: None, normal, Overall Severity Severity of abnormal movements (highest score from questions above): None, normal Incapacitation due to abnormal movements: None, normal Patient's awareness  of abnormal movements (rate only patient's report): No Awareness, Dental Status Current problems with teeth and/or dentures?: No Does patient usually wear dentures?: No  CIWA:  CIWA-Ar Total: 4  COWS:  COWS Total Score: 1   CLINICAL FACTORS: Stimulant Use Disorder (Cocaine); Cannabis Use Disorder; Opioid Use Disorder; r/o PTSD & GAD; PD NOS with Cluster B Traits  Plan:   Pending rehab transfer as has no place to live before rehab Continue current med  Wonda Cerise 03/31/2012, 4:02 PM

## 2012-03-31 NOTE — Progress Notes (Signed)
[facility] Group Notes:  (Counselor/Nursing/MHT/Case Management/Adjunct)  03/31/2012 7:33 PM  Type of Therapy:  Psychoeducational Skills  Participation Level:  Minimal  Participation Quality:  Inattentive and Resistant  Affect:  Irritable  Cognitive:  Alert, Appropriate and Oriented  Insight:  None  Engagement in Group:  None  Engagement in Therapy:  n/a  Modes of Intervention:  Education, Limit-setting and Problem-solving  Summary of Progress/Problems:  Shyana attended [facility] orientation group. Oliviana was talking over peers and challenging [facility] rules. Vilma attempted to follow peer out of Group who was extremely disrespectful to group, but Errika was redirected and choose to stay in group.   Wandra Scot 03/31/2012, 7:33 PM

## 2012-04-01 MED ORDER — CITALOPRAM HYDROBROMIDE 20 MG PO TABS: 20.0000 mg | ORAL_TABLET | Freq: Every day | ORAL | Status: DC

## 2012-04-01 MED ORDER — TRAZODONE HCL 100 MG PO TABS: 100.0000 mg | ORAL_TABLET | Freq: Every evening | ORAL | Status: DC | PRN

## 2012-04-01 NOTE — Progress Notes (Signed)
Patient ID: Natasha Byrd, female   DOB: September 17, 1985, 26 y.o.   MRN: [id number]  D:  Pt was laying on her back in the hall with a leg propped up on the bar,laughing and being loud. Writer informed pt to sit up "like a lady", then asked pt to come for her assessment. Writer attempted to encourage pt to take "treatment serious". Attempted to discourage the type of interaction that had been witnessed regarding boundaries with her peers. Writer asked the pt if she had any concerns or complaints about her care. "No", and got up left the room.  A:  Support and encouragement was offered. 15 min checks are continued for safety.   R:  Pt remains safe.

## 2012-04-01 NOTE — Progress Notes (Signed)
[facility] Case Management Discharge Plan:  Will you be returning to the same living situation after discharge: No. At discharge, do you have transportation home?:Yes,  DSS worker Do you have the ability to pay for your medications:Yes,  mental health  Interagency Information:     Release of information consent forms completed and in the chart;  Patient's signature needed at discharge.  Patient to Follow up at:  Follow-up Information    Follow up with [facility] on 04/02/2012. (Be there at 8AM sharp)    Contact information:   [address] [facility]         Patient denies SI/HI:   Yes,  yes    Aeronautical engineer and Suicide Prevention discussed:  Yes,  yes  Barrier to discharge identified:No.  Summary and Recommendations:   Natasha Byrd 04/01/2012, 10:27 AM

## 2012-04-01 NOTE — Progress Notes (Signed)
Psychoeducational Group Note  Date:  04/01/2012 Time: 1000  Group Topic/Focus:  Wellness Toolbox:   The focus of this group is to discuss various aspects of wellness, balancing those aspects and exploring ways to increase the ability to experience wellness.  Patients will create a wellness toolbox for use upon discharge.  Participation Level: Did Not Attend  Participation Quality:  Not Applicable  Affect:  Not Applicable  Cognitive:  Not Applicable  Insight:  Not Applicable  Engagement in Group: Not Applicable  Additional Comments:  Stayed in room  Ralph Leyden 04/01/2012, 12:21 PM
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
## 2012-04-01 NOTE — Discharge Planning (Signed)
Trinisha attended AM group.  Sullen, defiant but redirectable if only for a minute or 2.  When asked why she did not d/c yesterday as planned, she said she did not know, and accused me of having the information I needed.  Plans to d/c from here directly tomorrow to [facility].  States her DSS was already called this AM, and will give her a ride.

## 2012-04-01 NOTE — Progress Notes (Signed)
D states she slept well last nite, appetite is improving and going to DR for meals, energy level is normal and depressed 3/10 and hopeless 4/10, c/o chilling from time to time, denies Si or Hi, wants to stop drugs and go to classes, has not asked for and has not received any prns today, states she got a little sad this morning and has teared up a couple of times today A q56min safety checks continue and support offered, attending group, interacting w/peers in the dayroom R patient remains safe on the unit

## 2012-04-01 NOTE — Progress Notes (Signed)
[facility] Group Notes:  (Counselor/Nursing/MHT/Case Management/Adjunct)    Type of Therapy:  Group Therapy 1:15 to 2:30 PM  Participation Level:  Minimal  Participation Quality:  Drowsy and Intrusive  Affect:  Flat  Cognitive:  Oriented  Insight:  None  Engagement in Group:  None  Engagement in Therapy:  None  Modes of Intervention:  Limit-setting  Summary of Progress/Problems: Group discussion focused on what patient's see as their own obstacles to recovery.  Patient shares belief that others will be difficult to deal with due to the fact the only people I know are users.  Patient was unwilling to explore idea of whom she choose to spend time with.    Clide Dales 04/01/2012, 2:48 PM

## 2012-04-01 NOTE — Progress Notes (Signed)
Psychoeducational Group Note  Date:  04/01/2012 Time:  1100  Group Topic/Focus:  Relapse Prevention Planning:   The focus of this group is to define relapse and discuss the need for planning to combat relapse.  Participation Level:  Active  Participation Quality:  Appropriate, Resistant and Sharing  Affect:  Anxious, Appropriate and Irritable  Cognitive:  Appropriate  Insight:  Good  Engagement in Group:  Good  Additional Comments:  Pt is loud and disruptive, she can be redirected at times. pt stated she was ready to go home as well.  Isla Pence M 04/01/2012, 12:11 PM

## 2012-04-02 DIAGNOSIS — F609 Personality disorder, unspecified: Secondary | ICD-10-CM

## 2012-04-02 DIAGNOSIS — F1110 Opioid abuse, uncomplicated: Secondary | ICD-10-CM

## 2012-04-02 LAB — GC/CHLAMYDIA PROBE AMP, URINE
Chlamydia, Swab/Urine, PCR: POSITIVE — AB
GC Probe Amp, Urine: NEGATIVE

## 2012-04-02 NOTE — Progress Notes (Signed)
Pt was pleasant and cooperative during the discharge assessment.  Pt acknowledged understanding of discharge instructions. Informed the writer that she's to be picked up by her social worker between [phone] and 0730

## 2012-04-03 NOTE — Progress Notes (Signed)
Patient Discharge Instructions:  After Visit Summary (AVS):   Faxed to:  04/03/2012 Psychiatric Admission Assessment Note:   Faxed to:  04/03/2012 Suicide Risk Assessment - Discharge Assessment:   Faxed to:  04/03/2012 Faxed/Sent to the Next Level Care provider:  04/03/2012  Faxed to [facility] @ [phone]  Wandra Scot, 04/03/2012, 7:26 PM

## 2012-04-18 ENCOUNTER — Telehealth: Payer: Self-pay | Admitting: Physician Assistant

## 2012-04-19 ENCOUNTER — Telehealth: Payer: Self-pay | Admitting: Physician Assistant

## 2012-04-22 NOTE — Discharge Summary (Signed)
Physician Discharge Summary Note   Patient:  Natasha Byrd is an 26 y.o., female MRN:  [id number] DOB:  October 24, 1985 Patient phone:  [phone] (home)  Patient address:   [address] [place] [address]  Date of Admission:  03/27/2012 Date of Discharge: 04/02/12  Reason for Admission: see H&P.  Principal Problem:  *Benzodiazepine abuse, continuous  Discharge Diagnoses: Stimulant Use Disorder (Cocaine); Cannabis Use Disorder; Opioid Use Disorder; GAD; PD NOS with Cluster B Traits   Level of Care:  [facility]  Hospital Course:  Pt admitted for crisis stabilization, detox and treatment. Pt attended all therapeutic groups, was active in her treatment planning process and agreed to her current medication regimen for further stability during recovery.  There were no acute issues during treatment and she was open to further residential substance abuse Tx.  All labs were reviewed with her in great detail and medical needs were addressed.  No acute safety issues were noted on the unit. Medications were reviewed with pt and medication education was provided. Mental health treatment, medication management and continued sobriety will mitigate against any increased risk of harm to self and/or others.  Discussed the importance of recovery with pt, as well as, tools to move forward in a healthy & safe manner using the 12 Step Process.    Consults: none.  Significant Diagnostic Studies:  See labs.  Discharge Vitals:   Blood pressure 112/64, pulse 99, temperature 98.3 F (36.8 C), temperature source Oral, resp. rate 20, height 5\' 5"  (1.651 m), weight 67.132 kg (148 lb), last menstrual period 03/11/2012, SpO2 96.00%.  Physical Findings: AIMS: Facial and Oral Movements Muscles of Facial Expression: None, normal Lips and Perioral Area: None, normal Jaw: None, normal Tongue: None, normal,Extremity Movements Upper (arms, wrists, hands, fingers): None, normal Lower (legs, knees, ankles, toes): None, normal,  Trunk Movements Neck, shoulders, hips: None, normal, Overall Severity Severity of abnormal movements (highest score from questions above): None, normal Incapacitation due to abnormal movements: None, normal Patient's awareness of abnormal movements (rate only patient's report): No Awareness, Dental Status Current problems with teeth and/or dentures?: No Does patient usually wear dentures?: No  CIWA:  CIWA-Ar Total: 0  COWS:  COWS Total Score: 0   Mental Status Exam: See Mental Status Examination and Suicide Risk Assessment completed by Attending Physician prior to discharge.  Discharge destination:  [facility]  Is patient on multiple antipsychotic therapies at discharge:  No   Has Patient had three or more failed trials of antipsychotic monotherapy by history:  No  Recommended Plan for Multiple Antipsychotic Therapies: NA   Medication List  As of 04/22/2012  9:10 AM   TAKE these medications      Indication    citalopram 20 MG tablet   Commonly known as: CELEXA   Take 1 tablet (20 mg total) by mouth daily. For anxiety/depression.       traZODone 100 MG tablet   Commonly known as: DESYREL   Take 1 tablet (100 mg total) by mouth at bedtime and may repeat dose one time if needed. For sleep.            Follow-up Information    Follow up with [facility] on 04/02/2012. (Be there at 8AM sharp)    Contact information:   [address] [facility]/Follow-up recommendations: Pt seen and evaluated in treatment team. Chart reviewed. Pt stable for and requesting discharge in am to [facility].  Pt contracting for safety and does not currently meet [place] involuntary commitment criteria for continued hospitalization against her will. Mental health treatment, medication management and continued sobriety will mitigate against the potential increased risk of harm to self and/or others. Discussed the importance of recovery further with pt, as well as, tools to move  forward in a healthy & safe manner. Pt agreeable with the plan. Discussed with the team. Recommend follow up with NA. Diet: Regular. Activity: As tolerated.   Signed: [name] 04/22/2012, 9:10 AM

## 2012-05-08 NOTE — ED Provider Notes (Signed)
History     CSN: [id number]  Arrival date & time 03/27/12  0009   First MD Initiated Contact with Patient 03/27/12 0140      Chief Complaint  Patient presents with  . detox     (Consider location/radiation/quality/duration/timing/severity/associated sxs/prior treatment) Patient is a 26 y.o. female presenting with drug problem.  Drug Problem This is a chronic (pt states wants detox from cocaine and etoh) problem. The current episode started more than 1 week ago. The problem occurs constantly. The problem has not changed since onset.Nothing aggravates the symptoms. Nothing relieves the symptoms. She has tried nothing for the symptoms.    Past Medical History  Diagnosis Date  . Depression     History reviewed. No pertinent past surgical history.  Family History  Problem Relation Age of Onset  . Abnormal EKG Mother     History  Substance Use Topics  . Smoking status: Current Some Day Smoker  . Smokeless tobacco: Not on file  . Alcohol Use: No    OB History    Grav Para Term Preterm Abortions TAB SAB Ect Mult Living                  Review of Systems  Psychiatric/Behavioral: Positive for dysphoric mood.  All other systems reviewed and are negative.    Allergies  Review of patient's allergies indicates no known allergies.  Home Medications   Current Outpatient Rx  Name Route Sig Dispense Refill  . CITALOPRAM HYDROBROMIDE 20 MG PO TABS Oral Take 1 tablet (20 mg total) by mouth daily. For anxiety/depression. 30 tablet 0  . TRAZODONE HCL 100 MG PO TABS Oral Take 1 tablet (100 mg total) by mouth at bedtime and may repeat dose one time if needed. For sleep. 30 tablet 0    BP 104/58  Pulse 61  Temp 97.9 F (36.6 C) (Oral)  Resp 18  SpO2 100%  LMP 03/11/2012  Physical Exam  Constitutional: She is oriented to person, place, and time. She appears well-developed and well-nourished.  HENT:  Head: Normocephalic and atraumatic.  Eyes: Conjunctivae normal and EOM  are normal. Pupils are equal, round, and reactive to light.  Neck: Normal range of motion.  Cardiovascular: Normal rate, regular rhythm and normal heart sounds.   Pulmonary/Chest: Effort normal and breath sounds normal.  Abdominal: Soft. Bowel sounds are normal.  Musculoskeletal: Normal range of motion.  Neurological: She is alert and oriented to person, place, and time.  Skin: Skin is warm and dry.  Psychiatric: She has a normal mood and affect. Her behavior is normal.    ED Course  Procedures (including critical care time)  Labs Reviewed  URINALYSIS, ROUTINE W REFLEX MICROSCOPIC - Abnormal; Notable for the following:    APPearance CLOUDY (*)     Leukocytes, UA SMALL (*)     All other components within normal limits  CBC WITH DIFFERENTIAL - Abnormal; Notable for the following:    RBC 3.73 (*)     HCT 34.9 (*)     Neutrophils Relative 34 (*)     Neutro Abs 1.6 (*)     Lymphocytes Relative 51 (*)     All other components within normal limits  COMPREHENSIVE METABOLIC PANEL - Abnormal; Notable for the following:    Glucose, Bld 107 (*)     Total Bilirubin 0.2 (*)     GFR calc non Af Amer 74 (*)     GFR calc Af Amer 85 (*)  All other components within normal limits  URINE RAPID DRUG SCREEN (HOSP PERFORMED) - Abnormal; Notable for the following:    Cocaine POSITIVE (*)     Tetrahydrocannabinol POSITIVE (*)     All other components within normal limits  URINE MICROSCOPIC-ADD ON - Abnormal; Notable for the following:    Squamous Epithelial / LPF MANY (*)     Bacteria, UA FEW (*)     All other components within normal limits  PREGNANCY, URINE  ETHANOL  LAB REPORT - SCANNED   No results found.   1. Polysubstance abuse       MDM  + substance abuse.  Medically clear.  Await behavioral health disposition.          Rosanne Ashing, MD 05/08/12 2125

## 2012-05-10 ENCOUNTER — Encounter (HOSPITAL_BASED_OUTPATIENT_CLINIC_OR_DEPARTMENT_OTHER): Payer: Self-pay | Admitting: *Deleted

## 2012-05-10 ENCOUNTER — Emergency Department (HOSPITAL_BASED_OUTPATIENT_CLINIC_OR_DEPARTMENT_OTHER)
Admission: EM | Admit: 2012-05-10 | Discharge: 2012-05-10 | Disposition: A | Discharge status: Home / Self Care | Payer: Self-pay | Attending: Emergency Medicine | Admitting: Emergency Medicine

## 2012-05-10 DIAGNOSIS — F3289 Other specified depressive episodes: Secondary | ICD-10-CM | POA: Insufficient documentation

## 2012-05-10 DIAGNOSIS — F329 Major depressive disorder, single episode, unspecified: Secondary | ICD-10-CM | POA: Insufficient documentation

## 2012-05-10 DIAGNOSIS — H6120 Impacted cerumen, unspecified ear: Secondary | ICD-10-CM | POA: Insufficient documentation

## 2012-05-10 DIAGNOSIS — F17200 Nicotine dependence, unspecified, uncomplicated: Secondary | ICD-10-CM | POA: Insufficient documentation

## 2012-05-10 MED ORDER — DOCUSATE SODIUM 100 MG PO CAPS
100.0000 mg | ORAL_CAPSULE | Freq: Once | ORAL | Status: AC
  Administered 2012-05-10: 100 mg via ORAL
  Filled 2012-05-10: qty 1

## 2012-05-10 NOTE — ED Provider Notes (Signed)
History     CSN: [id number]  Arrival date & time 05/10/12  1820   First MD Initiated Contact with Patient 05/10/12 1909      Chief Complaint  Patient presents with  . Otalgia    (Consider location/radiation/quality/duration/timing/severity/associated sxs/prior treatment) HPI Pt reports pain and progressively worsening hearing in her L ear over the last few days. She denies any drainage. No fever. Has had some sinus congestion.   Past Medical History  Diagnosis Date  . Depression     History reviewed. No pertinent past surgical history.  Family History  Problem Relation Age of Onset  . Abnormal EKG Mother     History  Substance Use Topics  . Smoking status: Current Some Day Smoker  . Smokeless tobacco: Not on file  . Alcohol Use: No    OB History    Grav Para Term Preterm Abortions TAB SAB Ect Mult Living                  Review of Systems All other systems reviewed and are negative except as noted in HPI.   Allergies  Review of patient's allergies indicates no known allergies.  Home Medications   Current Outpatient Rx  Name Route Sig Dispense Refill  . CITALOPRAM HYDROBROMIDE 20 MG PO TABS Oral Take 1 tablet (20 mg total) by mouth daily. For anxiety/depression. 30 tablet 0  . TRAZODONE HCL 100 MG PO TABS Oral Take 1 tablet (100 mg total) by mouth at bedtime and may repeat dose one time if needed. For sleep. 30 tablet 0    BP 125/71  Pulse 86  Temp 98.2 F (36.8 C) (Oral)  Resp 20  SpO2 100%  Physical Exam  Constitutional: She is oriented to person, place, and time. She appears well-developed and well-nourished.  HENT:  Head: Normocephalic and atraumatic.       R canal and TM normal, L canal with cerumen impaction  Neck: Neck supple.  Pulmonary/Chest: Effort normal.  Neurological: She is alert and oriented to person, place, and time. No cranial nerve deficit.  Psychiatric: She has a normal mood and affect. Her behavior is normal.    ED Course    Procedures (including critical care time)  Labs Reviewed - No data to display No results found.   No diagnosis found.    MDM  Attempted cerumen disimpaction with curette, but unable to tolerate and very deep. Ordered colace drops and irrigation.    7:59 PM No results with colace and irrigation. Offered to redose the colace and try again, but the patient refuses. Requests referral to ENT for recheck. Advised to return to the ED for any other concerns.      Coda Mathey B. Bernette Mayers, MD 05/10/12 2000

## 2012-05-10 NOTE — ED Notes (Signed)
Pt refused second

## 2012-05-10 NOTE — ED Notes (Signed)
States both ears feel "stopped up". Pain in left ear today.

## 2012-05-10 NOTE — ED Notes (Signed)
Pain in her left ear x 3 days. Pt is a resident at [facility].

## 2012-05-12 ENCOUNTER — Emergency Department (HOSPITAL_BASED_OUTPATIENT_CLINIC_OR_DEPARTMENT_OTHER)
Admission: EM | Admit: 2012-05-12 | Discharge: 2012-05-12 | Disposition: A | Discharge status: Home / Self Care | Payer: Self-pay | Attending: Emergency Medicine | Admitting: Emergency Medicine

## 2012-05-12 ENCOUNTER — Encounter (HOSPITAL_BASED_OUTPATIENT_CLINIC_OR_DEPARTMENT_OTHER): Payer: Self-pay | Admitting: Emergency Medicine

## 2012-05-12 DIAGNOSIS — H6120 Impacted cerumen, unspecified ear: Secondary | ICD-10-CM | POA: Insufficient documentation

## 2012-05-12 DIAGNOSIS — F329 Major depressive disorder, single episode, unspecified: Secondary | ICD-10-CM | POA: Insufficient documentation

## 2012-05-12 DIAGNOSIS — F3289 Other specified depressive episodes: Secondary | ICD-10-CM | POA: Insufficient documentation

## 2012-05-12 NOTE — ED Provider Notes (Signed)
History     CSN: [id number]  Arrival date & time 05/12/12  1047   First MD Initiated Contact with Patient 05/12/12 1112      Chief Complaint  Patient presents with  . Otalgia    (Consider location/radiation/quality/duration/timing/severity/associated sxs/prior treatment) HPI Comments: Left ear pain, has been ongoing for approximately week, has been referred to ear nose and throat doctor who she has not been able to see at this time. She has had in cerumen impaction which has been noted on prior exams, irrigation was unsuccessful at last visit. The patient states that the pain is similar, persistent, mild to moderate, no associated fevers.  Patient is a 26 y.o. female presenting with ear pain. The history is provided by the patient.  Otalgia    Past Medical History  Diagnosis Date  . Depression     History reviewed. No pertinent past surgical history.  Family History  Problem Relation Age of Onset  . Abnormal EKG Mother     History  Substance Use Topics  . Smoking status: Current Some Day Smoker  . Smokeless tobacco: Not on file  . Alcohol Use: No    OB History    Grav Para Term Preterm Abortions TAB SAB Ect Mult Living                  Review of Systems  Constitutional: Negative for fever.  HENT: Positive for ear pain.     Allergies  Review of patient's allergies indicates no known allergies.  Home Medications   Current Outpatient Rx  Name Route Sig Dispense Refill  . CITALOPRAM HYDROBROMIDE 20 MG PO TABS Oral Take 1 tablet (20 mg total) by mouth daily. For anxiety/depression. 30 tablet 0  . TRAZODONE HCL 100 MG PO TABS Oral Take 1 tablet (100 mg total) by mouth at bedtime and may repeat dose one time if needed. For sleep. 30 tablet 0    BP 100/60  Pulse 83  Temp 98.2 F (36.8 C) (Oral)  Resp 18  SpO2 100%  Physical Exam  Nursing note and vitals reviewed. Constitutional: She appears well-developed and well-nourished. No distress.  HENT:  Head:  Normocephalic and atraumatic.       Cerumen impaction on the left, no signs of erythema, no pain with manipulation of the auricle or tragus  Eyes: Conjunctivae normal are normal. No scleral icterus.  Neck: Normal range of motion. Neck supple.  Lymphadenopathy:    She has no cervical adenopathy.  Neurological: She is alert. Coordination normal.  Skin: Skin is warm and dry. No rash noted. No erythema.    ED Course  EAR CERUMEN REMOVAL Date/Time: 05/12/2012 11:56 AM Performed by: Eber Hong D Authorized by: Eber Hong D Consent: Verbal consent obtained. Consent given by: patient Patient identity confirmed: verbally with patient Time out: Immediately prior to procedure a "time out" was called to verify the correct patient, procedure, equipment, support staff and site/side marked as required. Local anesthetic: none Location details: left ear Procedure type: curette Patient sedated: no Patient tolerance: Patient tolerated the procedure well with no immediate complications.   (including critical care time)  Labs Reviewed - No data to display No results found.   1. Cerumen impaction       MDM  Well-appearing, cerumen impaction, we'll attempt manual removal.   Cerumen removed manually, see note below, patient stable for discharge, has ENT phone number and will call in the morning.     Vida Roller, MD 05/12/12 [phone]

## 2012-05-12 NOTE — ED Notes (Signed)
Patient at door stating her ride is here and she needs to go speak with them. Patient advised to wait on discharge instructions. Patient going to lobby to talk to her ride.

## 2012-05-12 NOTE — ED Notes (Signed)
States has an earache........also states the last 2visits she was told to see a "specialist".

## 2012-05-29 NOTE — Telephone Encounter (Signed)
Please see phone notes. Rona Ravens. Kasi Lasky [facility] 05/29/2012 4:02 PM

## 2012-07-28 ENCOUNTER — Inpatient Hospital Stay (HOSPITAL_COMMUNITY)
Admission: AD | Admit: 2012-07-28 | Discharge: 2012-07-28 | Disposition: A | Discharge status: Home / Self Care | Payer: Self-pay | Source: Ambulatory Visit | Attending: Obstetrics & Gynecology | Admitting: Obstetrics & Gynecology

## 2012-07-28 ENCOUNTER — Encounter (HOSPITAL_COMMUNITY): Payer: Self-pay | Admitting: *Deleted

## 2012-07-28 DIAGNOSIS — R109 Unspecified abdominal pain: Secondary | ICD-10-CM | POA: Insufficient documentation

## 2012-07-28 DIAGNOSIS — Z3490 Encounter for supervision of normal pregnancy, unspecified, unspecified trimester: Secondary | ICD-10-CM

## 2012-07-28 DIAGNOSIS — Z3201 Encounter for pregnancy test, result positive: Secondary | ICD-10-CM | POA: Insufficient documentation

## 2012-07-28 MED ORDER — CONCEPT OB 130-92.4-1 MG PO CAPS: 1.0000 | ORAL_CAPSULE | ORAL | Status: DC

## 2012-07-28 NOTE — MAU Note (Signed)
"  I'm having cramping for 3 days.  I have been having discharge from my nipples for about 4-5 weeks.  The nipple d/c only comes out when I squeeze it."

## 2012-07-28 NOTE — MAU Note (Signed)
Pt thinks she might be pregnant. "feeling movement"  Has had 2 positive pregnancy test at home. Reports having yellow/green discharge from her breast.

## 2012-08-14 NOTE — L&D Delivery Note (Signed)
Delivery Note At 12:31 AM a viable and healthy female was delivered via Vaginal, Spontaneous Delivery (Presentation: Left Occiput Anterior).  APGAR: 9, 9; weight pending.   Placenta status: Intact, Spontaneous.  Cord: 3 vessels with the following complications: None.   Anesthesia: Epidural  Episiotomy: None Lacerations: Labial;1st degree Suture Repair: none Est. Blood Loss (mL): 200  Mom to postpartum.  Baby to nursery-stable.  Tawni Carnes 03/27/2013, 12:43 AM

## 2012-08-14 NOTE — L&D Delivery Note (Signed)
I have seen and examined this patient and I agree with the above. Lac hemostatic and well-approx; not repaired. Cam Hai 4:53 AM 03/27/2013

## 2012-09-16 ENCOUNTER — Ambulatory Visit: Payer: Self-pay | Admitting: Family Medicine

## 2012-09-25 ENCOUNTER — Encounter: Payer: Self-pay | Admitting: Family Medicine

## 2012-09-25 ENCOUNTER — Ambulatory Visit (INDEPENDENT_AMBULATORY_CARE_PROVIDER_SITE_OTHER): Payer: Self-pay | Admitting: Family Medicine

## 2012-09-25 ENCOUNTER — Other Ambulatory Visit: Payer: Self-pay | Admitting: Family Medicine

## 2012-09-25 VITALS — BP 122/70 | HR 83 | Temp 98.7°F | Ht 65.0 in | Wt 151.0 lb

## 2012-09-25 DIAGNOSIS — N912 Amenorrhea, unspecified: Secondary | ICD-10-CM

## 2012-09-25 DIAGNOSIS — R109 Unspecified abdominal pain: Secondary | ICD-10-CM

## 2012-09-25 LAB — POCT URINALYSIS DIPSTICK
Glucose, UA: NEGATIVE
Leukocytes, UA: NEGATIVE
Protein, UA: 100
Spec Grav, UA: >=1.03
Urobilinogen, UA: 2

## 2012-09-25 LAB — POCT UA - MICROSCOPIC ONLY

## 2012-09-25 NOTE — Assessment & Plan Note (Addendum)
Plan was for UA, urine culture, cbc, cmet, gonorrhea, chlamydia testing, pelvic exam. UA without clear signs of UTI. Possible BV based off of clue cells but needs pelvic exam and further symptoms addressed.  Unfortunately, patient became upset by wait and left the office and we were unable to obtain. Fortunately, [facility]   Attempted to reach patient by phone x2 but not current # and mother at current phone # cannot forward me to her cell phone. When ultrasound results available, will attempt to reach patient.   Small amounts of blood in emesis concerning and will need follow up appointment.   Also had planned for serial HCG and unable to obtain.

## 2012-09-25 NOTE — Progress Notes (Signed)
Subjective:   1. Amenorrhea-LMP 06-28-12. [facility]. Unclear why patient has not sought care since that time. She states her family is concerned this is a tubal pregnancy because she is not showing yet. ROS shows morning sickness with nausea for 2 months. States has had intermittent daytime vomiting with eating or drinking in recent days. Also occasionally sees blood tinged vomit. Furthermore complains of lower abdominal pain for 2 months. States pain is severe and unable to sleep at night due to pain. Slightly more frequent. Patient cannot explain why she has not sought care for the above issues earlier. Denies fever. She states she has had positive and negative pregnancy tests with most recent negative in January.   ROS--See HPI  Past Medical History Patient Active Problem List  Diagnosis  . TOBACCO ABUSE  . ATTENTION DEFICIT, W/HYPERACTIVITY  . 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 HEADACHE, UNSPECIFIED  . Benzodiazepine abuse, continuous   Reviewed problem list.  Medications- reviewed and updated Chief complaint-noted  Objective: BP 122/70  Pulse 83  Temp(Src) 98.7 F (37.1 C) (Oral)  Ht 5\' 5"  (1.651 m)  Wt 151 lb (68.493 kg)  BMI 25.13 kg/m2  LMP 06/28/2012 Gen: NAD, resting comfortably on table, MMM CV: RRR no murmurs rubs or gallops Lungs: CTAB no crackles, wheeze, rhonchi Abdomen: patient slightly tenses abdomen but no rebound or guarding. Difficult to assess fundus. Fetal HR noted at 155.  Soft/nondistended. Tenderness to palpation not noted with stethoscope and normal bowel sounds. When later asking about pain, patient flinches with very soft palpation in all quadrants.  Skin: warm, dry Neuro: grossly normal, moves all extremities  Assessment/Plan:

## 2012-09-25 NOTE — Assessment & Plan Note (Signed)
upreg positive in office. Given abdominal pain, plan is for transvaginal ultrasound.

## 2012-09-28 LAB — URINE CULTURE: Colony Count: >=100000

## 2012-09-30 ENCOUNTER — Telehealth: Payer: Self-pay | Admitting: Family Medicine

## 2012-09-30 DIAGNOSIS — N390 Urinary tract infection, site not specified: Secondary | ICD-10-CM

## 2012-09-30 MED ORDER — NITROFURANTOIN MONOHYD MACRO 100 MG PO CAPS: 100.0000 mg | ORAL_CAPSULE | Freq: Two times a day (BID) | ORAL | Status: DC

## 2012-09-30 NOTE — Telephone Encounter (Signed)
Attempted to reach patient but does not live with her mother who is primary contact #. Asked once again for patient to give [place] a call.   I have sent in 7 days of Nitrofurantoin for enterococcus UTI. Patient will need test of cure after therapy.

## 2012-10-01 ENCOUNTER — Ambulatory Visit (HOSPITAL_COMMUNITY): Payer: Self-pay | Attending: Family Medicine

## 2012-10-02 ENCOUNTER — Encounter: Payer: Self-pay | Admitting: Physician Assistant

## 2012-10-08 ENCOUNTER — Ambulatory Visit (HOSPITAL_COMMUNITY): Payer: Self-pay

## 2012-10-10 ENCOUNTER — Other Ambulatory Visit: Payer: Self-pay | Admitting: Family Medicine

## 2012-10-10 ENCOUNTER — Ambulatory Visit (HOSPITAL_COMMUNITY)
Admission: RE | Admit: 2012-10-10 | Discharge: 2012-10-10 | Disposition: A | Discharge status: Home / Self Care | Payer: Medicaid Other | Source: Ambulatory Visit | Attending: Family Medicine | Admitting: Family Medicine

## 2012-10-10 DIAGNOSIS — N912 Amenorrhea, unspecified: Secondary | ICD-10-CM

## 2012-10-10 DIAGNOSIS — R109 Unspecified abdominal pain: Secondary | ICD-10-CM

## 2012-10-10 DIAGNOSIS — Z3689 Encounter for other specified antenatal screening: Secondary | ICD-10-CM | POA: Insufficient documentation

## 2012-10-11 ENCOUNTER — Telehealth: Payer: Self-pay | Admitting: Family Medicine

## 2012-10-11 NOTE — Telephone Encounter (Signed)
Attempted to reach patient again. Does not live at # provided. Mother answers. THis is the 3rd time we have talked about patient needing to reach [place] for follow up and mother continues to deliver message. She does not have a # we can reach daughter at due to it being a different # her daughter calls from each time.

## 2012-10-11 NOTE — Telephone Encounter (Signed)
Pt mother was in here today and still had not heard back from Pt. Natasha Byrd, [name]

## 2012-10-17 ENCOUNTER — Telehealth: Payer: Self-pay | Admitting: Family Medicine

## 2012-10-17 NOTE — Telephone Encounter (Signed)
Patient called to schedule first ob appt. Recently release from jail. Has not obtain Pregnancy Medicaid yet. Will call us when she receives card.

## 2012-11-12 ENCOUNTER — Emergency Department (HOSPITAL_COMMUNITY)
Admission: EM | Admit: 2012-11-12 | Discharge: 2012-11-14 | Disposition: A | Discharge status: Home / Self Care | Payer: Medicaid Other | Attending: Emergency Medicine | Admitting: Emergency Medicine

## 2012-11-12 DIAGNOSIS — F329 Major depressive disorder, single episode, unspecified: Secondary | ICD-10-CM

## 2012-11-12 DIAGNOSIS — F3289 Other specified depressive episodes: Secondary | ICD-10-CM | POA: Insufficient documentation

## 2012-11-12 DIAGNOSIS — Z87891 Personal history of nicotine dependence: Secondary | ICD-10-CM | POA: Insufficient documentation

## 2012-11-13 ENCOUNTER — Encounter (HOSPITAL_COMMUNITY): Payer: Self-pay | Admitting: Family Medicine

## 2012-11-13 LAB — ACETAMINOPHEN LEVEL: Acetaminophen (Tylenol), Serum: <15 ug/mL (ref 10–30)

## 2012-11-13 LAB — COMPREHENSIVE METABOLIC PANEL
ALT: 5 U/L (ref 0–35)
AST: 12 U/L (ref 0–37)
Albumin: 2.7 g/dL — ABNORMAL LOW (ref 3.5–5.2)
Alkaline Phosphatase: 45 U/L (ref 39–117)
CO2: 22 mEq/L (ref 19–32)
Chloride: 104 mEq/L (ref 96–112)
Creatinine, Ser: 0.56 mg/dL (ref 0.50–1.10)
GFR calc non Af Amer: >90 mL/min (ref >90)
Potassium: 3.2 mEq/L — ABNORMAL LOW (ref 3.5–5.1)
Total Bilirubin: 0.1 mg/dL — ABNORMAL LOW (ref 0.3–1.2)

## 2012-11-13 LAB — RAPID URINE DRUG SCREEN, HOSP PERFORMED
Amphetamines: NOT DETECTED
Barbiturates: NOT DETECTED
Benzodiazepines: NOT DETECTED
Tetrahydrocannabinol: POSITIVE — AB

## 2012-11-13 LAB — CBC
MCV: 94.2 fL (ref 78.0–100.0)
Platelets: 209 10*3/uL (ref 150–400)
RBC: 2.95 MIL/uL — ABNORMAL LOW (ref 3.87–5.11)
RDW: 12.7 % (ref 11.5–15.5)
WBC: 6.1 10*3/uL (ref 4.0–10.5)

## 2012-11-13 LAB — SALICYLATE LEVEL: Salicylate Lvl: <2 mg/dL — ABNORMAL LOW (ref 2.8–20.0)

## 2012-11-13 MED ORDER — IBUPROFEN 600 MG PO TABS: 600.0000 mg | ORAL_TABLET | Freq: Three times a day (TID) | ORAL | Status: DC | PRN

## 2012-11-13 MED ORDER — ZOLPIDEM TARTRATE 5 MG PO TABS: 5.0000 mg | ORAL_TABLET | Freq: Every evening | ORAL | Status: DC | PRN

## 2012-11-13 MED ORDER — POTASSIUM CHLORIDE CRYS ER 20 MEQ PO TBCR
40.0000 meq | EXTENDED_RELEASE_TABLET | Freq: Once | ORAL | Status: AC
  Administered 2012-11-13: 40 meq via ORAL
  Filled 2012-11-13: qty 2

## 2012-11-13 MED ORDER — ONDANSETRON HCL 4 MG PO TABS
4.0000 mg | ORAL_TABLET | Freq: Three times a day (TID) | ORAL | Status: DC | PRN
  Filled 2012-11-13: qty 1

## 2012-11-13 MED ORDER — LORAZEPAM 1 MG PO TABS: 1.0000 mg | ORAL_TABLET | Freq: Three times a day (TID) | ORAL | Status: DC | PRN

## 2012-11-13 MED ORDER — ACETAMINOPHEN 325 MG PO TABS: 650.0000 mg | ORAL_TABLET | ORAL | Status: DC | PRN

## 2012-11-13 MED ORDER — NICOTINE 21 MG/24HR TD PT24: 21.0000 mg | MEDICATED_PATCH | Freq: Every day | TRANSDERMAL | Status: DC

## 2012-11-13 NOTE — ED Notes (Signed)
Patient quiet but cooperative. Patient appears sad, tearful episodes at times. Patient endorses passive SI, states "I think about hurting myself all the time." Patient endorsing HI, verbalizes "I want to hurt somebody but nobody that is here." Patient positive for AVH, verbalizes "I hear voices and see shadows sometimes." Patient contracts verbally for safety with RN. Patient safe on unit with Q15 minute checks for safety. Will continue to monitor.

## 2012-11-13 NOTE — ED Provider Notes (Signed)
Medical screening examination/treatment/procedure(s) were performed by non-physician practitioner and as supervising physician I was immediately available for consultation/collaboration.   Shelda Jakes, MD 11/13/12 [phone]

## 2012-11-13 NOTE — ED Notes (Signed)
Fetal heart tones auscultated via doppler, rate 152 bpm.

## 2012-11-13 NOTE — ED Notes (Addendum)
Patient states "I'm stressed and depressed and I want to kill myself. I'm scared I'm going to hurt somebody." Patient states that she has been feeling like this "for some months." Patient states that she has been hospitalized in the past for this. Patient states her plan would be to shoot herself, cut her wrists or "take a bunch of medicine." Patient states that she is 4 months pregnant.

## 2012-11-13 NOTE — BH Assessment (Signed)
[facility] Assessment Progress Note      Consulted with Everlene Balls re this admission after first speaking with Aggie. Shawn recommended she first be evaluated by Dr Ozella Rocks due to her being four months pregnant and homeless.

## 2012-11-13 NOTE — ED Provider Notes (Signed)
History     CSN: [id number]  Arrival date & time 11/12/12  2307   First MD Initiated Contact with Patient 11/13/12 0150      Chief Complaint  Patient presents with  . Medical Clearance    (Consider location/radiation/quality/duration/timing/severity/associated sxs/prior treatment) HPI Comments: Patient states she needs to take a break from her self treatment about shooting herself.  She has a history of cutting herself.  Nothing particular has happened except or "mamma told her to come in"  The history is provided by the patient.    Past Medical History  Diagnosis Date  . Depression     History reviewed. No pertinent past surgical history.  Family History  Problem Relation Age of Onset  . Abnormal EKG Mother     History  Substance Use Topics  . Smoking status: Former Games developer  . Smokeless tobacco: Not on file  . Alcohol Use: No     Comment: Weekends    OB History   Grav Para Term Preterm Abortions TAB SAB Ect Mult Living   3 2 1       1       Review of Systems  Constitutional: Negative for fever.  Respiratory: Negative for cough.   Gastrointestinal: Negative for abdominal pain.  Neurological: Negative for headaches.  All other systems reviewed and are negative.    Allergies  Review of patient's allergies indicates no known allergies.  Home Medications  No current outpatient prescriptions on file.  BP 119/49  Pulse 73  Temp(Src) 98.5 F (36.9 C) (Oral)  Resp 20  SpO2 100%  LMP 06/28/2012  Physical Exam  Constitutional: She appears well-developed and well-nourished.  HENT:  Head: Normocephalic.  Eyes: Pupils are equal, round, and reactive to light.  Neck: Normal range of motion.  Cardiovascular: Normal rate.   Pulmonary/Chest: Effort normal.  Musculoskeletal: Normal range of motion.  Neurological: She is alert.  Skin: Skin is warm. No rash noted.  Psychiatric: Her speech is delayed. She is slowed. Cognition and memory are normal. She expresses  inappropriate judgment. She exhibits a depressed mood. She expresses suicidal ideation. She expresses suicidal plans. She is inattentive.    ED Course  Procedures (including critical care time)  Labs Reviewed  CBC - Abnormal; Notable for the following:    RBC 2.95 (*)    Hemoglobin 9.7 (*)    HCT 27.8 (*)    All other components within normal limits  COMPREHENSIVE METABOLIC PANEL - Abnormal; Notable for the following:    Potassium 3.2 (*)    Albumin 2.7 (*)    Total Bilirubin 0.1 (*)    All other components within normal limits  SALICYLATE LEVEL - Abnormal; Notable for the following:    Salicylate Lvl <2.0 (*)    All other components within normal limits  ACETAMINOPHEN LEVEL  ETHANOL  URINE RAPID DRUG SCREEN (HOSP PERFORMED)   No results found.   No diagnosis found.    MDM   Patient's potassium is slightly low, this been supplemented.  She is anemic, but not in need of transfusion have asked that she be moved to the psychiatric unit and tell psych consult performed        Arman Filter, NP 11/13/12 0251  Arman Filter, NP 11/13/12 4098
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
## 2012-11-13 NOTE — ED Notes (Signed)
Patient given cheese, crackers and fruit juice per patient request.

## 2012-11-13 NOTE — ED Notes (Signed)
One bag placed in locker 43

## 2012-11-13 NOTE — ED Provider Notes (Signed)
History     CSN: [id number]  Arrival date & time 11/12/12  2307   First MD Initiated Contact with Patient 11/13/12 0150      Chief Complaint  Patient presents with  . Medical Clearance    (Consider location/radiation/quality/duration/timing/severity/associated sxs/prior treatment) HPI  Past Medical History  Diagnosis Date  . Depression     History reviewed. No pertinent past surgical history.  Family History  Problem Relation Age of Onset  . Abnormal EKG Mother     History  Substance Use Topics  . Smoking status: Former Games developer  . Smokeless tobacco: Not on file  . Alcohol Use: No     Comment: Weekends    OB History   Grav Para Term Preterm Abortions TAB SAB Ect Mult Living   3 2 1       1       Review of Systems  Allergies  Review of patient's allergies indicates no known allergies.  Home Medications  No current outpatient prescriptions on file.  BP 108/71  Pulse 68  Temp(Src) 98.3 F (36.8 C) (Oral)  Resp 18  SpO2 100%  LMP 06/28/2012  Physical Exam  ED Course  Procedures (including critical care time)  Labs Reviewed  CBC - Abnormal; Notable for the following:    RBC 2.95 (*)    Hemoglobin 9.7 (*)    HCT 27.8 (*)    All other components within normal limits  COMPREHENSIVE METABOLIC PANEL - Abnormal; Notable for the following:    Potassium 3.2 (*)    Albumin 2.7 (*)    Total Bilirubin 0.1 (*)    All other components within normal limits  SALICYLATE LEVEL - Abnormal; Notable for the following:    Salicylate Lvl <2.0 (*)    All other components within normal limits  URINE RAPID DRUG SCREEN (HOSP PERFORMED) - Abnormal; Notable for the following:    Tetrahydrocannabinol POSITIVE (*)    All other components within normal limits  ACETAMINOPHEN LEVEL  ETHANOL   No results found.   1. Depression       MDM          Arman Filter, NP 11/16/12 1955

## 2012-11-13 NOTE — ED Provider Notes (Signed)
Patient presented with depression, homicidal ideation and suicidal ideation. She is being evaluated for inpatient treatment. Patient resting comfortably this morning. No problems reported overnight. Continue current treatment plan.   Gilda Crease, MD 11/13/12 [phone]

## 2012-11-13 NOTE — Progress Notes (Signed)
Per discussion with Tori, patient is being considered at [facility]. CSW spoke with Ala Dach in assessment who is going to run patient for admission.   Catha Gosselin, LCSWA  [phone] 11/13/2012 1218pm

## 2012-11-13 NOTE — Consult Note (Signed)
Reason for Consult: Depression, suicidal ideation Referring Physician: Dr. [name] is an 27 y.o. female.  HPI: Patient came with her aunt to the [facility] long emergency department with the complaints of feeling depression and suicidal thoughts. Patient also expressed her intention to shoot herself with a gun and her friend has a gun. 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 She is missing her mother, [place] and her brother 11 years old, who was incarcerated 3 years ago. Patient stated that she was raised by adopted parent's and then ran away from them and living by herself. Patient was previously taking Concerta for ADHD. Patient has been living by herself in a 2 bedroom apartment for the last few months and has plans to stay in the same place for next 3 months and then go to the court to get custody of her 19 years old son who is in custody of for other aunt.  MSE: Patient was awake, alert, and oriented. Has stated mood was depressed and a stressful and affect was dysphoric. Patient has suicidal thoughts and intentions and plans. She has no evidence of homicidal ideation, intents or plans. Has no evidence of psychotic symptoms.  Past Medical History  Diagnosis Date  . Depression     History reviewed. No pertinent past surgical history.  Family History  Problem Relation Age of Onset  . Abnormal EKG Mother     Social History:  reports that she has quit smoking. She does not have any smokeless tobacco history on file. She reports that she uses illicit drugs (Marijuana). She reports that she does not drink alcohol.  Allergies: No Known Allergies  Medications: I have reviewed the patient's current medications.  Results for orders placed during the hospital encounter of 11/12/12 (from the past 48 hour(s))   ACETAMINOPHEN LEVEL     Status: None   Collection Time    11/13/12 12:55 AM      Result Value Range   Acetaminophen (Tylenol), Serum <15.0  10 - 30 ug/mL   Comment:            THERAPEUTIC CONCENTRATIONS VARY     SIGNIFICANTLY. A RANGE OF 10-30     ug/mL MAY BE AN EFFECTIVE     CONCENTRATION FOR MANY PATIENTS.     HOWEVER, SOME ARE BEST TREATED     AT CONCENTRATIONS OUTSIDE THIS     RANGE.     ACETAMINOPHEN CONCENTRATIONS     >150 ug/mL AT 4 HOURS AFTER     INGESTION AND >50 ug/mL AT 12     HOURS AFTER INGESTION ARE     OFTEN ASSOCIATED WITH TOXIC     REACTIONS.  CBC     Status: Abnormal   Collection Time    11/13/12 12:55 AM      Result Value Range   WBC 6.1  4.0 - 10.5 K/uL   RBC 2.95 (*) 3.87 - 5.11 MIL/uL   Hemoglobin 9.7 (*) 12.0 - 15.0 g/dL   HCT 16.1 (*) 09.6 - 04.5 %   MCV 94.2  78.0 - 100.0 fL   MCH 32.9  26.0 - 34.0 pg   MCHC 34.9  30.0 - 36.0 g/dL   RDW 40.9  81.1 - 91.4 %   Platelets 209  150 - 400 K/uL  COMPREHENSIVE  METABOLIC PANEL     Status: Abnormal   Collection Time    11/13/12 12:55 AM      Result Value Range   Sodium 135  135 - 145 mEq/L   Potassium 3.2 (*) 3.5 - 5.1 mEq/L   Chloride 104  96 - 112 mEq/L   CO2 22  19 - 32 mEq/L   Glucose, Bld 85  70 - 99 mg/dL   BUN 8  6 - 23 mg/dL   Creatinine, Ser 9.14  0.50 - 1.10 mg/dL   Calcium 8.6  8.4 - 78.2 mg/dL   Total Protein 6.3  6.0 - 8.3 g/dL   Albumin 2.7 (*) 3.5 - 5.2 g/dL   AST 12  0 - 37 U/L   ALT 5  0 - 35 U/L   Alkaline Phosphatase 45  39 - 117 U/L   Total Bilirubin 0.1 (*) 0.3 - 1.2 mg/dL   GFR calc non Af Amer >90  >90 mL/min   GFR calc Af Amer >90  >90 mL/min   Comment:            The eGFR has been calculated     using the CKD EPI equation.     This calculation has not been     validated in all clinical     situations.     eGFR's persistently     <90 mL/min signify     possible Chronic Kidney Disease.  ETHANOL     Status: None   Collection Time    11/13/12 12:55 AM      Result  Value Range   Alcohol, Ethyl (B) <11  0 - 11 mg/dL   Comment:            LOWEST DETECTABLE LIMIT FOR     SERUM ALCOHOL IS 11 mg/dL     FOR MEDICAL PURPOSES ONLY  SALICYLATE LEVEL     Status: Abnormal   Collection Time    11/13/12 12:55 AM      Result Value Range   Salicylate Lvl <2.0 (*) 2.8 - 20.0 mg/dL  URINE RAPID DRUG SCREEN (HOSP PERFORMED)     Status: Abnormal   Collection Time    11/13/12  9:37 AM      Result Value Range   Opiates NONE DETECTED  NONE DETECTED   Cocaine NONE DETECTED  NONE DETECTED   Benzodiazepines NONE DETECTED  NONE DETECTED   Amphetamines NONE DETECTED  NONE DETECTED   Tetrahydrocannabinol POSITIVE (*) NONE DETECTED   Barbiturates NONE DETECTED  NONE DETECTED   Comment:            DRUG SCREEN FOR MEDICAL PURPOSES     ONLY.  IF CONFIRMATION IS NEEDED     FOR ANY PURPOSE, NOTIFY LAB     WITHIN 5 DAYS.                LOWEST DETECTABLE LIMITS     FOR URINE DRUG SCREEN     Drug Class       Cutoff (ng/mL)     Amphetamine      1000     Barbiturate      200     Benzodiazepine   200     Tricyclics       300     Opiates          300     Cocaine          300     THC  50    No results found.  Positive for anxiety, bad mood, depression, mood swings and Self-injurious behavior Blood pressure 97/57, pulse 70, temperature 98.3 F (36.8 C), temperature source Oral, resp. rate 16, last menstrual period 06/28/2012, SpO2 98.00%.   Assessment/Plan: Depressive disorder, not otherwise specified Second trimester pregnancy, at a 4 and half months gestation. Cannabis abuse as per urine drug screen Attention deficit hyperactive disorder by history  Recommendation: Patient benefit from a psychiatric hospitalization as she was not psychiatrically stable. no medication management. Recommended at this time.   Natasha Byrd,Natasha R. 11/13/2012, 1:26 PM

## 2012-11-13 NOTE — ED Notes (Signed)
Pt tearful when moved back to psych ED. Wanting to know how long she has to stay here. ANd asking if she gets to go home. It was explained to her to make sure she lets the MD know how she is feeling so they both can come up with a plan.

## 2012-11-14 DIAGNOSIS — F329 Major depressive disorder, single episode, unspecified: Secondary | ICD-10-CM

## 2012-11-14 DIAGNOSIS — F1210 Cannabis abuse, uncomplicated: Secondary | ICD-10-CM

## 2012-11-14 DIAGNOSIS — O99340 Other mental disorders complicating pregnancy, unspecified trimester: Secondary | ICD-10-CM

## 2012-11-14 MED ORDER — FOLIC ACID 1 MG PO TABS
1.0000 mg | ORAL_TABLET | Freq: Every day | ORAL | Status: DC
  Administered 2012-11-14: 1 mg via ORAL
  Filled 2012-11-14: qty 1

## 2012-11-14 NOTE — Progress Notes (Addendum)
CSW met with pt at bedside to assess patient current support system. Pt have given pt adopted mother's name Dan Dissinger [phone]. CSW confirmed with pt adopted mother that she is a strong support and usually is able to talk with pt regarding feelings. Pt confirmed that Ms. Harral is a strong support, and will be checking in with Ms. Maxham daily regarding her feelings. Pt also stated that she is interested in counseling. CSW provided patient with outpatient resources. Pt psychiatrically cleared by psychiatrist for discharge home. Pt mother to provide pt transportation home. Patient able to contract for safety.   Catha Gosselin, LCSWA  [phone] 11/14/2012 1223pm

## 2012-11-14 NOTE — ED Notes (Signed)
Spoke with charge nurse concerning pt's complaints of possible contractions.  She states that pt would potentially need fetal monitoring and that she would speak with MD.

## 2012-11-14 NOTE — ED Notes (Signed)
Pt c/o contractions, I checked fetal heart tones 140-160 to lower pubic area, pt c/o "contractions" as I was checked heart tones and as I was in process the baby was moving I had to move around to stay with heart tone. No distress noted. Pt a/o denies vaginal discharge or vomiting

## 2012-11-14 NOTE — ED Provider Notes (Signed)
Filed Vitals:   11/14/12 0601  BP: 99/62  Pulse: 71  Temp: 98.2 F (36.8 C)  Resp: 18    Assuming care of patient this morning. Patient in the ED for SI. She is pregnant. Workup thus far is negative. Patient had no complains, no concerns from the nursing side. Will continue to monitor.    Derwood Kaplan, MD 11/14/12 [phone]

## 2012-11-14 NOTE — ED Notes (Signed)
Pt c/o "contraction" pain. Discussed with Dr. Rhunette Croft. He requested we offer here acetaminophen for pain and he would discuss doing a fetal heartrate with ED charge RN

## 2012-11-14 NOTE — ED Notes (Signed)
Pt denies SI/HI/AVH. Pt given discharge instructions including f/u appointments. Pt states understanding. Pt states receipt of all belongings.   

## 2012-11-14 NOTE — Consult Note (Signed)
Reason for Consult: depression, cannabis abuse and psychosocial stress Referring Physician: Dr. Suzanne Boron is an 27 y.o. female.  HPI: Patient was seen for the re-evaluation and appropriate recommendations. Patient stated she has been feeling much better, not emotional thinking, clear and the stated she was upset with her roommate who was not able to pick up. She was also stressed about not able to see her son and being pregnant as less support from the baby's father. Patient stated she cannot take any medication because of pregnancy, and want to care the baby throughout the pregnancy, and want to take appropriate. OB/GYN treatment and received support from the extended family members including her aunt adopted mother and cells has been in contact with her biological mother. Patient has family history of flow, schizophrenia and bipolar disorder. The patient does not have symptoms of bipolar or Psychosis patient contract for safety and willing to participate in outpatient psychiatric services for therapies to manage her attitude and relationship with her family members. Patient adopted mother was so contacted, who seems to be pretty good support for the patient and willing to provide the additional support needed per her.  MSE: Patient was calm and quite uncooperative. Patient has fine mood with appropriate, bright and full. Fracture is normal rate, rhythm and volume of speech and thought process is linear and goal-directed. She does no suicidal ideation, intents or plans. Has no onset ideation, intents or plans. Has no evidence of psychotic symptoms.  Past Medical History  Diagnosis Date  . Depression     History reviewed. No pertinent past surgical history.  Family History  Problem Relation Age of Onset  . Abnormal EKG Mother     Social History:  reports that she has quit smoking. She does not have any smokeless tobacco history on file. She reports that she uses illicit drugs  (Marijuana). She reports that she does not drink alcohol.  Allergies: No Known Allergies  Medications: I have reviewed the patient's current medications.  Results for orders placed during the hospital encounter of 11/12/12 (from the past 48 hour(s))  ACETAMINOPHEN LEVEL     Status: None   Collection Time    11/13/12 12:55 AM      Result Value Range   Acetaminophen (Tylenol), Serum <15.0  10 - 30 ug/mL   Comment:            THERAPEUTIC CONCENTRATIONS VARY     SIGNIFICANTLY. A RANGE OF 10-30     ug/mL MAY BE AN EFFECTIVE     CONCENTRATION FOR MANY PATIENTS.     HOWEVER, SOME ARE BEST TREATED     AT CONCENTRATIONS OUTSIDE THIS     RANGE.     ACETAMINOPHEN CONCENTRATIONS     >150 ug/mL AT 4 HOURS AFTER     INGESTION AND >50 ug/mL AT 12     HOURS AFTER INGESTION ARE     OFTEN ASSOCIATED WITH TOXIC     REACTIONS.  CBC     Status: Abnormal   Collection Time    11/13/12 12:55 AM      Result Value Range   WBC 6.1  4.0 - 10.5 K/uL   RBC 2.95 (*) 3.87 - 5.11 MIL/uL   Hemoglobin 9.7 (*) 12.0 - 15.0 g/dL   HCT 16.1 (*) 09.6 - 04.5 %   MCV 94.2  78.0 - 100.0 fL   MCH 32.9  26.0 - 34.0 pg   MCHC 34.9  30.0 - 36.0 g/dL  RDW 12.7  11.5 - 15.5 %   Platelets 209  150 - 400 K/uL  COMPREHENSIVE METABOLIC PANEL     Status: Abnormal   Collection Time    11/13/12 12:55 AM      Result Value Range   Sodium 135  135 - 145 mEq/L   Potassium 3.2 (*) 3.5 - 5.1 mEq/L   Chloride 104  96 - 112 mEq/L   CO2 22  19 - 32 mEq/L   Glucose, Bld 85  70 - 99 mg/dL   BUN 8  6 - 23 mg/dL   Creatinine, Ser 0.86  0.50 - 1.10 mg/dL   Calcium 8.6  8.4 - 57.8 mg/dL   Total Protein 6.3  6.0 - 8.3 g/dL   Albumin 2.7 (*) 3.5 - 5.2 g/dL   AST 12  0 - 37 U/L   ALT 5  0 - 35 U/L   Alkaline Phosphatase 45  39 - 117 U/L   Total Bilirubin 0.1 (*) 0.3 - 1.2 mg/dL   GFR calc non Af Amer >90  >90 mL/min   GFR calc Af Amer >90  >90 mL/min   Comment:            The eGFR has been calculated     using the CKD EPI  equation.     This calculation has not been     validated in all clinical     situations.     eGFR's persistently     <90 mL/min signify     possible Chronic Kidney Disease.  ETHANOL     Status: None   Collection Time    11/13/12 12:55 AM      Result Value Range   Alcohol, Ethyl (B) <11  0 - 11 mg/dL   Comment:            LOWEST DETECTABLE LIMIT FOR     SERUM ALCOHOL IS 11 mg/dL     FOR MEDICAL PURPOSES ONLY  SALICYLATE LEVEL     Status: Abnormal   Collection Time    11/13/12 12:55 AM      Result Value Range   Salicylate Lvl <2.0 (*) 2.8 - 20.0 mg/dL  URINE RAPID DRUG SCREEN (HOSP PERFORMED)     Status: Abnormal   Collection Time    11/13/12  9:37 AM      Result Value Range   Opiates NONE DETECTED  NONE DETECTED   Cocaine NONE DETECTED  NONE DETECTED   Benzodiazepines NONE DETECTED  NONE DETECTED   Amphetamines NONE DETECTED  NONE DETECTED   Tetrahydrocannabinol POSITIVE (*) NONE DETECTED   Barbiturates NONE DETECTED  NONE DETECTED   Comment:            DRUG SCREEN FOR MEDICAL PURPOSES     ONLY.  IF CONFIRMATION IS NEEDED     FOR ANY PURPOSE, NOTIFY LAB     WITHIN 5 DAYS.                LOWEST DETECTABLE LIMITS     FOR URINE DRUG SCREEN     Drug Class       Cutoff (ng/mL)     Amphetamine      1000     Barbiturate      200     Benzodiazepine   200     Tricyclics       300     Opiates          300  Cocaine          300     THC              50    No results found.  Positive for anxiety, bad mood, depression, school difficulties and pregnant Blood pressure 108/71, pulse 68, temperature 98.3 F (36.8 C), temperature source Oral, resp. rate 18, last menstrual period 06/28/2012, SpO2 100.00%.   Assessment/Plan:  Depressive disorder, not otherwise specified  Second trimester pregnancy, at a 4 and half months gestation.  Cannabis abuse as per urine drug screen  Attention deficit hyperactive disorder by history  Recommendations: Patient does not meet criteria  for acute psychiatric hospitalization at this time and will be referred to the outpatient psychiatric services, especially counseling services as she does not need of medication management at this time because of for gestation. Patient has been psychiatrically stable and able to participate in outpatient psychiatric services and also has extended family support for her.  Natasha Byrd,[name] 11/14/2012, 12:27 PM

## 2012-11-14 NOTE — ED Notes (Signed)
Pt denies SI/HI/AVH.  Pt rates her depression as 5 and hopelessness as 3.  Pt states "I prayed on it and prayer changes things.  I'm going to get my house back.  I feel good today.  Nothing is wrong with me.  I just want to see my sone.  That is all."  Pt offered Tylenol for pain, however she refused.

## 2012-11-14 NOTE — [facility] Suicide Risk Assessment (Signed)
Suicide Risk Assessment  Discharge Assessment     Demographic Factors:  Adolescent or young adult and Low socioeconomic status  Mental Status Per Nursing Assessment::   On Admission:     Current Mental Status by Physician: NA  Loss Factors: Financial problems/change in socioeconomic status  Historical Factors: Family history of mental illness or substance abuse and Impulsivity  Risk Reduction Factors:   Pregnancy, Sense of responsibility to family, Religious beliefs about death, Living with another person, especially a relative, Positive social support and Positive therapeutic relationship  Continued Clinical Symptoms:  Depression:   Impulsivity Recent sense of peace/wellbeing Severe Previous Psychiatric Diagnoses and Treatments Medical Diagnoses and Treatments/Surgeries  Cognitive Features That Contribute To Risk:  Polarized thinking    Suicide Risk:  Minimal: No identifiable suicidal ideation.  Patients presenting with no risk factors but with morbid ruminations; may be classified as minimal risk based on the severity of the depressive symptoms  Discharge Diagnoses:   AXIS I:  Depressive Disorder NOS and Substance Abuse AXIS II:  Deferred AXIS III:  [redacted] weeks gestation. Past Medical History  Diagnosis Date  . Depression    AXIS IV:  economic problems, educational problems, occupational problems, other psychosocial or environmental problems and problems related to social environment AXIS V:  51-60 moderate symptoms  Plan Of Care/Follow-up recommendations:  Activity:  As tolerated Diet:  Regular  Is patient on multiple antipsychotic therapies at discharge:  No   Has Patient had three or more failed trials of antipsychotic monotherapy by history:  No  Recommended Plan for Multiple Antipsychotic Therapies: Not applicable  Malon Siddall,[name] 11/14/2012, 12:38 PM

## 2012-11-14 NOTE — Progress Notes (Signed)
Pt referred to [facility] pending review.  Pt referred to [facility] pending review, and requesting fetal heart rate.  Pt referred to [facility] pending review.   Catha Gosselin, LCSWA  [phone] .11/14/2012 1141am

## 2012-11-17 NOTE — ED Provider Notes (Signed)
Medical screening examination/treatment/procedure(s) were performed by non-physician practitioner and as supervising physician I was immediately available for consultation/collaboration.  Shelda Jakes, MD 11/17/12 0830

## 2012-11-21 ENCOUNTER — Ambulatory Visit (INDEPENDENT_AMBULATORY_CARE_PROVIDER_SITE_OTHER): Payer: Self-pay | Admitting: Obstetrics & Gynecology

## 2012-11-21 ENCOUNTER — Encounter: Payer: Self-pay | Admitting: Obstetrics & Gynecology

## 2012-11-21 ENCOUNTER — Other Ambulatory Visit: Payer: Self-pay | Admitting: Obstetrics & Gynecology

## 2012-11-21 VITALS — BP 106/70 | Temp 97.0°F | Ht 66.0 in | Wt 147.6 lb

## 2012-11-21 DIAGNOSIS — Z3492 Encounter for supervision of normal pregnancy, unspecified, second trimester: Secondary | ICD-10-CM

## 2012-11-21 DIAGNOSIS — O99340 Other mental disorders complicating pregnancy, unspecified trimester: Secondary | ICD-10-CM | POA: Insufficient documentation

## 2012-11-21 DIAGNOSIS — F329 Major depressive disorder, single episode, unspecified: Secondary | ICD-10-CM

## 2012-11-21 DIAGNOSIS — Z3490 Encounter for supervision of normal pregnancy, unspecified, unspecified trimester: Secondary | ICD-10-CM | POA: Insufficient documentation

## 2012-11-21 DIAGNOSIS — O0992 Supervision of high risk pregnancy, unspecified, second trimester: Secondary | ICD-10-CM

## 2012-11-21 DIAGNOSIS — F32A Depression, unspecified: Secondary | ICD-10-CM

## 2012-11-21 LAB — POCT URINALYSIS DIP (DEVICE)
Glucose, UA: NEGATIVE mg/dL
Hgb urine dipstick: NEGATIVE
Nitrite: NEGATIVE
Urobilinogen, UA: 4 mg/dL — ABNORMAL HIGH (ref 0.0–1.0)

## 2012-11-21 MED ORDER — PRENATAL VITAMINS 0.8 MG PO TABS: 1.0000 | ORAL_TABLET | Freq: Every day | ORAL | Status: DC

## 2012-11-21 NOTE — Progress Notes (Signed)
Patient is concerned about decreased fetal movement.  She c/o cramping, back pain, and a watery discharge.  Concerned "sac is leaking".  Has a hx of IUFD at 15mo. Gestation. Patient very concerned about IUFD.

## 2012-11-21 NOTE — Progress Notes (Signed)
  Subjective:    Natasha Byrd is a G2P1001 [redacted]w[redacted]d being seen today for her first obstetrical visit.  Her obstetrical history is significant for depression and anxiety. Patient does intend to breast feed. Pregnancy history fully reviewed. Patient reports no complaints.  Filed Vitals:   11/21/12 0901  BP: 106/70  Temp: 97 F (36.1 C)  Weight: 147 lb 9.6 oz (66.951 kg)   HISTORY:  OB History   Grav Para Term Preterm Abortions TAB SAB Ect Mult Living   2 1 1  0 0     1     # Outc Date GA Lbr Len/2nd Wgt Sex Del Anes PTL Lv   1 TRM 12/09 [redacted]w[redacted]d 23:00 7lb13oz(3.544kg) M SVD EPI  Yes   Comments: Initially twin pregnancy.  Twin A demise noted at 26 weeks, but was noted to be a remote demise as decomposed tissue identified.  12 week scan showed viable Twin A.  So demise occured early in the 2nd trimester. No other complications during pregnancy.   2 CUR               Past Medical History  Diagnosis Date  . Depression    History reviewed. No pertinent past surgical history. Family History  Problem Relation Age of Onset  . Abnormal EKG Mother   . Hypertension Mother   . Hypertension Brother   . Hypertension Maternal Grandmother     Exam    Uterus:  160  Pelvic Exam:    Perineum: No Hemorrhoids, Normal Perineum   Vulva: normal   Vagina:  normal mucosa, normal discharge   Cervix: multiparous appearance and no bleeding following Pap   Adnexa: normal adnexa and no mass, fullness, tenderness   Bony Pelvis: gynecoid  System: Breast:  normal appearance, no masses or tenderness   Skin: normal coloration and turgor, no rashes    Neurologic: normal, negative   Extremities: normal strength, tone, and muscle mass   HEENT PERRLA   Mouth/Teeth mucous membranes moist, pharynx normal without lesions and dental hygiene good   Neck supple and no masses   Cardiovascular: regular rate and rhythm   Respiratory:  appears well, vitals normal, no respiratory distress, acyanotic, normal RR   Abdomen: soft, non-tender; bowel sounds normal; no masses,  no organomegaly   Urinary: urethral meatus normal    Assessment:    Pregnancy: G2P1001 Patient Active Problem List  Diagnosis  . TOBACCO ABUSE  . ATTENTION DEFICIT, W/HYPERACTIVITY  . 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 HEADACHE, UNSPECIFIED  . Benzodiazepine abuse, continuous  . Amenorrhea  . Abdominal  pain, other specified site  . Urinary tract infection, site not specified  . Depression complicating pregnancy, antepartum  . Supervision of normal pregnancy     Plan:   No fetal testing indicated. Initial labs drawn. Continue prenatal vitamins. Problem list reviewed and updated. Genetic Screening discussed Quad Screen: ordered. Ultrasound discussed; fetal survey: ordered. Follow up in 4 weeks.  Patient is very anxious, wants her anatomy scan today.  Referred to Child psychotherapist.     Quinzell Malcomb A 11/21/2012

## 2012-11-21 NOTE — Progress Notes (Signed)
Ultrasound for anatomy scheduled 11/25/12 at 1:00 pm.  Spoke with patient about appointment.  Patient refused appointment.  States she spoke with someone who told her they were not busy at all today and could do her ultrasound today.  I explained to patient that the doctor had informed me of this and as a result I spoke with the manager of the department and they did not have any availability today or tomorrow.  The patient states she doesn't have a ride to get to the appointment for Monday.  I asked patient if she has Medicaid.  Patient states she applied 3 months ago and it hasn't come through yet.  I offered to look up status on the [facility] website.  Patient states "I told it you I don't have it.  You're just wasting your time."  Patient says she will just wait until she has the baby to come back.  Patient states she doesn't have anyway to get to the appointment on Monday.  I explained she was scheduled to see our social worker today and she may have some options for her for transportation.  The patient's body language and demeanor indicated she was becoming very upset.  I explained "I am trying to help you.  Tell me what you would like me to do and I will do it."  Patient states she wants me to cancel the appointment.  States "I'm bout to leave up out of here.  Ya'll done pissed me off."  Patient left the office.  She did not see the nutritionist or the social worker and did not get her next appointment.  I called Ultrasound and cancelled the appointment.  Patient came back to clinic after about 20 minutes.  She was accompanied by Jamesetta So from Ultrasound.  Jamesetta So states the patient doesn't have a ride home.  Explained to patient she would be next to see the social worker who could help her.  The patient stated "Ya'll are f*ing discriminating against me.  Everytime I come in here ya'll f*ing discriminate against me."  Explained to patient no one is discriminating against her that we were trying to help her.   Asked patient to have a seat and that Arline Asp would be with her very shortly.  Patient met with Arline Asp.  Ultrasound rescheduled for 11/25/12 at 12:45 pm.

## 2012-11-21 NOTE — Patient Instructions (Signed)
Pregnancy - Second Trimester The second trimester of pregnancy (3 to 6 months) is a period of rapid growth for you and your baby. At the end of the sixth month, your baby is about 9 inches long and weighs 1 1/2 pounds. You will begin to feel the baby move between 18 and 20 weeks of the pregnancy. This is called quickening. Weight gain is faster. A clear fluid (colostrum) may leak out of your breasts. You may feel small contractions of the womb (uterus). This is known as false labor or Braxton-Hicks contractions. This is like a practice for labor when the baby is ready to be born. Usually, the problems with morning sickness have usually passed by the end of your first trimester. Some women develop small dark blotches (called cholasma, mask of pregnancy) on their face that usually goes away after the baby is born. Exposure to the sun makes the blotches worse. Acne may also develop in some pregnant women and pregnant women who have acne, may find that it goes away. PRENATAL EXAMS  Blood work may continue to be done during prenatal exams. These tests are done to check on your health and the probable health of your baby. Blood work is used to follow your blood levels (hemoglobin). Anemia (low hemoglobin) is common during pregnancy. Iron and vitamins are given to help prevent this. You will also be checked for diabetes between 24 and 28 weeks of the pregnancy. Some of the previous blood tests may be repeated.  The size of the uterus is measured during each visit. This is to make sure that the baby is continuing to grow properly according to the dates of the pregnancy.  Your blood pressure is checked every prenatal visit. This is to make sure you are not getting toxemia.  Your urine is checked to make sure you do not have an infection, diabetes or protein in the urine.  Your weight is checked often to make sure gains are happening at the suggested rate. This is to ensure that both you and your baby are growing  normally.  Sometimes, an ultrasound is performed to confirm the proper growth and development of the baby. This is a test which bounces harmless sound waves off the baby so your caregiver can more accurately determine due dates. Sometimes, a specialized test is done on the amniotic fluid surrounding the baby. This test is called an amniocentesis. The amniotic fluid is obtained by sticking a needle into the belly (abdomen). This is done to check the chromosomes in instances where there is a concern about possible genetic problems with the baby. It is also sometimes done near the end of pregnancy if an early delivery is required. In this case, it is done to help make sure the baby's lungs are mature enough for the baby to live outside of the womb. CHANGES OCCURING IN THE SECOND TRIMESTER OF PREGNANCY Your body goes through many changes during pregnancy. They vary from person to person. Talk to your caregiver about changes you notice that you are concerned about.  During the second trimester, you will likely have an increase in your appetite. It is normal to have cravings for certain foods. This varies from person to person and pregnancy to pregnancy.  Your lower abdomen will begin to bulge.  You may have to urinate more often because the uterus and baby are pressing on your bladder. It is also common to get more bladder infections during pregnancy (pain with urination). You can help this by   drinking lots of fluids and emptying your bladder before and after intercourse.  You may begin to get stretch marks on your hips, abdomen, and breasts. These are normal changes in the body during pregnancy. There are no exercises or medications to take that prevent this change.  You may begin to develop swollen and bulging veins (varicose veins) in your legs. Wearing support hose, elevating your feet for 15 minutes, 3 to 4 times a day and limiting salt in your diet helps lessen the problem.  Heartburn may develop  as the uterus grows and pushes up against the stomach. Antacids recommended by your caregiver helps with this problem. Also, eating smaller meals 4 to 5 times a day helps.  Constipation can be treated with a stool softener or adding bulk to your diet. Drinking lots of fluids, vegetables, fruits, and whole grains are helpful.  Exercising is also helpful. If you have been very active up until your pregnancy, most of these activities can be continued during your pregnancy. If you have been less active, it is helpful to start an exercise program such as walking.  Hemorrhoids (varicose veins in the rectum) may develop at the end of the second trimester. Warm sitz baths and hemorrhoid cream recommended by your caregiver helps hemorrhoid problems.  Backaches may develop during this time of your pregnancy. Avoid heavy lifting, wear low heal shoes and practice good posture to help with backache problems.  Some pregnant women develop tingling and numbness of their hand and fingers because of swelling and tightening of ligaments in the wrist (carpel tunnel syndrome). This goes away after the baby is born.  As your breasts enlarge, you may have to get a bigger bra. Get a comfortable, cotton, support bra. Do not get a nursing bra until the last month of the pregnancy if you will be nursing the baby.  You may get a dark line from your belly button to the pubic area called the linea nigra.  You may develop rosy cheeks because of increase blood flow to the face.  You may develop spider looking lines of the face, neck, arms and chest. These go away after the baby is born. HOME CARE INSTRUCTIONS   It is extremely important to avoid all smoking, herbs, alcohol, and unprescribed drugs during your pregnancy. These chemicals affect the formation and growth of the baby. Avoid these chemicals throughout the pregnancy to ensure the delivery of a healthy infant.  Most of your home care instructions are the same as  suggested for the first trimester of your pregnancy. Keep your caregiver's appointments. Follow your caregiver's instructions regarding medication use, exercise and diet.  During pregnancy, you are providing food for you and your baby. Continue to eat regular, well-balanced meals. Choose foods such as meat, fish, milk and other low fat dairy products, vegetables, fruits, and whole-grain breads and cereals. Your caregiver will tell you of the ideal weight gain.  A physical sexual relationship may be continued up until near the end of pregnancy if there are no other problems. Problems could include early (premature) leaking of amniotic fluid from the membranes, vaginal bleeding, abdominal pain, or other medical or pregnancy problems.  Exercise regularly if there are no restrictions. Check with your caregiver if you are unsure of the safety of some of your exercises. The greatest weight gain will occur in the last 2 trimesters of pregnancy. Exercise will help you:  Control your weight.  Get you in shape for labor and delivery.  Lose weight   after you have the baby.  Wear a good support or jogging bra for breast tenderness during pregnancy. This may help if worn during sleep. Pads or tissues may be used in the bra if you are leaking colostrum.  Do not use hot tubs, steam rooms or saunas throughout the pregnancy.  Wear your seat belt at all times when driving. This protects you and your baby if you are in an accident.  Avoid raw meat, uncooked cheese, cat litter boxes and soil used by cats. These carry germs that can cause birth defects in the baby.  The second trimester is also a good time to visit your dentist for your dental health if this has not been done yet. Getting your teeth cleaned is OK. Use a soft toothbrush. Brush gently during pregnancy.  It is easier to loose urine during pregnancy. Tightening up and strengthening the pelvic muscles will help with this problem. Practice stopping your  urination while you are going to the bathroom. These are the same muscles you need to strengthen. It is also the muscles you would use as if you were trying to stop from passing gas. You can practice tightening these muscles up 10 times a set and repeating this about 3 times per day. Once you know what muscles to tighten up, do not perform these exercises during urination. It is more likely to contribute to an infection by backing up the urine.  Ask for help if you have financial, counseling or nutritional needs during pregnancy. Your caregiver will be able to offer counseling for these needs as well as refer you for other special needs.  Your skin may become oily. If so, wash your face with mild soap, use non-greasy moisturizer and oil or cream based makeup. MEDICATIONS AND DRUG USE IN PREGNANCY  Take prenatal vitamins as directed. The vitamin should contain 1 milligram of folic acid. Keep all vitamins out of reach of children. Only a couple vitamins or tablets containing iron may be fatal to a baby or young child when ingested.  Avoid use of all medications, including herbs, over-the-counter medications, not prescribed or suggested by your caregiver. Only take over-the-counter or prescription medicines for pain, discomfort, or fever as directed by your caregiver. Do not use aspirin.  Let your caregiver also know about herbs you may be using.  Alcohol is related to a number of birth defects. This includes fetal alcohol syndrome. All alcohol, in any form, should be avoided completely. Smoking will cause low birth rate and premature babies.  Street or illegal drugs are very harmful to the baby. They are absolutely forbidden. A baby born to an addicted mother will be addicted at birth. The baby will go through the same withdrawal an adult does. SEEK MEDICAL CARE IF:  You have any concerns or worries during your pregnancy. It is better to call with your questions if you feel they cannot wait, rather  than worry about them. SEEK IMMEDIATE MEDICAL CARE IF:   An unexplained oral temperature above 102 F (38.9 C) develops, or as your caregiver suggests.  You have leaking of fluid from the vagina (birth canal). If leaking membranes are suspected, take your temperature and tell your caregiver of this when you call.  There is vaginal spotting, bleeding, or passing clots. Tell your caregiver of the amount and how many pads are used. Light spotting in pregnancy is common, especially following intercourse.  You develop a bad smelling vaginal discharge with a change in the color from clear   to white.  You continue to feel sick to your stomach (nauseated) and have no relief from remedies suggested. You vomit blood or coffee ground-like materials.  You lose more than 2 pounds of weight or gain more than 2 pounds of weight over 1 week, or as suggested by your caregiver.  You notice swelling of your face, hands, feet, or legs.  You get exposed to [place] measles and have never had them.  You are exposed to fifth disease or chickenpox.  You develop belly (abdominal) pain. Round ligament discomfort is a common non-cancerous (benign) cause of abdominal pain in pregnancy. Your caregiver still must evaluate you.  You develop a bad headache that does not go away.  You develop fever, diarrhea, pain with urination, or shortness of breath.  You develop visual problems, blurry, or double vision.  You fall or are in a car accident or any kind of trauma.  There is mental or physical violence at home. Document Released: 07/25/2001 Document Revised: 10/23/2011 Document Reviewed: 01/27/2009 [facility] Patient Information 2013 [place], [place].   Braxton Hicks Contractions Pregnancy is commonly associated with contractions of the uterus throughout the pregnancy and can happen anytime during pregnancy. Towards the end of pregnancy (32 to 34 weeks), these contractions [facility]) can develop more often and  may become more forceful. This is not true labor because these contractions do not result in opening (dilatation) and thinning of the cervix. They are sometimes difficult to tell apart from true labor because these contractions can be forceful and people have different pain tolerances. You should not feel embarrassed if you go to the hospital with false labor. Sometimes, the only way to tell if you are in true labor is for your caregiver to follow the changes in the cervix. How to tell the difference between true and false labor:  False labor.  The contractions of false labor are usually shorter, irregular and not as hard as those of true labor.  They are often felt in the front of the lower abdomen and in the groin.  They may leave with walking around or changing positions while lying down.  They get weaker and are shorter lasting as time goes on.  These contractions are usually irregular.  They do not usually become progressively stronger, regular and closer together as with true labor.  True labor.  Contractions in true labor last 30 to 70 seconds, become very regular, usually become more intense, and increase in frequency.  They do not go away with walking.  The discomfort is usually felt in the top of the uterus and spreads to the lower abdomen and low back.  True labor can be determined by your caregiver with an exam. This will show that the cervix is dilating and getting thinner. If there are no prenatal problems or other health problems associated with the pregnancy, it is completely safe to be sent home with false labor and await the onset of true labor. HOME CARE INSTRUCTIONS   Keep up with your usual exercises and instructions.  Take medications as directed.  Keep your regular prenatal appointment.  Eat and drink lightly if you think you are going into labor.  If BH contractions are making you uncomfortable:  Change your activity position from lying down or resting  to walking/walking to resting.  Sit and rest in a tub of warm water.  Drink 2 to 3 glasses of water. Dehydration may cause B-H contractions.  Do slow and deep breathing several times an hour.  SEEK IMMEDIATE MEDICAL CARE IF:   Your contractions continue to become stronger, more regular, and closer together.  You have a gushing, burst or leaking of fluid from the vagina.  An oral temperature above 102 F (38.9 C) develops.  You have passage of blood-tinged mucus.  You develop vaginal bleeding.  You develop continuous belly (abdominal) pain.  You have low back pain that you never had before.  You feel the baby's head pushing down causing pelvic pressure.  The baby is not moving as much as it used to. Document Released: 07/31/2005 Document Revised: 10/23/2011 Document Reviewed: 01/22/2009 [facility] Patient Information 2013 [place], [place].

## 2012-11-21 NOTE — Progress Notes (Signed)
Nutrition note: 1st visit consult Pt has gained 22.6# @ [redacted]w[redacted]d, which is > expected Pt reports eating 3 meals & 3-4 snacks/d. Pt is not taking a PNV but plans to start taking one today. Pt reports having N&V and heartburn. Pt received verbal & written education on general nutrition during pregnancy. Disc tips to decrease N&V and heartburn. Disc importance of PNV. Disc wt gain goals of 25-35# or 1#/wk. Pt agrees to start taking a PNV. Pt does not have WIC but plans to apply. Pt plans to BF. F/u if referred Blondell Reveal, MS, RD, LDN

## 2012-11-22 LAB — OBSTETRIC PANEL
Eosinophils Absolute: 0.1 10*3/uL (ref 0.0–0.7)
Hemoglobin: 10.7 g/dL — ABNORMAL LOW (ref 12.0–15.0)
Hepatitis B Surface Ag: NEGATIVE
Lymphs Abs: 0.9 10*3/uL (ref 0.7–4.0)
MCH: 32.7 pg (ref 26.0–34.0)
Monocytes Relative: 9 % (ref 3–12)
Neutro Abs: 2.2 10*3/uL (ref 1.7–7.7)
Neutrophils Relative %: 63 % (ref 43–77)
Platelets: 218 10*3/uL (ref 150–400)
RBC: 3.27 MIL/uL — ABNORMAL LOW (ref 3.87–5.11)
Rh Type: POSITIVE
WBC: 3.5 10*3/uL — ABNORMAL LOW (ref 4.0–10.5)

## 2012-11-25 ENCOUNTER — Ambulatory Visit (HOSPITAL_COMMUNITY)
Admission: RE | Admit: 2012-11-25 | Discharge: 2012-11-25 | Disposition: A | Discharge status: Home / Self Care | Payer: Medicaid Other | Source: Ambulatory Visit | Attending: Obstetrics & Gynecology | Admitting: Obstetrics & Gynecology

## 2012-11-25 DIAGNOSIS — Z1389 Encounter for screening for other disorder: Secondary | ICD-10-CM | POA: Insufficient documentation

## 2012-11-25 DIAGNOSIS — Z363 Encounter for antenatal screening for malformations: Secondary | ICD-10-CM | POA: Insufficient documentation

## 2012-11-25 DIAGNOSIS — O358XX0 Maternal care for other (suspected) fetal abnormality and damage, not applicable or unspecified: Secondary | ICD-10-CM | POA: Insufficient documentation

## 2012-11-25 DIAGNOSIS — O09299 Supervision of pregnancy with other poor reproductive or obstetric history, unspecified trimester: Secondary | ICD-10-CM | POA: Insufficient documentation

## 2012-11-25 DIAGNOSIS — F329 Major depressive disorder, single episode, unspecified: Secondary | ICD-10-CM

## 2012-11-25 LAB — HEMOGLOBINOPATHY EVALUATION
Hemoglobin Other: 0 %
Hgb A2 Quant: 2.6 % (ref 2.2–3.2)
Hgb A: 97.4 % (ref 96.8–97.8)
Hgb S Quant: 0 %

## 2012-11-26 ENCOUNTER — Encounter: Payer: Self-pay | Admitting: Obstetrics & Gynecology

## 2012-12-06 ENCOUNTER — Encounter: Payer: Self-pay | Admitting: Obstetrics & Gynecology

## 2012-12-19 ENCOUNTER — Encounter: Payer: Self-pay | Admitting: Family Medicine

## 2012-12-19 ENCOUNTER — Ambulatory Visit (INDEPENDENT_AMBULATORY_CARE_PROVIDER_SITE_OTHER): Payer: Self-pay | Admitting: Family Medicine

## 2012-12-19 VITALS — BP 108/64 | Temp 98.6°F | Wt 153.3 lb

## 2012-12-19 DIAGNOSIS — F329 Major depressive disorder, single episode, unspecified: Secondary | ICD-10-CM

## 2012-12-19 DIAGNOSIS — O99342 Other mental disorders complicating pregnancy, second trimester: Secondary | ICD-10-CM

## 2012-12-19 DIAGNOSIS — O1210 Gestational proteinuria, unspecified trimester: Secondary | ICD-10-CM | POA: Insufficient documentation

## 2012-12-19 DIAGNOSIS — O26839 Pregnancy related renal disease, unspecified trimester: Secondary | ICD-10-CM

## 2012-12-19 DIAGNOSIS — O1212 Gestational proteinuria, second trimester: Secondary | ICD-10-CM

## 2012-12-19 DIAGNOSIS — O99340 Other mental disorders complicating pregnancy, unspecified trimester: Secondary | ICD-10-CM

## 2012-12-19 LAB — POCT URINALYSIS DIP (DEVICE)
Bilirubin Urine: NEGATIVE
Hgb urine dipstick: NEGATIVE
Nitrite: NEGATIVE
Protein, ur: 100 mg/dL — AB
pH: 7.5 (ref 5.0–8.0)

## 2012-12-19 NOTE — Progress Notes (Signed)
P=82, c/o pelvic pressure in the morning or if up too long.

## 2012-12-19 NOTE — Patient Instructions (Signed)
Pregnancy - Third Trimester The third trimester of pregnancy (the last 3 months) is a period of the most rapid growth for you and your baby. The baby approaches a length of 20 inches and a weight of 6 to 10 pounds. The baby is adding on fat and getting ready for life outside your body. While inside, babies have periods of sleeping and waking, suck their thumbs, and hiccups. You can often feel small contractions of the uterus. This is false labor. It is also called Braxton-Hicks contractions. This is like a practice for labor. The usual problems in this stage of pregnancy include more difficulty breathing, swelling of the hands and feet from water retention, and having to urinate more often because of the uterus and baby pressing on your bladder.  PRENATAL EXAMS  Blood work may continue to be done during prenatal exams. These tests are done to check on your health and the probable health of your baby. Blood work is used to follow your blood levels (hemoglobin). Anemia (low hemoglobin) is common during pregnancy. Iron and vitamins are given to help prevent this. You may also continue to be checked for diabetes. Some of the past blood tests may be done again.  The size of the uterus is measured during each visit. This makes sure your baby is growing properly according to your pregnancy dates.  Your blood pressure is checked every prenatal visit. This is to make sure you are not getting toxemia.  Your urine is checked every prenatal visit for infection, diabetes and protein.  Your weight is checked at each visit. This is done to make sure gains are happening at the suggested rate and that you and your baby are growing normally.  Sometimes, an ultrasound is performed to confirm the position and the proper growth and development of the baby. This is a test done that bounces harmless sound waves off the baby so your caregiver can more accurately determine due dates.  Discuss the type of pain medication  and anesthesia you will have during your labor and delivery.  Discuss the possibility and anesthesia if a Cesarean Section might be necessary.  Inform your caregiver if there is any mental or physical violence at home. Sometimes, a specialized non-stress test, contraction stress test and biophysical profile are done to make sure the baby is not having a problem. Checking the amniotic fluid surrounding the baby is called an amniocentesis. The amniotic fluid is removed by sticking a needle into the belly (abdomen). This is sometimes done near the end of pregnancy if an early delivery is required. In this case, it is done to help make sure the baby's lungs are mature enough for the baby to live outside of the womb. If the lungs are not mature and it is unsafe to deliver the baby, an injection of cortisone medication is given to the mother 1 to 2 days before the delivery. This helps the baby's lungs mature and makes it safer to deliver the baby. CHANGES OCCURING IN THE THIRD TRIMESTER OF PREGNANCY Your body goes through many changes during pregnancy. They vary from person to person. Talk to your caregiver about changes you notice and are concerned about.  During the last trimester, you have probably had an increase in your appetite. It is normal to have cravings for certain foods. This varies from person to person and pregnancy to pregnancy.  You may begin to get stretch marks on your hips, abdomen, and breasts. These are normal changes in the body   during pregnancy. There are no exercises or medications to take which prevent this change.  Constipation may be treated with a stool softener or adding bulk to your diet. Drinking lots of fluids, fiber in vegetables, fruits, and whole grains are helpful.  Exercising is also helpful. If you have been very active up until your pregnancy, most of these activities can be continued during your pregnancy. If you have been less active, it is helpful to start an  exercise program such as walking. Consult your caregiver before starting exercise programs.  Avoid all smoking, alcohol, un-prescribed drugs, herbs and "street drugs" during your pregnancy. These chemicals affect the formation and growth of the baby. Avoid chemicals throughout the pregnancy to ensure the delivery of a healthy infant.  Backache, varicose veins and hemorrhoids may develop or get worse.  You will tire more easily in the third trimester, which is normal.  The baby's movements may be stronger and more often.  You may become short of breath easily.  Your belly button may stick out.  A yellow discharge may leak from your breasts called colostrum.  You may have a bloody mucus discharge. This usually occurs a few days to a week before labor begins. HOME CARE INSTRUCTIONS   Keep your caregiver's appointments. Follow your caregiver's instructions regarding medication use, exercise, and diet.  During pregnancy, you are providing food for you and your baby. Continue to eat regular, well-balanced meals. Choose foods such as meat, fish, milk and other low fat dairy products, vegetables, fruits, and whole-grain breads and cereals. Your caregiver will tell you of the ideal weight gain.  A physical sexual relationship may be continued throughout pregnancy if there are no other problems such as early (premature) leaking of amniotic fluid from the membranes, vaginal bleeding, or belly (abdominal) pain.  Exercise regularly if there are no restrictions. Check with your caregiver if you are unsure of the safety of your exercises. Greater weight gain will occur in the last 2 trimesters of pregnancy. Exercising helps:  Control your weight.  Get you in shape for labor and delivery.  You lose weight after you deliver.  Rest a lot with legs elevated, or as needed for leg cramps or low back pain.  Wear a good support or jogging bra for breast tenderness during pregnancy. This may help if worn  during sleep. Pads or tissues may be used in the bra if you are leaking colostrum.  Do not use hot tubs, steam rooms, or saunas.  Wear your seat belt when driving. This protects you and your baby if you are in an accident.  Avoid raw meat, cat litter boxes and soil used by cats. These carry germs that can cause birth defects in the baby.  It is easier to loose urine during pregnancy. Tightening up and strengthening the pelvic muscles will help with this problem. You can practice stopping your urination while you are going to the bathroom. These are the same muscles you need to strengthen. It is also the muscles you would use if you were trying to stop from passing gas. You can practice tightening these muscles up 10 times a set and repeating this about 3 times per day. Once you know what muscles to tighten up, do not perform these exercises during urination. It is more likely to cause an infection by backing up the urine.  Ask for help if you have financial, counseling or nutritional needs during pregnancy. Your caregiver will be able to offer counseling for these   needs as well as refer you for other special needs.  Make a list of emergency phone numbers and have them available.  Plan on getting help from family or friends when you go home from the hospital.  Make a trial run to the hospital.  Take prenatal classes with the father to understand, practice and ask questions about the labor and delivery.  Prepare the baby's room/nursery.  Do not travel out of the city unless it is absolutely necessary and with the advice of your caregiver.  Wear only low or no heal shoes to have better balance and prevent falling. MEDICATIONS AND DRUG USE IN PREGNANCY  Take prenatal vitamins as directed. The vitamin should contain 1 milligram of folic acid. Keep all vitamins out of reach of children. Only a couple vitamins or tablets containing iron may be fatal to a baby or young child when  ingested.  Avoid use of all medications, including herbs, over-the-counter medications, not prescribed or suggested by your caregiver. Only take over-the-counter or prescription medicines for pain, discomfort, or fever as directed by your caregiver. Do not use aspirin, ibuprofen (Motrin, Advil, Nuprin) or naproxen (Aleve) unless OK'd by your caregiver.  Let your caregiver also know about herbs you may be using.  Alcohol is related to a number of birth defects. This includes fetal alcohol syndrome. All alcohol, in any form, should be avoided completely. Smoking will cause low birth rate and premature babies.  Street/illegal drugs are very harmful to the baby. They are absolutely forbidden. A baby born to an addicted mother will be addicted at birth. The baby will go through the same withdrawal an adult does. SEEK MEDICAL CARE IF: You have any concerns or worries during your pregnancy. It is better to call with your questions if you feel they cannot wait, rather than worry about them. DECISIONS ABOUT CIRCUMCISION You may or may not know the sex of your baby. If you know your baby is a boy, it may be time to think about circumcision. Circumcision is the removal of the foreskin of the penis. This is the skin that covers the sensitive end of the penis. There is no proven medical need for this. Often this decision is made on what is popular at the time or based upon religious beliefs and social issues. You can discuss these issues with your caregiver or pediatrician. SEEK IMMEDIATE MEDICAL CARE IF:   An unexplained oral temperature above 102 F (38.9 C) develops, or as your caregiver suggests.  You have leaking of fluid from the vagina (birth canal). If leaking membranes are suspected, take your temperature and tell your caregiver of this when you call.  There is vaginal spotting, bleeding or passing clots. Tell your caregiver of the amount and how many pads are used.  You develop a bad smelling  vaginal discharge with a change in the color from clear to white.  You develop vomiting that lasts more than 24 hours.  You develop chills or fever.  You develop shortness of breath.  You develop burning on urination.  You loose more than 2 pounds of weight or gain more than 2 pounds of weight or as suggested by your caregiver.  You notice sudden swelling of your face, hands, and feet or legs.  You develop belly (abdominal) pain. Round ligament discomfort is a common non-cancerous (benign) cause of abdominal pain in pregnancy. Your caregiver still must evaluate you.  You develop a severe headache that does not go away.  You develop visual   problems, blurred or double vision.  If you have not felt your baby move for more than 1 hour. If you think the baby is not moving as much as usual, eat something with sugar in it and lie down on your left side for an hour. The baby should move at least 4 to 5 times per hour. Call right away if your baby moves less than that.  You fall, are in a car accident or any kind of trauma.  There is mental or physical violence at home. Document Released: 07/25/2001 Document Revised: 10/23/2011 Document Reviewed: 01/27/2009 [employer] Patient Information 2013 [employer].  Breastfeeding Deciding to breastfeed is one of the best choices you can make for you and your baby. The information that follows gives a brief overview of the benefits of breastfeeding as well as common topics surrounding breastfeeding. BENEFITS OF BREASTFEEDING For the baby  The first milk (colostrum) helps the baby's digestive system function better.   There are antibodies in the mother's milk that help the baby fight off infections.   The baby has a lower incidence of asthma, allergies, and sudden infant death syndrome (SIDS).   The nutrients in breast milk are better for the baby than infant formulas, and breast milk helps the baby's brain grow better.   Babies who  breastfeed have less gas, colic, and constipation.  For the mother  Breastfeeding helps develop a very special bond between the mother and her baby.   Breastfeeding is convenient, always available at the correct temperature, and costs nothing.   Breastfeeding burns calories in the mother and helps her lose weight that was gained during pregnancy.   Breastfeeding makes the uterus contract back down to normal size faster and slows bleeding following delivery.   Breastfeeding mothers have a lower risk of developing breast cancer.  BREASTFEEDING FREQUENCY  A healthy, full-term baby may breastfeed as often as every hour or space his or her feedings to every 3 hours.   Watch your baby for signs of hunger. Nurse your baby if he or she shows signs of hunger. How often you nurse will vary from baby to baby.   Nurse as often as the baby requests, or when you feel the need to reduce the fullness of your breasts.   Awaken the baby if it has been 3 4 hours since the last feeding.   Frequent feeding will help the mother make more milk and will help prevent problems, such as sore nipples and engorgement of the breasts.  BABY'S POSITION AT THE BREAST  Whether lying down or sitting, be sure that the baby's tummy is facing your tummy.   Support the breast with 4 fingers underneath the breast and the thumb above. Make sure your fingers are well away from the nipple and baby's mouth.   Stroke the baby's lips gently with your finger or nipple.   When the baby's mouth is open wide enough, place all of your nipple and as much of the areola as possible into your baby's mouth.   Pull the baby in close so the tip of the nose and the baby's cheeks touch the breast during the feeding.  FEEDINGS AND SUCTION  The length of each feeding varies from baby to baby and from feeding to feeding.   The baby must suck about 2 3 minutes for your milk to get to him or her. This is called a "let down."  For this reason, allow the baby to feed on each breast as   long as he or she wants. Your baby will end the feeding when he or she has received the right balance of nutrients.   To break the suction, put your finger into the corner of the baby's mouth and slide it between his or her gums before removing your breast from his or her mouth. This will help prevent sore nipples.  HOW TO TELL WHETHER YOUR BABY IS GETTING ENOUGH BREAST MILK. Wondering whether or not your baby is getting enough milk is a common concern among mothers. You can be assured that your baby is getting enough milk if:   Your baby is actively sucking and you hear swallowing.   Your baby seems relaxed and satisfied after a feeding.   Your baby nurses at least 8 12 times in a 24 hour time period. Nurse your baby until he or she unlatches or falls asleep at the first breast (at least 10 20 minutes), then offer the second side.   Your baby is wetting 5 6 disposable diapers (6 8 cloth diapers) in a 24 hour period by 5 6 days of age.   Your baby is having at least 3 4 stools every 24 hours for the first 6 weeks. The stool should be soft and yellow.   Your baby should gain 4 7 ounces per week after he or she is 4 days old.   Your breasts feel softer after nursing.  REDUCING BREAST ENGORGEMENT  In the first week after your baby is born, you may experience signs of breast engorgement. When breasts are engorged, they feel heavy, warm, full, and may be tender to the touch. You can reduce engorgement if you:   Nurse frequently, every 2 3 hours. Mothers who breastfeed early and often have fewer problems with engorgement.   Place light ice packs on your breasts for 10 20 minutes between feedings. This reduces swelling. Wrap the ice packs in a lightweight towel to protect your skin. Bags of frozen vegetables work well for this purpose.   Take a warm shower or apply warm, moist heat to your breast for 5 10 minutes just before  each feeding. This increases circulation and helps the milk flow.   Gently massage your breast before and during the feeding. Using your finger tips, massage from the chest wall towards your nipple in a circular motion.   Make sure that the baby empties at least one breast at every feeding before switching sides.   Use a breast pump to empty the breasts if your baby is sleepy or not nursing well. You may also want to pump if you are returning to work oryou feel you are getting engorged.   Avoid bottle feeds, pacifiers, or supplemental feedings of water or juice in place of breastfeeding. Breast milk is all the food your baby needs. It is not necessary for your baby to have water or formula. In fact, to help your breasts make more milk, it is best not to give your baby supplemental feedings during the early weeks.   Be sure the baby is latched on and positioned properly while breastfeeding.   Wear a supportive bra, avoiding underwire styles.   Eat a balanced diet with enough fluids.   Rest often, relax, and take your prenatal vitamins to prevent fatigue, stress, and anemia.  If you follow these suggestions, your engorgement should improve in 24 48 hours. If you are still experiencing difficulty, call your lactation consultant or caregiver.  CARING FOR YOURSELF Take care of your   breasts  Bathe or shower daily.   Avoid using soap on your nipples.   Start feedings on your left breast at one feeding and on your right breast at the next feeding.   You will notice an increase in your milk supply 2 5 days after delivery. You may feel some discomfort from engorgement, which makes your breasts very firm and often tender. Engorgement "peaks" out within 24 48 hours. In the meantime, apply warm moist towels to your breasts for 5 10 minutes before feeding. Gentle massage and expression of some milk before feeding will soften your breasts, making it easier for your baby to latch on.    Wear a well-fitting nursing bra, and air dry your nipples for a 3 4minutes after each feeding.   Only use cotton bra pads.   Only use pure lanolin on your nipples after nursing. You do not need to wash it off before feeding the baby again. Another option is to express a few drops of breast milk and gently massage it into your nipples.  Take care of yourself  Eat well-balanced meals and nutritious snacks.   Drinking milk, fruit juice, and water to satisfy your thirst (about 8 glasses a day).   Get plenty of rest.  Avoid foods that you notice affect the baby in a bad way.  SEEK MEDICAL CARE IF:   You have difficulty with breastfeeding and need help.   You have a hard, red, sore area on your breast that is accompanied by a fever.   Your baby is too sleepy to eat well or is having trouble sleeping.   Your baby is wetting less than 6 diapers a day, by 5 days of age.   Your baby's skin or white part of his or her eyes is more yellow than it was in the hospital.   You feel depressed.  Document Released: 07/31/2005 Document Revised: 01/30/2012 Document Reviewed: 10/29/2011 [employer] Patient Information 2013 [employer].  

## 2012-12-19 NOTE — Progress Notes (Signed)
2+ protein on second visit--will check cx--if negative-->needs 24 hour urine for protein.

## 2012-12-21 LAB — CULTURE, OB URINE

## 2013-01-16 ENCOUNTER — Encounter: Payer: Self-pay | Admitting: Obstetrics & Gynecology

## 2013-01-23 ENCOUNTER — Encounter: Payer: Self-pay | Admitting: Obstetrics and Gynecology

## 2013-01-29 ENCOUNTER — Encounter (HOSPITAL_COMMUNITY): Payer: Self-pay

## 2013-01-29 ENCOUNTER — Inpatient Hospital Stay (HOSPITAL_COMMUNITY)
Admission: AD | Admit: 2013-01-29 | Discharge: 2013-01-29 | Disposition: A | Discharge status: Home / Self Care | Payer: Medicaid Other | Source: Ambulatory Visit | Attending: Obstetrics and Gynecology | Admitting: Obstetrics and Gynecology

## 2013-01-29 DIAGNOSIS — O368190 Decreased fetal movements, unspecified trimester, not applicable or unspecified: Secondary | ICD-10-CM | POA: Insufficient documentation

## 2013-01-29 DIAGNOSIS — N760 Acute vaginitis: Secondary | ICD-10-CM

## 2013-01-29 DIAGNOSIS — O4700 False labor before 37 completed weeks of gestation, unspecified trimester: Secondary | ICD-10-CM | POA: Insufficient documentation

## 2013-01-29 DIAGNOSIS — B9689 Other specified bacterial agents as the cause of diseases classified elsewhere: Secondary | ICD-10-CM | POA: Insufficient documentation

## 2013-01-29 DIAGNOSIS — A499 Bacterial infection, unspecified: Secondary | ICD-10-CM | POA: Insufficient documentation

## 2013-01-29 DIAGNOSIS — O2390 Unspecified genitourinary tract infection in pregnancy, unspecified trimester: Secondary | ICD-10-CM | POA: Insufficient documentation

## 2013-01-29 LAB — URINALYSIS, ROUTINE W REFLEX MICROSCOPIC
Hgb urine dipstick: NEGATIVE
Nitrite: NEGATIVE
Specific Gravity, Urine: 1.025 (ref 1.005–1.030)
Urobilinogen, UA: 2 mg/dL — ABNORMAL HIGH (ref 0.0–1.0)

## 2013-01-29 LAB — WET PREP, GENITAL
Trich, Wet Prep: NONE SEEN
Yeast Wet Prep HPF POC: NONE SEEN

## 2013-01-29 MED ORDER — METRONIDAZOLE 500 MG PO TABS: 500.0000 mg | ORAL_TABLET | Freq: Two times a day (BID) | ORAL | Status: DC

## 2013-01-29 NOTE — MAU Note (Signed)
Patient is in with c/o no fetal movement since last night and contractions that does not sunside. She states that her abdomen remains tight/hard for hours. She denies vaginal bleeding or lof. Abdomen is soft and non-tender

## 2013-01-29 NOTE — MAU Provider Note (Signed)
  History     CSN: [id number]  Arrival date and time: 01/29/13 1529   First Provider Initiated Contact with Patient 01/29/13 1614      Chief Complaint  Patient presents with  . Decreased Fetal Movement  . Contractions   HPI Ms No is a 27yo G2P1001 at 30.5wks who presents for eval of tightening in her abd and decreased FM today. Denies leak or bldg. She receives care at the [facility] and her pregnancy has been essentially unremarkable other than proteinuria at prenatal visits x 2.  OB History   Grav Para Term Preterm Abortions TAB SAB Ect Mult Living   2 1 1  0 0     1      Past Medical History  Diagnosis Date  . Depression     History reviewed. No pertinent past surgical history.  Family History  Problem Relation Age of Onset  . Abnormal EKG Mother   . Hypertension Mother   . Hypertension Brother   . Hypertension Maternal Grandmother     History  Substance Use Topics  . Smoking status: Former Games developer  . Smokeless tobacco: Not on file  . Alcohol Use: No     Comment: Weekends    Allergies: No Known Allergies  Prescriptions prior to admission  Medication Sig Dispense Refill  . Prenatal Multivit-Min-Fe-FA (PRENATAL VITAMINS) 0.8 MG tablet Take 1 tablet by mouth daily.  30 tablet  12    ROS Physical Exam   Blood pressure 114/60, pulse 79, temperature 98.1 F (36.7 C), temperature source Oral, resp. rate 16, last menstrual period 06/28/2012.  Physical Exam  Constitutional: She is oriented to person, place, and time. She appears well-developed.  HENT:  Head: Normocephalic.  Cardiovascular: Normal rate.   Respiratory: Effort normal.  GI:  FHR 135-140 +accels, no decels Toco: ctx q 3-6 mins  Genitourinary: Vagina normal.  Copious white vag d/c; cx C/L  Musculoskeletal: Normal range of motion.  Neurological: She is alert and oriented to person, place, and time.  Skin: Skin is warm and dry.  Psychiatric: She has a normal mood and affect. Her behavior is normal.  Thought content normal.   Urinalysis    Component Value Date/Time   COLORURINE YELLOW 01/29/2013 1546   APPEARANCEUR CLEAR 01/29/2013 1546   LABSPEC 1.025 01/29/2013 1546   PHURINE 7.0 01/29/2013 1546   GLUCOSEU NEGATIVE 01/29/2013 1546   HGBUR NEGATIVE 01/29/2013 1546   BILIRUBINUR NEGATIVE 01/29/2013 1546   BILIRUBINUR SMALL 09/25/2012 0902   KETONESUR NEGATIVE 01/29/2013 1546   PROTEINUR NEGATIVE 01/29/2013 1546   UROBILINOGEN 2.0* 01/29/2013 1546   UROBILINOGEN 2.0 09/25/2012 0902   NITRITE NEGATIVE 01/29/2013 1546   NITRITE NEG 09/25/2012 0902   LEUKOCYTESUR NEGATIVE 01/29/2013 1546    Wet prep: many clue, mod WBC, bacteria TNTC  MAU Course  Procedures   Assessment and Plan  IUP at 30.5 Decreased FM Preterm contractions without cx change BV  D/C home with preterm labor precautions Rx Flagyl 500mg  BID x 7d F/U as scheduled at the [facility], [facility] 01/29/2013, 4:22 PM

## 2013-02-01 ENCOUNTER — Encounter (HOSPITAL_COMMUNITY): Payer: Self-pay

## 2013-02-01 ENCOUNTER — Emergency Department (HOSPITAL_COMMUNITY)
Admission: EM | Admit: 2013-02-01 | Discharge: 2013-02-01 | Discharge status: Against Medical Advice | Payer: Medicaid Other | Attending: Emergency Medicine | Admitting: Emergency Medicine

## 2013-02-01 DIAGNOSIS — Z8659 Personal history of other mental and behavioral disorders: Secondary | ICD-10-CM | POA: Insufficient documentation

## 2013-02-01 DIAGNOSIS — Z87891 Personal history of nicotine dependence: Secondary | ICD-10-CM | POA: Insufficient documentation

## 2013-02-01 DIAGNOSIS — S0501XA Injury of conjunctiva and corneal abrasion without foreign body, right eye, initial encounter: Secondary | ICD-10-CM

## 2013-02-01 DIAGNOSIS — Y9389 Activity, other specified: Secondary | ICD-10-CM | POA: Insufficient documentation

## 2013-02-01 DIAGNOSIS — O9989 Other specified diseases and conditions complicating pregnancy, childbirth and the puerperium: Secondary | ICD-10-CM | POA: Insufficient documentation

## 2013-02-01 DIAGNOSIS — X58XXXA Exposure to other specified factors, initial encounter: Secondary | ICD-10-CM | POA: Insufficient documentation

## 2013-02-01 DIAGNOSIS — H538 Other visual disturbances: Secondary | ICD-10-CM | POA: Insufficient documentation

## 2013-02-01 DIAGNOSIS — Y9289 Other specified places as the place of occurrence of the external cause: Secondary | ICD-10-CM | POA: Insufficient documentation

## 2013-02-01 DIAGNOSIS — H5710 Ocular pain, unspecified eye: Secondary | ICD-10-CM | POA: Insufficient documentation

## 2013-02-01 DIAGNOSIS — S058X9A Other injuries of unspecified eye and orbit, initial encounter: Secondary | ICD-10-CM | POA: Insufficient documentation

## 2013-02-01 DIAGNOSIS — H5789 Other specified disorders of eye and adnexa: Secondary | ICD-10-CM | POA: Insufficient documentation

## 2013-02-01 DIAGNOSIS — H53149 Visual discomfort, unspecified: Secondary | ICD-10-CM | POA: Insufficient documentation

## 2013-02-01 MED ORDER — FLUORESCEIN SODIUM 1 MG OP STRP
1.0000 | ORAL_STRIP | Freq: Once | OPHTHALMIC | Status: AC
  Administered 2013-02-01: 1 via OPHTHALMIC
  Filled 2013-02-01: qty 1

## 2013-02-01 MED ORDER — TETRACAINE HCL 0.5 % OP SOLN
1.0000 [drp] | Freq: Once | OPHTHALMIC | Status: AC
  Administered 2013-02-01: 1 [drp] via OPHTHALMIC
  Filled 2013-02-01: qty 2

## 2013-02-01 MED ORDER — TOBRAMYCIN 0.3 % OP SOLN
2.0000 [drp] | Freq: Once | OPHTHALMIC | Status: AC
  Administered 2013-02-01: 2 [drp] via OPHTHALMIC
  Filled 2013-02-01: qty 5

## 2013-02-01 NOTE — ED Notes (Signed)
Patient eye is tearing, patient requesting pain medication.  Jerral Ralph will see patient on POD C, verbal order given for tetracaine.  Patient is 7 months pregnant, spoke with May, pharmacist who reports tetracaine is safe to give.  Peter PA notified. Patient made aware of the potential effects and has consented to tetracaine.

## 2013-02-01 NOTE — ED Notes (Signed)
Patient presents with right eye pain and tearing since this afternoon.  Eye appears red and tearing present to right eye.  No object visible to right eye.

## 2013-02-01 NOTE — ED Notes (Signed)
Pt refused near acuity exam. RN aware.

## 2013-02-01 NOTE — ED Provider Notes (Signed)
History     CSN: [id number]  Arrival date & time 02/01/13  2122   First MD Initiated Contact with Patient 02/01/13 2234      Chief Complaint  Patient presents with  . Eye Pain   HPI  History provided by the patient. Patient is a 27 year old female G2 P1 currently 7 months pregnant who presents with complaints of right eye pain and redness. Symptoms first began earlier this morning and afternoon after waking up. She denies any trauma or injury to the eye. She does recall may be rubbing her eye. Since that time has become more red and painful. She is sensitive to light and has had tearing and slight blurriness. She has not used any treatments for her pain or symptoms. Denies having similar symptoms before. She does not wear any corrective lenses or contacts. She denies any associated fever, chills or sweats. No rhinorrhea, congestion, or cough.    Past Medical History  Diagnosis Date  . Depression     History reviewed. No pertinent past surgical history.  Family History  Problem Relation Age of Onset  . Abnormal EKG Mother   . Hypertension Mother   . Hypertension Brother   . Hypertension Maternal Grandmother     History  Substance Use Topics  . Smoking status: Former Games developer  . Smokeless tobacco: Not on file  . Alcohol Use: No     Comment: Weekends    OB History   Grav Para Term Preterm Abortions TAB SAB Ect Mult Living   2 1 1  0 0     1      Review of Systems  Constitutional: Negative for fever, chills and diaphoresis.  HENT: Negative for congestion and rhinorrhea.   Eyes: Positive for photophobia, pain, discharge, redness and visual disturbance.  Gastrointestinal: Negative for nausea, vomiting and abdominal pain.  Genitourinary: Negative for vaginal bleeding and vaginal discharge.  All other systems reviewed and are negative.    Allergies  Review of patient's allergies indicates no known allergies.  Home Medications   Current Outpatient Rx  Name  Route   Sig  Dispense  Refill  . metroNIDAZOLE (FLAGYL) 500 MG tablet   Oral   Take 1 tablet (500 mg total) by mouth 2 (two) times daily.   14 tablet   0   . prenatal vitamin w/FE, FA (PRENATAL 1 + 1) 27-1 MG TABS   Oral   Take 1 tablet by mouth daily.           BP 137/82  Pulse 82  Temp(Src) 98.9 F (37.2 C) (Oral)  SpO2 97%  LMP 06/28/2012  Physical Exam  Nursing note and vitals reviewed. Constitutional: She is oriented to person, place, and time. She appears well-developed and well-nourished. No distress.  HENT:  Head: Normocephalic.  Mouth/Throat: Oropharynx is clear and moist.  Eyes: EOM are normal. Pupils are equal, round, and reactive to light. No foreign bodies found. Right eye exhibits no chemosis and no exudate. No foreign body present in the right eye. Left eye exhibits no chemosis and no exudate. No foreign body present in the left eye. Right conjunctiva is injected. Right conjunctiva has no hemorrhage. Left conjunctiva is not injected. Left conjunctiva has no hemorrhage.  Slit lamp exam:      The right eye shows fluorescein uptake.    There is a small band of fluorescein uptake over the central portion of the cornea.  Cardiovascular: Normal rate and regular rhythm.   Pulmonary/Chest: Effort normal and  breath sounds normal. No respiratory distress. She has no wheezes.  Abdominal: Soft.  Gravid  Musculoskeletal: Normal range of motion.  Neurological: She is alert and oriented to person, place, and time.  Skin: Skin is warm and dry. No rash noted.  Psychiatric: She has a normal mood and affect. Her behavior is normal.    ED Course  Procedures       1. Corneal abrasion, right, initial encounter       MDM  11:00 PM patient seen and evaluated. Patient appears uncomfortable but in no acute distress.  The eye room is currently being occupied by another patient. The patient is becoming very impatient and is wishing to leave the emergency room. I have instructed  her that I wish to complete her evaluation under the slit-lamp for better evaluation and to determine any other cause of her eye complaint. There was some diffuse fluorescein uptake under the [place] lamp but I could not determine a definitive abrasion or not. Patient was given tobramycin eyedrops to prevent or treat any possible infectious cause. She is choosing to leave AMA despite being told the risks of worsening eye problem which may result in worsening vision or permanent blindness. She has been given a referral to follow up with ophthalmology specialist for further evaluation. She understands she may return at any time for further evaluation.      Angus Seller, PA-C 02/01/13 2322

## 2013-02-01 NOTE — ED Notes (Signed)
Pt requesting to leave AMA. EDPA provided f/u instructions for pt.

## 2013-02-02 NOTE — ED Provider Notes (Signed)
Medical screening examination/treatment/procedure(s) were performed by non-physician practitioner and as supervising physician I was immediately available for consultation/collaboration.   Glynn Octave, MD 02/02/13 [phone]

## 2013-02-03 NOTE — MAU Provider Note (Signed)
Attestation of Attending Supervision of Advanced Practitioner (CNM/NP): Evaluation and management procedures were performed by the Advanced Practitioner under my supervision and collaboration.  I have reviewed the Advanced Practitioner's note and chart, and I agree with the management and plan.  Charlott Calvario 02/03/2013 11:59 AM   

## 2013-02-06 ENCOUNTER — Encounter: Payer: Self-pay | Admitting: Advanced Practice Midwife

## 2013-02-17 ENCOUNTER — Ambulatory Visit (INDEPENDENT_AMBULATORY_CARE_PROVIDER_SITE_OTHER): Payer: Medicaid Other | Admitting: Obstetrics & Gynecology

## 2013-02-17 ENCOUNTER — Encounter: Payer: Self-pay | Admitting: *Deleted

## 2013-02-17 VITALS — BP 109/70 | Temp 97.8°F | Wt 161.0 lb

## 2013-02-17 DIAGNOSIS — N760 Acute vaginitis: Secondary | ICD-10-CM

## 2013-02-17 DIAGNOSIS — R51 Headache: Secondary | ICD-10-CM

## 2013-02-17 DIAGNOSIS — B9689 Other specified bacterial agents as the cause of diseases classified elsewhere: Secondary | ICD-10-CM

## 2013-02-17 DIAGNOSIS — Z3480 Encounter for supervision of other normal pregnancy, unspecified trimester: Secondary | ICD-10-CM

## 2013-02-17 DIAGNOSIS — A499 Bacterial infection, unspecified: Secondary | ICD-10-CM

## 2013-02-17 DIAGNOSIS — Z3493 Encounter for supervision of normal pregnancy, unspecified, third trimester: Secondary | ICD-10-CM

## 2013-02-17 DIAGNOSIS — O99340 Other mental disorders complicating pregnancy, unspecified trimester: Secondary | ICD-10-CM

## 2013-02-17 DIAGNOSIS — O99343 Other mental disorders complicating pregnancy, third trimester: Secondary | ICD-10-CM

## 2013-02-17 DIAGNOSIS — F329 Major depressive disorder, single episode, unspecified: Secondary | ICD-10-CM

## 2013-02-17 DIAGNOSIS — Z23 Encounter for immunization: Secondary | ICD-10-CM

## 2013-02-17 LAB — POCT URINALYSIS DIP (DEVICE)
Glucose, UA: NEGATIVE mg/dL
Ketones, ur: NEGATIVE mg/dL
Leukocytes, UA: NEGATIVE
Protein, ur: NEGATIVE mg/dL
Urobilinogen, UA: 1 mg/dL (ref 0.0–1.0)

## 2013-02-17 MED ORDER — TETANUS-DIPHTH-ACELL PERTUSSIS 5-2.5-18.5 LF-MCG/0.5 IM SUSP: 0.5000 mL | Freq: Once | INTRAMUSCULAR | Status: DC

## 2013-02-17 MED ORDER — METRONIDAZOLE 0.75 % VA GEL: 1.0000 | Freq: Every day | VAGINAL | Status: DC

## 2013-02-17 NOTE — Patient Instructions (Signed)
Return to clinic for any obstetric concerns or go to MAU for evaluation  

## 2013-02-17 NOTE — Progress Notes (Signed)
Pulse- 76.  Patient reports edema in hands/feet and pelvic pain/pressure.   She reports not being able to take Flagyl for BV due to nausea and vomiting.  Vaginal metronidazole prescribed. Patient is unable to drink glucola drink, will buy Snickers funsize bars (5 bars) at next visit ~ 50 g of sugar then do 1 hr GTT. Other third trimester labs at that visit also.  No other complaints or concerns.  Fetal movement and labor precautions reviewed.

## 2013-02-18 ENCOUNTER — Encounter (HOSPITAL_COMMUNITY): Payer: Self-pay | Admitting: Emergency Medicine

## 2013-02-18 ENCOUNTER — Emergency Department (HOSPITAL_COMMUNITY)
Admission: EM | Admit: 2013-02-18 | Discharge: 2013-02-18 | Disposition: A | Discharge status: Home / Self Care | Payer: Medicaid Other | Attending: Emergency Medicine | Admitting: Emergency Medicine

## 2013-02-18 DIAGNOSIS — Z87891 Personal history of nicotine dependence: Secondary | ICD-10-CM | POA: Insufficient documentation

## 2013-02-18 DIAGNOSIS — Z79899 Other long term (current) drug therapy: Secondary | ICD-10-CM | POA: Insufficient documentation

## 2013-02-18 DIAGNOSIS — W010XXA Fall on same level from slipping, tripping and stumbling without subsequent striking against object, initial encounter: Secondary | ICD-10-CM | POA: Insufficient documentation

## 2013-02-18 DIAGNOSIS — Y92009 Unspecified place in unspecified non-institutional (private) residence as the place of occurrence of the external cause: Secondary | ICD-10-CM | POA: Insufficient documentation

## 2013-02-18 DIAGNOSIS — S3630XA Unspecified injury of stomach, initial encounter: Secondary | ICD-10-CM | POA: Insufficient documentation

## 2013-02-18 DIAGNOSIS — Z8659 Personal history of other mental and behavioral disorders: Secondary | ICD-10-CM | POA: Insufficient documentation

## 2013-02-18 DIAGNOSIS — O9989 Other specified diseases and conditions complicating pregnancy, childbirth and the puerperium: Secondary | ICD-10-CM | POA: Insufficient documentation

## 2013-02-18 DIAGNOSIS — Y9389 Activity, other specified: Secondary | ICD-10-CM | POA: Insufficient documentation

## 2013-02-18 MED ORDER — ACETAMINOPHEN 500 MG PO TABS
1000.0000 mg | ORAL_TABLET | Freq: Once | ORAL | Status: AC
  Administered 2013-02-18: 1000 mg via ORAL
  Filled 2013-02-18: qty 2

## 2013-02-18 NOTE — ED Provider Notes (Signed)
   History    CSN: [id number] Arrival date & time 02/18/13  0054  First MD Initiated Contact with Patient 02/18/13 0105     Chief Complaint  Patient presents with  . Fall    pt is pregnant.    (Consider location/radiation/quality/duration/timing/severity/associated sxs/prior Treatment) The history is provided by the patient.  Natasha Byrd is a 27 y.o. female hx of depression, [redacted] week pregnant here with fall. She tripped and fell on her stomach earlier tonight. She then felt the baby kick very hard but then didn't move since then. Denies vaginal bleeding or discharge. Has prenatal care for this baby.   Past Medical History  Diagnosis Date  . Depression    History reviewed. No pertinent past surgical history. Family History  Problem Relation Age of Onset  . Abnormal EKG Mother   . Hypertension Mother   . Hypertension Brother   . Hypertension Maternal Grandmother    History  Substance Use Topics  . Smoking status: Former Games developer  . Smokeless tobacco: Not on file  . Alcohol Use: No     Comment: Weekends   OB History   Grav Para Term Preterm Abortions TAB SAB Ect Mult Living   2 1 1  0 0     1     Review of Systems  Gastrointestinal: Positive for abdominal pain.  All other systems reviewed and are negative.    Allergies  Review of patient's allergies indicates no known allergies.  Home Medications   Current Outpatient Rx  Name  Route  Sig  Dispense  Refill  . metroNIDAZOLE (METROGEL) 0.75 % vaginal gel   Vaginal   Place 1 Applicatorful vaginally at bedtime. Apply one applicatorful to vagina at bedtime for 5 days   70 g   1   . prenatal vitamin w/FE, FA (PRENATAL 1 + 1) 27-1 MG TABS   Oral   Take 1 tablet by mouth daily.          BP 110/63  Pulse 82  Temp(Src) 97.7 F (36.5 C) (Oral)  Resp 18  SpO2 99%  LMP 06/28/2012 Physical Exam  Nursing note and vitals reviewed. Constitutional: She is oriented to person, place, and time. She appears  well-developed and well-nourished.  Comfortable, anxious   HENT:  Head: Normocephalic and atraumatic.  Mouth/Throat: Oropharynx is clear and moist.  Eyes: Conjunctivae are normal. Pupils are equal, round, and reactive to light.  Neck: Normal range of motion. Neck supple.  Cardiovascular: Normal rate.   Pulmonary/Chest: Effort normal.  Abdominal: Soft.  Gravid uterus, nontender   Musculoskeletal: Normal range of motion.  Neurological: She is alert and oriented to person, place, and time.  Skin: Skin is warm.  Psychiatric: She has a normal mood and affect. Her behavior is normal. Judgment and thought content normal.    ED Course  Procedures (including critical care time) Labs Reviewed - No data to display No results found. No diagnosis found.  MDM  Natasha Byrd is a 27 y.o. female [redacted] weeks pregnant here with s/p fall. OB nurse at bedside. Patient on the baby monitor. OB nurse called on call OB doctor, who recommend 4 hr monitoring.   5 AM She finished 4 hr monitoring, tracings reassuring. OB nurse performed pelvic exam, os closed. Patient is stable for d/c. Can take tylenol prn pain. Will f/u with OB outpatient.    Richardean Canal, MD 02/18/13 [phone]

## 2013-02-18 NOTE — ED Notes (Addendum)
Pt reports she tripped and fell tonight on her stomach. Pt now reports abdominal and back pain. States she felt the baby kick really hard right after but has not felt her move since. Denies vaginal bleeding or drainage.

## 2013-02-18 NOTE — ED Notes (Signed)
Rapid Response OB RN at the bedside 

## 2013-02-18 NOTE — ED Notes (Signed)
Pt to be monitored by RR OB RN for 4 hours, then reevaluated.

## 2013-02-24 ENCOUNTER — Encounter: Payer: Self-pay | Admitting: Obstetrics & Gynecology

## 2013-02-24 ENCOUNTER — Telehealth: Payer: Self-pay | Admitting: Obstetrics & Gynecology

## 2013-02-24 NOTE — Telephone Encounter (Signed)
Called patient to inform her of missed appointment. Left message with someone for her to call.

## 2013-03-18 ENCOUNTER — Inpatient Hospital Stay (HOSPITAL_COMMUNITY)
Admission: AD | Admit: 2013-03-18 | Discharge: 2013-03-18 | Disposition: A | Discharge status: Home / Self Care | Payer: Medicaid Other | Source: Ambulatory Visit | Attending: Obstetrics & Gynecology | Admitting: Obstetrics & Gynecology

## 2013-03-18 ENCOUNTER — Encounter (HOSPITAL_COMMUNITY): Payer: Self-pay

## 2013-03-18 DIAGNOSIS — O479 False labor, unspecified: Secondary | ICD-10-CM | POA: Insufficient documentation

## 2013-03-18 DIAGNOSIS — R109 Unspecified abdominal pain: Secondary | ICD-10-CM | POA: Insufficient documentation

## 2013-03-18 DIAGNOSIS — M549 Dorsalgia, unspecified: Secondary | ICD-10-CM

## 2013-03-18 DIAGNOSIS — O471 False labor at or after 37 completed weeks of gestation: Secondary | ICD-10-CM

## 2013-03-18 LAB — RAPID URINE DRUG SCREEN, HOSP PERFORMED: Opiates: NOT DETECTED

## 2013-03-18 LAB — URINALYSIS, ROUTINE W REFLEX MICROSCOPIC
Ketones, ur: 15 mg/dL — AB
Leukocytes, UA: NEGATIVE
Nitrite: NEGATIVE
Protein, ur: NEGATIVE mg/dL
pH: 6.5 (ref 5.0–8.0)

## 2013-03-18 MED ORDER — ACETAMINOPHEN 500 MG PO TABS: 1000.0000 mg | ORAL_TABLET | Freq: Once | ORAL | Status: DC

## 2013-03-18 MED ORDER — ACETAMINOPHEN 500 MG PO TABS: 1000.0000 mg | ORAL_TABLET | Freq: Four times a day (QID) | ORAL | Status: DC | PRN

## 2013-03-18 NOTE — Progress Notes (Signed)
Dr Waynetta Sandy notified of patient

## 2013-03-18 NOTE — MAU Note (Signed)
Patient is in with c/o of intense intermittent abdominal pain (she states that she is unsure if its contractions, she states that the pain brings her to tears). She denies vaginal bleeding or lof. She reports good fetal movement.

## 2013-03-18 NOTE — MAU Provider Note (Signed)
History     CSN: [id number]  Arrival date and time: 03/18/13 1257   None     Chief Complaint  Patient presents with  . Abdominal Pain   HPI 27 y.o. G2P1001 at [redacted]w[redacted]d presents to MAU for abdominal pain. She complains of mid back pain along spine and lower abdominal pain that is like period cramps. Started last night, woke her up, and this morning she felt like the back pain has been continuous. The abdominal pain is intermittent, at its most frequent occurred every 5 minutes, but since in the MAU says she hasn't really felt it. +FM in last 5 minutes. Denies gush of fluid, bleeding, says she lost her mucous plug 3 days ago. Patient says she should be more than 37 weeks since her last visit she said they told her she was 37 then. Last visit was 02/17/13, in the chart documented as [redacted]w[redacted]d at that time. Denies chest pain, says she has been short of breath for over a month and lying down makes it better, +heartburn, no nausea/vomiting, no fevers/chills. Denies headache, changes in vision, does have lightheadedness which she gets at least 1x a week, was seen 02/18/13 in ED for a fall.  Prenatal care: Care at [facility], poor follow up. - Quad normal - Normal anatomy - GTT not done  OB History   Grav Para Term Preterm Abortions TAB SAB Ect Mult Living   2 1 1  0 0     1    Previous SVD, had a fetal twin demise in early 2nd trimester  Past Medical History  Diagnosis Date  . Depression     History reviewed. No pertinent past surgical history.  Family History  Problem Relation Age of Onset  . Abnormal EKG Mother   . Hypertension Mother   . Hypertension Brother   . Hypertension Maternal Grandmother     History  Substance Use Topics  . Smoking status: Former Games developer  . Smokeless tobacco: Not on file  . Alcohol Use: No     Comment: Weekends    Allergies: No Known Allergies  Facility-administered medications prior to admission  Medication Dose Route Frequency Provider Last Rate Last Dose  .  TDaP (BOOSTRIX) injection 0.5 mL  0.5 mL Intramuscular Once Tereso Newcomer, MD       Prescriptions prior to admission  Medication Sig Dispense Refill  . metroNIDAZOLE (METROGEL) 0.75 % vaginal gel Place 1 Applicatorful vaginally at bedtime. Apply one applicatorful to vagina at bedtime for 5 days  70 g  1  . prenatal vitamin w/FE, FA (PRENATAL 1 + 1) 27-1 MG TABS Take 1 tablet by mouth daily.        ROS negative except for above Physical Exam   Blood pressure 119/69, pulse 106, temperature 98.9 F (37.2 C), temperature source Oral, resp. rate 18, last menstrual period 06/28/2012, SpO2 98.00%.  Physical Exam General appearance: cooperative and no distress, slow to respond to questions and sometimes requires repeat of question to get correct answer Head: Normocephalic, without obvious abnormality, atraumatic Lungs: clear to auscultation bilaterally, poor inspiratory effort Heart: regular rate and rhythm, S1, S2 normal, no murmur, click, rub or gallop Back: nontender to palpation and percussion on spine Abdomen: gravid, diffusely tender to palpation Extremities: no edema, redness or tenderness in the calves or thighs Pulses: 2+ and symmetric PT Skin: warm and dry   FHT: 140bpm, mod var, 15x15 accel, no decels Toco: 1 contraction over course of 1 hour MAU Course  Procedures  Orthostatics:  Filed Vitals:   03/18/13 1318 03/18/13 1356 03/18/13 1358 03/18/13 1359  BP: 119/69 108/64 111/68 114/65  Pulse: 106 77 83 90  Temp: 98.9 F (37.2 C)     TempSrc: Oral     Resp: 18     SpO2: 98%       MDM UA, UDS, orthostatics  Assessment and Plan  27 y.o. G2P1001 at [redacted]w[redacted]d   No regular contraction pattern FHT reassuring Told to drink more fluids, can try Tylenol for pain Patient was stable for discharge but did not want to wait for instructions  Patient left MAU Against Medical Advice  Natasha Byrd 03/18/2013, 1:25 PM   Results for orders placed during the hospital encounter of  03/18/13 (from the past 24 hour(s))  URINALYSIS, ROUTINE W REFLEX MICROSCOPIC     Status: Abnormal   Collection Time    03/18/13  2:07 PM      Result Value Range   Color, Urine YELLOW  YELLOW   APPearance HAZY (*) CLEAR   Specific Gravity, Urine >1.030 (*) 1.005 - 1.030   pH 6.5  5.0 - 8.0   Glucose, UA NEGATIVE  NEGATIVE mg/dL   Hgb urine dipstick NEGATIVE  NEGATIVE   Bilirubin Urine NEGATIVE  NEGATIVE   Ketones, ur 15 (*) NEGATIVE mg/dL   Protein, ur NEGATIVE  NEGATIVE mg/dL   Urobilinogen, UA 2.0 (*) 0.0 - 1.0 mg/dL   Nitrite NEGATIVE  NEGATIVE   Leukocytes, UA NEGATIVE  NEGATIVE  URINE RAPID DRUG SCREEN (HOSP PERFORMED)     Status: None   Collection Time    03/18/13  2:07 PM      Result Value Range   Opiates NONE DETECTED  NONE DETECTED   Cocaine NONE DETECTED  NONE DETECTED   Benzodiazepines NONE DETECTED  NONE DETECTED   Amphetamines NONE DETECTED  NONE DETECTED   Tetrahydrocannabinol NONE DETECTED  NONE DETECTED   Barbiturates NONE DETECTED  NONE DETECTED    Evaluation and management procedures were performed by Resident physician under my supervision/collaboration. Chart reviewed, patient not examined by me and I agree with management and plan.Determined that she is not in labor and has reactive FHR tracing.  Follow-up Information   Follow up with [facility] On 03/20/2013. (It is very important to keep your regularly scheduled appointment on Thursday)    Contact information:   [address] [place] [address] [phone]    Danae Orleans, CNM 03/18/2013 5:56 PM

## 2013-03-20 ENCOUNTER — Ambulatory Visit (INDEPENDENT_AMBULATORY_CARE_PROVIDER_SITE_OTHER): Payer: Medicaid Other | Admitting: Family Medicine

## 2013-03-20 VITALS — BP 119/73 | Temp 97.8°F | Wt 167.6 lb

## 2013-03-20 DIAGNOSIS — F329 Major depressive disorder, single episode, unspecified: Secondary | ICD-10-CM

## 2013-03-20 DIAGNOSIS — O99340 Other mental disorders complicating pregnancy, unspecified trimester: Secondary | ICD-10-CM

## 2013-03-20 LAB — POCT URINALYSIS DIP (DEVICE)
Bilirubin Urine: NEGATIVE
Glucose, UA: NEGATIVE mg/dL
Hgb urine dipstick: NEGATIVE
Ketones, ur: NEGATIVE mg/dL
Specific Gravity, Urine: 1.02 (ref 1.005–1.030)

## 2013-03-20 LAB — OB RESULTS CONSOLE GC/CHLAMYDIA
Chlamydia: NEGATIVE
Gonorrhea: NEGATIVE

## 2013-03-20 NOTE — Progress Notes (Signed)
Cutlures today--feels breech--schedule for primary C-section--declines attempt at ECV.

## 2013-03-20 NOTE — Progress Notes (Signed)
Pulse- 76  Edema-feet  Pain/pressure- pelvic Pt went to MAU for contractions.  Pt wants to be checked

## 2013-03-20 NOTE — Patient Instructions (Addendum)
Cesarean Delivery  Cesarean delivery is the birth of a baby through a cut (incision) in the abdomen and womb (uterus).  LET YOUR CAREGIVER KNOW ABOUT:  Complicationsinvolving the pregnancy.  Allergies.  Medicines taken including herbs, eyedrops, over-the-counter medicines, and creams.  Use of steroids (by mouth or creams).  Previous problems with anesthetics or numbing medicine.  Previous surgery.  History of blood clots.  History of bleeding or blood problems.  Other health problems. RISKS AND COMPLICATIONS   Bleeding.  Infection.  Blood clots.  Injury to surrounding organs.  Anesthesia problems.  Injury to the baby. BEFORE THE PROCEDURE   A tube (Foley catheter) will be placed in your bladder. The Foley catheter drains the urine from your bladder into a bag. This keeps your bladder empty during surgery.  An intravenous access tube (IV) will be placed in your arm.  Hair may be removed from your pubic area and your lower abdomen. This is to prevent infection in the incision site.  You may be given an antacid medicine to drink. This will prevent acid contents in your stomach from going into your lungs if you vomit during the surgery.  You may be given an antibiotic medicine to prevent infection. PROCEDURE   You may be given medicine to numb the lower half of your body (regional anesthetic). If you were in labor, you may have already had an epidural in place which can be used in both labor and cesarean delivery. You may possibly be given medicine to make you sleep (general anesthetic) though this is not as common.  An incision will be made in your abdomen that extends to your uterus. There are 2 basic kinds of incisions:  The horizontal (transverse) incision. Horizontal incisions are used for most routine cesarean deliveries.  The vertical (up and down) incision. This is less commonly used. This is most often reserved for women who have a serious complication  (extreme prematurity) or under emergency situations.  The horizontal and vertical incisions may both be used at the same time. However, this is very uncommon.  Your baby will then be delivered. AFTER THE PROCEDURE   If you were awake during the surgery, you will see your baby right away. If you were asleep, you will see your baby as soon as you are awake.  You may breastfeed your baby after surgery.  You may be able to get up and walk the same day as the surgery. If you need to stay in bed for a period of time, you will receive help to turn, cough, and take deep breaths after surgery. This helps prevent lung problems such as pneumonia.  Do not get out of bed alone the first time after surgery. You will need help getting out of bed until you are able to do this by yourself.  You may be able to shower the day after your cesarean delivery. After the bandage (dressing) is taken off the incision site, a nurse will assist you to shower, if you like.  You will have pneumatic compressing hose placed on your feet or lower legs. These hose are used to prevent blood clots. When you are up and walking regularly, they will no longer be necessary.  Do not cross your legs when you sit.  Save any blood clots that you pass. If you pass a clot while on the toilet, do not flush it. Call for the nurse. Tell the nurse if you think you are bleeding too much or passing too many   clots.  Start drinking liquids and eating food as directed by your caregiver. If your stomach is not ready, drinking and eating too soon can cause an increase in bloating and swelling of your intestine and abdomen. This is very uncomfortable.  You will be given medicine as needed. Let your caregivers know if you are hurting. They want you to be comfortable. You may also be given an antibiotic to prevent an infection.  Your IV will be taken out when you are drinking a reasonable amount of fluids. The Foley catheter is taken out when  you are up and walking.  If your blood type is Rh negative and your baby's blood type is Rh positive, you will be given a shot of anti-D immune globulin. This shot prevents you from having Rh problems with a future pregnancy. You should get the shot even if you had your tubes tied (tubal ligation).  If you are allowed to take the baby for a walk, place the baby in the bassinet and push it. Do not carry your baby in your arms. Document Released: 07/31/2005 Document Revised: 10/23/2011 Document Reviewed: 11/25/2010 [facility] Patient Information 2014 [place], [place].  Breastfeeding A change in hormones during your pregnancy causes growth of your breast tissue and an increase in number and size of milk ducts. The hormone prolactin allows proteins, sugars, and fats from your blood supply to make breast milk in your milk-producing glands. The hormone progesterone prevents breast milk from being released before the birth of your baby. After the birth of your baby, your progesterone level decreases allowing breast milk to be released. Thoughts of your baby, as well as his or her sucking or crying, can stimulate the release of milk from the milk-producing glands. Deciding to breastfeed (nurse) is one of the best choices you can make for you and your baby. The information that follows gives a brief review of the benefits, as well as other important skills to know about breastfeeding. BENEFITS OF BREASTFEEDING For your baby  The first milk (colostrum) helps your baby's digestive system function better.   There are antibodies in your milk that help your baby fight off infections.   Your baby has a lower incidence of asthma, allergies, and sudden infant death syndrome (SIDS).   The nutrients in breast milk are better for your baby than infant formulas.  Breast milk improves your baby's brain development.   Your baby will have less gas, colic, and constipation.  Your baby is less likely to develop  other conditions, such as childhood obesity, asthma, or diabetes mellitus. For you  Breastfeeding helps develop a very special bond between you and your baby.   Breastfeeding is convenient, always available at the correct temperature, and costs nothing.   Breastfeeding helps to burn calories and helps you lose the weight gained during pregnancy.   Breastfeeding makes your uterus contract back down to normal size faster and slows bleeding following delivery.   Breastfeeding mothers have a lower risk of developing osteoporosis or breast or ovarian cancer later in life.  BREASTFEEDING FREQUENCY  A healthy, full-term baby may breastfeed as often as every hour or space his or her feedings to every 3 hours. Breastfeeding frequency will vary from baby to baby.   Newborns should be fed no less than every 2 3 hours during the day and every 4 5 hours during the night. You should breastfeed a minimum of 8 feedings in a 24 hour period.  Awaken your baby to breastfeed if it  has been 3 4 hours since the last feeding.  Breastfeed when you feel the need to reduce the fullness of your breasts or when your newborn shows signs of hunger. Signs that your baby may be hungry include:  Increased alertness or activity.  Stretching.  Movement of the head from side to side.  Movement of the head and opening of the mouth when the corner of the mouth or cheek is stroked (rooting).  Increased sucking sounds, smacking lips, cooing, sighing, or squeaking.  Hand-to-mouth movements.  Increased sucking of fingers or hands.  Fussing.  Intermittent crying.  Signs of extreme hunger will require calming and consoling before you try to feed your baby. Signs of extreme hunger may include:  Restlessness.  A loud, strong cry.  Screaming.  Frequent feeding will help you make more milk and will help prevent problems, such as sore nipples and engorgement of the breasts.  BREASTFEEDING   Whether lying  down or sitting, be sure that the baby's abdomen is facing your abdomen.   Support your breast with 4 fingers under your breast and your thumb above your nipple. Make sure your fingers are well away from your nipple and your baby's mouth.   Stroke your baby's lips gently with your finger or nipple.   When your baby's mouth is open wide enough, place all of your nipple and as much of the colored area around your nipple (areola) as possible into your baby's mouth.  More areola should be visible above his or her upper lip than below his or her lower lip.  Your baby's tongue should be between his or her lower gum and your breast.  Ensure that your baby's mouth is correctly positioned around the nipple (latched). Your baby's lips should create a seal on your breast.  Signs that your baby has effectively latched onto your nipple include:  Tugging or sucking without pain.  Swallowing heard between sucks.  Absent click or smacking sound.  Muscle movement above and in front of his or her ears with sucking.  Your baby must suck about 2 3 minutes in order to get your milk. Allow your baby to feed on each breast as long as he or she wants. Nurse your baby until he or she unlatches or falls asleep at the first breast, then offer the second breast.  Signs that your baby is full and satisfied include:  A gradual decrease in the number of sucks or complete cessation of sucking.  Falling asleep.  Extension or relaxation of his or her body.  Retention of a small amount of milk in his or her mouth.  Letting go of your breast by himself or herself.  Signs of effective breastfeeding in you include:  Breasts that have increased firmness, weight, and size prior to feeding.  Breasts that are softer after nursing.  Increased milk volume, as well as a change in milk consistency and color by the 5th day of breastfeeding.  Breast fullness relieved by breastfeeding.  Nipples are not sore,  cracked, or bleeding.  If needed, break the suction by putting your finger into the corner of your baby's mouth and sliding your finger between his or her gums. Then, remove your breast from his or her mouth.  It is common for babies to spit up a small amount after a feeding.  Babies often swallow air during feeding. This can make babies fussy. Burping your baby between breasts can help with this.  Vitamin D supplements are recommended for  babies who get only breast milk.  Avoid using a pacifier during your baby's first 4 6 weeks.  Avoid supplemental feedings of water, formula, or juice in place of breastfeeding. Breast milk is all the food your baby needs. It is not necessary for your baby to have water or formula. Your breasts will make more milk if supplemental feedings are avoided during the early weeks. HOW TO TELL WHETHER YOUR BABY IS GETTING ENOUGH BREAST MILK Wondering whether or not your baby is getting enough milk is a common concern among mothers. You can be assured that your baby is getting enough milk if:   Your baby is actively sucking and you hear swallowing.   Your baby seems relaxed and satisfied after a feeding.   Your baby nurses at least 8 12 times in a 24 hour time period.  During the first 1 43 days of age:  Your baby is wetting at least 3 5 diapers in a 24 hour period. The urine should be clear and pale yellow.  Your baby is having at least 3 4 stools in a 24 hour period. The stool should be soft and yellow.  At 48 70 days of age, your baby is having at least 3 6 stools in a 24 hour period. The stool should be seedy and yellow by 22 days of age.  Your baby has a weight loss less than 7 10% during the first 6 days of age.  Your baby does not lose weight after 40 49 days of age.  Your baby gains 4 7 ounces each week after he or she is 61 days of age.  Your baby gains weight by 9 days of age and is back to birth weight within 2 weeks. ENGORGEMENT In the first week  after your baby is born, you may experience extremely full breasts (engorgement). When engorged, your breasts may feel heavy, warm, or tender to the touch. Engorgement peaks within 24 48 hours after delivery of your baby.  Engorgement may be reduced by:  Continuing to breastfeed.  Increasing the frequency of breastfeeding.  Taking warm showers or applying warm, moist heat to your breasts just before each feeding. This increases circulation and helps the milk flow.   Gently massaging your breast before and during the feedings. With your fingertips, massage from your chest wall towards your nipple in a circular motion.   Ensuring that your baby empties at least one breast at every feeding. It also helps to start the next feeding on the opposite breast.   Expressing breast milk by hand or by using a breast pump to empty the breasts if your baby is sleepy, or not nursing well. You may also want to express milk if you are returning to work oryou feel you are getting engorged.  Ensuring your baby is latched on and positioned properly while breastfeeding. If you follow these suggestions, your engorgement should improve in 24 48 hours. If you are still experiencing difficulty, call your lactation consultant or caregiver.  CARING FOR YOURSELF Take care of your breasts.  Bathe or shower daily.   Avoid using soap on your nipples.   Wear a supportive bra. Avoid wearing underwire style bras.  Air dry your nipples for a 3 after each feeding.   Use only cotton bra pads to absorb breast milk leakage. Leaking of breast milk between feedings is normal.   Use only pure lanolin on your nipples after nursing. You do not need to wash it off before  feeding your baby again. Another option is to express a few drops of breast milk and gently massage that milk into your nipples.  Continue breast self-awareness checks. Take care of yourself.  Eat healthy foods. Alternate 3 meals with 3  snacks.  Avoid foods that you notice affect your baby in a bad way.  Drink milk, fruit juice, and water to satisfy your thirst (about 8 glasses a day).   Rest often, relax, and take your prenatal vitamins to prevent fatigue, stress, and anemia.  Avoid chewing and smoking tobacco.  Avoid alcohol and drug use.  Take over-the-counter and prescribed medicine only as directed by your caregiver or pharmacist. You should always check with your caregiver or pharmacist before taking any new medicine, vitamin, or herbal supplement.  Know that pregnancy is possible while breastfeeding. If desired, talk to your caregiver about family planning and safe birth control methods that may be used while breastfeeding. SEEK MEDICAL CARE IF:   You feel like you want to stop breastfeeding or have become frustrated with breastfeeding.  You have painful breasts or nipples.  Your nipples are cracked or bleeding.  Your breasts are red, tender, or warm.  You have a swollen area on either breast.  You have a fever or chills.  You have nausea or vomiting.  You have drainage from your nipples.  Your breasts do not become full before feedings by the 5th day after delivery.  You feel sad and depressed.  Your baby is too sleepy to eat well.  Your baby is having trouble sleeping.   Your baby is wetting less than 3 diapers in a 24 hour period.  Your baby has less than 3 stools in a 24 hour period.  Your baby's skin or the white part of his or her eyes becomes more yellow.   Your baby is not gaining weight by 63 days of age. MAKE SURE YOU:   Understand these instructions.  Will watch your condition.  Will get help right away if you are not doing well or get worse. Document Released: 07/31/2005 Document Revised: 04/24/2012 Document Reviewed: 03/06/2012 [facility] Patient Information 2014 [place], [place].

## 2013-03-21 LAB — GC/CHLAMYDIA PROBE AMP
CT Probe RNA: NEGATIVE
GC Probe RNA: NEGATIVE

## 2013-03-24 ENCOUNTER — Encounter: Payer: Self-pay | Admitting: Family Medicine

## 2013-03-24 NOTE — MAU Provider Note (Signed)
Attestation of Attending Supervision of Advanced Practitioner (CNM/NP): Evaluation and management procedures were performed by the Advanced Practitioner under my supervision and collaboration. I have reviewed the Advanced Practitioner's note and chart, and I agree with the management and plan.  Chari Parmenter H. 10:13 PM   

## 2013-03-26 ENCOUNTER — Encounter (HOSPITAL_COMMUNITY): Payer: Self-pay | Admitting: *Deleted

## 2013-03-26 ENCOUNTER — Encounter (HOSPITAL_COMMUNITY): Payer: Self-pay | Admitting: Anesthesiology

## 2013-03-26 ENCOUNTER — Inpatient Hospital Stay (HOSPITAL_COMMUNITY)
Admission: AD | Admit: 2013-03-26 | Discharge: 2013-03-29 | DRG: 775 | Disposition: A | Discharge status: Home / Self Care | Payer: Medicaid Other | Source: Ambulatory Visit | Attending: Obstetrics & Gynecology | Admitting: Obstetrics & Gynecology

## 2013-03-26 ENCOUNTER — Inpatient Hospital Stay (HOSPITAL_COMMUNITY): Payer: Medicaid Other | Admitting: Anesthesiology

## 2013-03-26 ENCOUNTER — Encounter (HOSPITAL_COMMUNITY)
Admission: RE | Admit: 2013-03-26 | Discharge: 2013-03-26 | Disposition: A | Discharge status: Home / Self Care | Payer: Medicaid Other | Source: Ambulatory Visit | Attending: Obstetrics & Gynecology | Admitting: Obstetrics & Gynecology

## 2013-03-26 DIAGNOSIS — Z3493 Encounter for supervision of normal pregnancy, unspecified, third trimester: Secondary | ICD-10-CM

## 2013-03-26 DIAGNOSIS — O321XX0 Maternal care for breech presentation, not applicable or unspecified: Principal | ICD-10-CM | POA: Diagnosis present

## 2013-03-26 DIAGNOSIS — IMO0001 Reserved for inherently not codable concepts without codable children: Secondary | ICD-10-CM

## 2013-03-26 DIAGNOSIS — O320XX1 Maternal care for unstable lie, fetus 1: Secondary | ICD-10-CM

## 2013-03-26 DIAGNOSIS — O320XX0 Maternal care for unstable lie, not applicable or unspecified: Secondary | ICD-10-CM | POA: Diagnosis present

## 2013-03-26 DIAGNOSIS — F329 Major depressive disorder, single episode, unspecified: Secondary | ICD-10-CM

## 2013-03-26 HISTORY — DX: Headache, unspecified: R51.9

## 2013-03-26 HISTORY — DX: Attention-deficit hyperactivity disorder, unspecified type: F90.9

## 2013-03-26 HISTORY — DX: Headache: R51

## 2013-03-26 HISTORY — DX: Personal history of other mental and behavioral disorders: Z86.59

## 2013-03-26 HISTORY — DX: Urinary tract infection, site not specified: N39.0

## 2013-03-26 HISTORY — DX: Sedative, hypnotic or anxiolytic abuse, uncomplicated: F13.10

## 2013-03-26 HISTORY — DX: Homicidal ideations: R45.850

## 2013-03-26 LAB — RPR: RPR Ser Ql: NONREACTIVE

## 2013-03-26 LAB — CBC
HCT: 35.8 % — ABNORMAL LOW (ref 36.0–46.0)
Hemoglobin: 12.1 g/dL (ref 12.0–15.0)
RBC: 3.78 MIL/uL — ABNORMAL LOW (ref 3.87–5.11)

## 2013-03-26 MED ORDER — LACTATED RINGERS IV SOLN: 500.0000 mL | Freq: Once | INTRAVENOUS | Status: DC

## 2013-03-26 MED ORDER — BUTORPHANOL TARTRATE 1 MG/ML IJ SOLN: 1.0000 mg | INTRAMUSCULAR | Status: DC | PRN

## 2013-03-26 MED ORDER — LIDOCAINE HCL (PF) 1 % IJ SOLN
INTRAMUSCULAR | Status: DC | PRN
  Administered 2013-03-26 (×4): 4 mL

## 2013-03-26 MED ORDER — FLEET ENEMA 7-19 GM/118ML RE ENEM: 1.0000 | ENEMA | Freq: Every day | RECTAL | Status: DC | PRN

## 2013-03-26 MED ORDER — DIPHENHYDRAMINE HCL 50 MG/ML IJ SOLN: 12.5000 mg | INTRAMUSCULAR | Status: DC | PRN

## 2013-03-26 MED ORDER — TERBUTALINE SULFATE 1 MG/ML IJ SOLN: 0.2500 mg | Freq: Once | INTRAMUSCULAR | Status: AC | PRN

## 2013-03-26 MED ORDER — PHENYLEPHRINE 40 MCG/ML (10ML) SYRINGE FOR IV PUSH (FOR BLOOD PRESSURE SUPPORT)
80.0000 ug | PREFILLED_SYRINGE | INTRAVENOUS | Status: DC | PRN
  Filled 2013-03-26: qty 2

## 2013-03-26 MED ORDER — ONDANSETRON HCL 4 MG/2ML IJ SOLN: 4.0000 mg | Freq: Four times a day (QID) | INTRAMUSCULAR | Status: DC | PRN

## 2013-03-26 MED ORDER — OXYCODONE-ACETAMINOPHEN 5-325 MG PO TABS
1.0000 | ORAL_TABLET | ORAL | Status: DC | PRN
Start: 2013-03-26 — End: 2013-03-27

## 2013-03-26 MED ORDER — EPHEDRINE 5 MG/ML INJ
10.0000 mg | INTRAVENOUS | Status: DC | PRN
  Filled 2013-03-26: qty 2

## 2013-03-26 MED ORDER — LIDOCAINE HCL (PF) 1 % IJ SOLN
INTRAMUSCULAR | Status: AC
  Filled 2013-03-26: qty 30

## 2013-03-26 MED ORDER — OXYTOCIN 40 UNITS IN LACTATED RINGERS INFUSION - SIMPLE MED: 62.5000 mL/h | INTRAVENOUS | Status: DC

## 2013-03-26 MED ORDER — LACTATED RINGERS IV SOLN: 500.0000 mL | INTRAVENOUS | Status: DC | PRN

## 2013-03-26 MED ORDER — PHENYLEPHRINE 40 MCG/ML (10ML) SYRINGE FOR IV PUSH (FOR BLOOD PRESSURE SUPPORT)
80.0000 ug | PREFILLED_SYRINGE | INTRAVENOUS | Status: DC | PRN
  Filled 2013-03-26: qty 5
  Filled 2013-03-26: qty 2

## 2013-03-26 MED ORDER — CITRIC ACID-SODIUM CITRATE 334-500 MG/5ML PO SOLN: 30.0000 mL | ORAL | Status: DC | PRN

## 2013-03-26 MED ORDER — ZOLPIDEM TARTRATE 5 MG PO TABS: 5.0000 mg | ORAL_TABLET | Freq: Every evening | ORAL | Status: DC | PRN

## 2013-03-26 MED ORDER — FENTANYL 2.5 MCG/ML BUPIVACAINE 1/10 % EPIDURAL INFUSION (WH - ANES)
14.0000 mL/h | INTRAMUSCULAR | Status: DC | PRN
  Administered 2013-03-26 (×2): 14 mL/h via EPIDURAL
  Filled 2013-03-26 (×2): qty 125

## 2013-03-26 MED ORDER — ACETAMINOPHEN 325 MG PO TABS: 650.0000 mg | ORAL_TABLET | ORAL | Status: DC | PRN

## 2013-03-26 MED ORDER — OXYTOCIN 40 UNITS IN LACTATED RINGERS INFUSION - SIMPLE MED
1.0000 m[IU]/min | INTRAVENOUS | Status: DC
  Administered 2013-03-26: 2 m[IU]/min via INTRAVENOUS

## 2013-03-26 MED ORDER — OXYTOCIN 40 UNITS IN LACTATED RINGERS INFUSION - SIMPLE MED
1.0000 m[IU]/min | INTRAVENOUS | Status: DC
  Filled 2013-03-26: qty 1000

## 2013-03-26 MED ORDER — EPHEDRINE 5 MG/ML INJ
10.0000 mg | INTRAVENOUS | Status: DC | PRN
  Filled 2013-03-26: qty 2
  Filled 2013-03-26: qty 4

## 2013-03-26 MED ORDER — OXYTOCIN BOLUS FROM INFUSION
500.0000 mL | INTRAVENOUS | Status: DC
  Administered 2013-03-27: 500 mL via INTRAVENOUS

## 2013-03-26 MED ORDER — LACTATED RINGERS IV SOLN
INTRAVENOUS | Status: DC
  Administered 2013-03-26 (×2): via INTRAVENOUS

## 2013-03-26 MED ORDER — IBUPROFEN 600 MG PO TABS: 600.0000 mg | ORAL_TABLET | Freq: Four times a day (QID) | ORAL | Status: DC | PRN

## 2013-03-26 NOTE — Anesthesia Preprocedure Evaluation (Signed)
Anesthesia Evaluation  Patient identified by MRN, date of birth, ID band Patient awake    Reviewed: Allergy & Precautions, H&P , NPO status , Patient's Chart, lab work & pertinent test results, reviewed documented beta blocker date and time   History of Anesthesia Complications Negative for: history of anesthetic complications  Airway Mallampati: II TM Distance: >3 FB Neck ROM: full    Dental  (+) Teeth Intact   Pulmonary neg pulmonary ROS,  breath sounds clear to auscultation        Cardiovascular negative cardio ROS  Rhythm:regular Rate:Normal     Neuro/Psych  Headaches, PSYCHIATRIC DISORDERS (depression, ADHD)    GI/Hepatic negative GI ROS, Neg liver ROS,   Endo/Other  negative endocrine ROS  Renal/GU negative Renal ROS  negative genitourinary   Musculoskeletal   Abdominal   Peds  Hematology negative hematology ROS (+)   Anesthesia Other Findings   Reproductive/Obstetrics (+) Pregnancy                           Anesthesia Physical Anesthesia Plan  ASA: II  Anesthesia Plan: Epidural   Post-op Pain Management:    Induction:   Airway Management Planned:   Additional Equipment:   Intra-op Plan:   Post-operative Plan:   Informed Consent: I have reviewed the patients History and Physical, chart, labs and discussed the procedure including the risks, benefits and alternatives for the proposed anesthesia with the patient or authorized representative who has indicated his/her understanding and acceptance.     Plan Discussed with:   Anesthesia Plan Comments:         Anesthesia Quick Evaluation

## 2013-03-26 NOTE — MAU Provider Note (Signed)
Please refer to H & P for admission details.  UGONNA  ANYANWU, MD, FACOG Attending Obstetrician & Gynecologist Faculty Practice, [facility]   

## 2013-03-26 NOTE — Pre-Procedure Instructions (Signed)
Unable to interview patient for PAT assessment due to her c/o abdominal pain, ? contractions. Stated she had brownish discharge last pm.Dr. Macon Large notified and pt taken to MAU for evaluation.

## 2013-03-26 NOTE — MAU Note (Signed)
Vertex presentation confirmed by sonosite U/S by Dr. Macon Large.

## 2013-03-26 NOTE — Progress Notes (Signed)
Natasha Byrd is a 27 y.o. G2P1001 at [redacted]w[redacted]d admitted for labor with augmentation due to unstable lie.   Subjective:  Pt is comfortable with epidural. No LOF, VB. +FM  Objective: BP 109/62  Pulse 79  Temp(Src) 97.9 F (36.6 C) (Oral)  Resp 20  Ht 5\' 6"  (1.676 m)  Wt 77.837 kg (171 lb 9.6 oz)  BMI 27.71 kg/m2  SpO2 100%  LMP 06/23/2012      FHT:  FHR: 135 bpm, variability: moderate,  accelerations:  Present,  decelerations:  Absent UC:   2-5 SVE:   Dilation: 4 Effacement (%): 70 Station: -2 Exam by:: Dr. Reola Calkins  Labs: Lab Results  Component Value Date   WBC 5.0 03/26/2013   HGB 12.1 03/26/2013   HCT 35.8* 03/26/2013   MCV 94.7 03/26/2013   PLT 172 03/26/2013    Assessment / Plan: Augmentation of labor, progressing well  Labor: AROM performed with return of clear fluid and some bloody show. cont pit as per protocol Fetal Wellbeing:  Category I Pain Control:  Epidural I/D:  n/a Anticipated MOD:  NSVD  Natasha Byrd 03/26/2013, 7:25 PM

## 2013-03-26 NOTE — Anesthesia Procedure Notes (Signed)
Epidural Patient location during procedure: OB Start time: 03/26/2013 2:14 PM  Staffing Performed by: anesthesiologist   Preanesthetic Checklist Completed: patient identified, site marked, surgical consent, pre-op evaluation, timeout performed, IV checked, risks and benefits discussed and monitors and equipment checked  Epidural Patient position: sitting Prep: site prepped and draped and DuraPrep Patient monitoring: continuous pulse ox and blood pressure Approach: midline Injection technique: LOR air  Needle:  Needle type: Tuohy  Needle gauge: 17 G Needle length: 9 cm and 9 Needle insertion depth: 5 cm cm Catheter type: closed end flexible Catheter size: 19 Gauge Catheter at skin depth: 10 cm Test dose: negative  Assessment Events: blood not aspirated, injection not painful, no injection resistance, negative IV test and no paresthesia  Additional Notes Discussed risk of headache, infection, bleeding, nerve injury and failed or incomplete block.  Patient voices understanding and wishes to proceed.  Epidural placed easily on second attempt (patient anxious and screaming/verbally combative during first attempt, redirected with counseling).  No paresthesia.  No apparent complications.  Patient calm and appropriate after placement.  Jasmine December, MD Reason for block:procedure for pain

## 2013-03-26 NOTE — H&P (Signed)
Natasha Byrd  Natasha Byrd is a 27 y.o. G2P1001 with IUP at [redacted]w[redacted]d presenting for labor. Patient states she has been having  irregular contractions, no vaginal bleeding, intact membranes, with active fetal movement.   Of note, she was noted to have a breech fetal malpresentation and was scheduled for cesarean section as she declined ECV.  During her preoperative visit today, she reported having  contractions and increased pain.  She was sent to the MAU where she was evaluated and found to be in labor but bedside ultrasound showed cephalic presentation.  She was admitted for labor and unstable fetal presentation.  Prenatal Course Source of Care: [facility]  with onset of care at 20  weeks Pregnancy complications or risks: Patient Active Problem List   Diagnosis Date Noted  . Proteinuria in pregnancy, antepartum 12/19/2012  . Depression complicating pregnancy, antepartum 11/21/2012  . Supervision of normal pregnancy 11/21/2012  . Urinary tract infection, site not specified 09/30/2012  . Benzodiazepine abuse, continuous 03/30/2012  . Quit smoking 08/17/2008  . ATTENTION DEFICIT, W/HYPERACTIVITY 10/11/2006  . HEADACHE, UNSPECIFIED 10/11/2006  Insufficient prenatal care  She plans to bottle feed She desires Depo-Provera for postpartum contraception.   Prenatal labs and studies: ABO, Rh: A/POS/-- (04/10 1044) Antibody: NEG (04/10 1044) Rubella: 1.84 (04/10 1044) RPR: NON REAC (04/10 1044)  HBsAg: NEGATIVE (04/10 1044)  HIV: NON REACTIVE (04/10 1044)  GBS: Negative on 03/20/13 1 hr Glucola: Could not tolerate test; was told to return to test again but with Snickers bars but she did not show up again until 38 weeks Genetic screening: normal quad screen Anatomy US normal  Prenatal Transfer Tool  Maternal Diabetes: No Genetic Screening: Normal Maternal Ultrasounds/Referrals: Normal Fetal Ultrasounds or other Referrals:  None Maternal Substance Abuse:  Yes:  Type:  Marijuana Significant Maternal Medications:  None Significant Maternal Lab Results: Lab values include: Group B Strep negative  Past Medical History  Diagnosis Date  . Depression     Past Surgical History  Procedure Laterality Date  . Tonsillectomy      OB History  Gravida Para Term Preterm AB SAB TAB Ectopic Multiple Living  2 1 1  0 0     1    # Outcome Date GA Lbr Len/2nd Weight Sex Delivery Anes PTL Lv  2 CUR           1 TRM 08/08/08 [redacted]w[redacted]d 23:00 7 lb 13 oz (3.544 kg) M SVD EPI  Y     Comments: Initially twin pregnancy.  Twin A demise noted at 26 weeks, but was noted to be a remote demise as decomposed tissue identified.  12 week scan showed viable Twin A.  So demise occured early in the 2nd trimester. No other complications during pregnancy.      History   Social History  . Marital Status: Single    Spouse Name: N/A    Number of Children: N/A  . Years of Education: N/A   Social History Main Topics  . Smoking status: Former Games developer  . Smokeless tobacco: None  . Alcohol Use: No     Comment: Weekends  . Drug Use: No  . Sexual Activity: Not Currently    Birth Control/ Protection: None   Other Topics Concern  . None   Social History Narrative  . None    Family History  Problem Relation Age of Onset  . Abnormal EKG Mother   . Hypertension Mother   . Hypertension Brother   . Hypertension  Maternal Grandmother     Facility-administered medications prior to admission  Medication Dose Route Frequency Provider Last Rate Last Dose  . TDaP (BOOSTRIX) injection 0.5 mL  0.5 mL Intramuscular Once Tereso Newcomer, MD       Prescriptions prior to admission  Medication Sig Dispense Refill  . Prenatal Vit-Fe Fumarate-FA (PRENATAL MULTIVITAMIN) TABS tablet Take 1 tablet by mouth daily at 12 noon.        No Known Allergies  Review of Systems: Negative except for what is mentioned in HPI.  Byrd Exam: BP 123/72  Pulse 71  Temp(Src) 98 F (36.7 C)  Resp 18  Ht  5\' 6"  (1.676 m)  Wt 171 lb 9.6 oz (77.837 kg)  BMI 27.71 kg/m2  LMP 06/23/2012 GENERAL: Well-developed, well-nourished female in no acute distress.  LUNGS: Clear to auscultation bilaterally.  HEART: Regular rate and rhythm. ABDOMEN: Soft, nontender, nondistended, gravid. EXTREMITIES: Nontender, no edema, 2+ distal pulses. Cervical Exam: Dilatation 4cm   Effacement 70%   Station -2   Presentation: cephalic on bedside ultrasound FHT:  Baseline rate 130  bpm   Variability moderate  Accelerations present   Decelerations none Contractions: Every 4 -7 mins   Pertinent Labs/Studies:  Pending   Assessment : Natasha Byrd is a 27 y.o. G2P1001 at [redacted]w[redacted]d being admitted for labor, has unstable fetal presentation, currently cephalic  Plan: Labor: Expectant management.  Augmentation as needed, per protocol for unstable presentation FWB: Reassuring fetal heart tracing.  GBS negative Delivery plan: Hopeful for vaginal delivery. Will continue to monitor fetal presentation as she has now changed presentation since last prenatal visit.  Jaynie Collins, MD, FACOG Attending Obstetrician & Gynecologist Faculty Practice, [facility]

## 2013-03-26 NOTE — MAU Note (Signed)
Patient was in pre op for her appointment and told the RN she was having pain and was sent to MAU for evaluation. Patient is scheduled for a primary cesarean section on 8-15. States she was having contractions last night but today she is having vaginal pain.

## 2013-03-26 NOTE — Progress Notes (Signed)
Natasha Byrd is a 27 y.o. G2P1001 at [redacted]w[redacted]d   Subjective: Comfortable with epidural  Objective: BP 102/49  Pulse 65  Temp(Src) 98.1 F (36.7 C) (Oral)  Resp 18  Ht 5\' 6"  (1.676 m)  Wt 77.837 kg (171 lb 9.6 oz)  BMI 27.71 kg/m2  SpO2 100%  LMP 06/23/2012      FHT:  FHR: 130s bpm, variability: moderate,  accelerations:  Present,  decelerations:  Absent UC:   irregular, every 5-8 minutes with Pitocin SVE:   Dilation: 4 Effacement (%): 70 Station: -2 Exam by:: k. shaw, cnm  Labs: Lab Results  Component Value Date   WBC 5.0 03/26/2013   HGB 12.1 03/26/2013   HCT 35.8* 03/26/2013   MCV 94.7 03/26/2013   PLT 172 03/26/2013    Assessment / Plan: IOL process  Continue to increase Pitocin to achieve active labor  Cam Hai 03/26/2013, 9:25 PM

## 2013-03-27 ENCOUNTER — Encounter: Payer: Self-pay | Admitting: Obstetrics & Gynecology

## 2013-03-27 ENCOUNTER — Encounter (HOSPITAL_COMMUNITY): Payer: Self-pay

## 2013-03-27 ENCOUNTER — Encounter: Payer: Self-pay | Admitting: Advanced Practice Midwife

## 2013-03-27 DIAGNOSIS — O321XX0 Maternal care for breech presentation, not applicable or unspecified: Secondary | ICD-10-CM

## 2013-03-27 MED ORDER — IBUPROFEN 600 MG PO TABS
600.0000 mg | ORAL_TABLET | Freq: Four times a day (QID) | ORAL | Status: DC
  Administered 2013-03-27 – 2013-03-29 (×10): 600 mg via ORAL
  Filled 2013-03-27 (×10): qty 1

## 2013-03-27 MED ORDER — OXYCODONE-ACETAMINOPHEN 5-325 MG PO TABS
1.0000 | ORAL_TABLET | ORAL | Status: DC | PRN
  Administered 2013-03-28 (×3): 1 via ORAL
  Administered 2013-03-29: 2 via ORAL
  Filled 2013-03-27: qty 2
  Filled 2013-03-27 (×3): qty 1

## 2013-03-27 MED ORDER — TETANUS-DIPHTH-ACELL PERTUSSIS 5-2.5-18.5 LF-MCG/0.5 IM SUSP: 0.5000 mL | Freq: Once | INTRAMUSCULAR | Status: DC

## 2013-03-27 MED ORDER — ONDANSETRON HCL 4 MG/2ML IJ SOLN: 4.0000 mg | INTRAMUSCULAR | Status: DC | PRN

## 2013-03-27 MED ORDER — DIPHENHYDRAMINE HCL 25 MG PO CAPS: 25.0000 mg | ORAL_CAPSULE | Freq: Four times a day (QID) | ORAL | Status: DC | PRN

## 2013-03-27 MED ORDER — SENNOSIDES-DOCUSATE SODIUM 8.6-50 MG PO TABS
2.0000 | ORAL_TABLET | Freq: Every day | ORAL | Status: DC
  Administered 2013-03-27: 2 via ORAL

## 2013-03-27 MED ORDER — ONDANSETRON HCL 4 MG PO TABS: 4.0000 mg | ORAL_TABLET | ORAL | Status: DC | PRN

## 2013-03-27 MED ORDER — PRENATAL MULTIVITAMIN CH
1.0000 | ORAL_TABLET | Freq: Every day | ORAL | Status: DC
  Administered 2013-03-27 – 2013-03-29 (×3): 1 via ORAL
  Filled 2013-03-27 (×3): qty 1

## 2013-03-27 MED ORDER — BENZOCAINE-MENTHOL 20-0.5 % EX AERO
1.0000 "application " | INHALATION_SPRAY | CUTANEOUS | Status: DC | PRN
  Administered 2013-03-27: 1 via TOPICAL
  Filled 2013-03-27: qty 56

## 2013-03-27 MED ORDER — ZOLPIDEM TARTRATE 5 MG PO TABS: 5.0000 mg | ORAL_TABLET | Freq: Every evening | ORAL | Status: DC | PRN

## 2013-03-27 MED ORDER — LANOLIN HYDROUS EX OINT: TOPICAL_OINTMENT | CUTANEOUS | Status: DC | PRN

## 2013-03-27 MED ORDER — DIBUCAINE 1 % RE OINT: 1.0000 "application " | TOPICAL_OINTMENT | RECTAL | Status: DC | PRN

## 2013-03-27 MED ORDER — WITCH HAZEL-GLYCERIN EX PADS: 1.0000 "application " | MEDICATED_PAD | CUTANEOUS | Status: DC | PRN

## 2013-03-27 MED ORDER — SIMETHICONE 80 MG PO CHEW: 80.0000 mg | CHEWABLE_TABLET | ORAL | Status: DC | PRN

## 2013-03-27 NOTE — Lactation Note (Signed)
This note was copied from the chart of Natasha Reita May. Lactation Consultation Note:Initial visit with mom. She reports that baby has been nursing well. Has just finished feeding and is asleep in visitors arms. Reports that it hurts for the first few minutes but then eases off. Reviewed wide open mouth and keeping the baby close to the breast throughout the feeding. Encouraged to watch for feeding cues and encouraged to feed whenever she sees them. No questions at present. To call prn. BF brochure left with mom with resources for support after DC.  Patient Name: Natasha Byrd [id number] Date: 03/27/2013 Reason for consult: Initial assessment   Maternal Data Formula Feeding for Exclusion: No Infant to breast within first hour of birth: Yes Does the patient have breastfeeding experience prior to this delivery?: Yes  Feeding Feeding Type: Breast Milk  LATCH Score/Interventions Latch: Grasps breast easily, tongue down, lips flanged, rhythmical sucking.  Audible Swallowing: A few with stimulation Intervention(s): Skin to skin  Type of Nipple: Everted at rest and after stimulation  Comfort (Breast/Nipple): Soft / non-tender     Hold (Positioning): No assistance needed to correctly position infant at breast.  LATCH Score: 9  Lactation Tools Discussed/Used     Consult Status Consult Status: PRN    Pamelia Hoit 03/27/2013, 1:17 PM

## 2013-03-27 NOTE — Anesthesia Postprocedure Evaluation (Signed)
  Anesthesia Post-op Note  Patient: Natasha Byrd  Procedure(s) Performed: * No procedures listed *  Patient Location: PACU and Mother/Baby  Anesthesia Type:Epidural  Level of Consciousness: awake, alert  and oriented  Airway and Oxygen Therapy: Patient Spontanous Breathing  Post-op Pain: none  Post-op Assessment: Post-op Vital signs reviewed, Patient's Cardiovascular Status Stable, No headache, No backache, No residual numbness and No residual motor weakness  Post-op Vital Signs: Reviewed and stable  Complications: No apparent anesthesia complications

## 2013-03-27 NOTE — Clinical Social Work Maternal (Signed)
Clinical Social Work Department PSYCHOSOCIAL ASSESSMENT - MATERNAL/CHILD 03/27/2013  Patient:  Natasha Byrd  Account Number:  [id number]  Admit Date:  03/26/2013  Marjo Bicker Name:   Natasha Byrd or Natasha Byrd)    Clinical Social Worker:  Nobie Putnam, LCSW   Date/Time:  03/27/2013 03:36 PM  Date Referred:  03/27/2013   Referral source  CN     Referred reason  Depression/Anxiety  Substance Abuse   Other referral source:    I:  FAMILY / HOME ENVIRONMENT Child's legal guardian:  PARENT  Guardian - Name Guardian - Age Guardian - Address  Natasha Byrd 27 [address] [place], [place] [address]  Natasha Byrd 27    Other household support members/support persons Name Relationship DOB  Natasha Byrd AUNT    Other support:    II  PSYCHOSOCIAL DATA Information Source:  Patient Interview  Event organiser Employment:   Surveyor, quantity resources:  [employer] If Medicaid - County:  [place] Other  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   School / Grade:   Maternity Care Coordinator / Child Services Coordination / Early Interventions:  Cultural issues impacting care:    III  STRENGTHS Strengths  Adequate Resources  Home prepared for Child (including basic supplies)  Supportive family/friends   Strength comment:    IV  RISK FACTORS AND CURRENT PROBLEMS Current Problem:  YES   Risk Factor & Current Problem Patient Issue Family Issue Risk Factor / Current Problem Comment  Mental Illness Y N Hx of depression/bipolar & SI  Substance Abuse Y N Hx of MJ  Housing Concerns Y N Unstable housing  DSS Involvement Y N Loss custody of other child    V  SOCIAL WORK ASSESSMENT CSW met with pt to assess history of depression & bipolar disorder, MPJ use & current housing situation.  Pt denies being diagnosed with bipolar disorder but does acknowledge history of depression.  Pt did not disclose when she was diagnosed with depression, as she questioned this CSW reasoning for questions/consult.  She  did tell CSW that she started counseling sessions, 1 month ago with Natasha Byrd at [facility] the [place].  She denies any current depression symptoms now.  SI noted in '13 however pt would not elaborate.  Pt admits to smoking MJ "every other day" prior to pregnancy confirmation at 4 months.  Once pregnancy was confirmed, she stopped smoking MJ at that time.  CSW noted positive MJ screen in April '14.  She denies other illegal substance use & verbalized understanding of hospital drug testing policy.  UDS & meconium results are pending.  Pt is confident that results will be negative.  CSW inquired about pt's current living arrangements, as she has a history of unstable housing.  Pt told CSW that she is currently living with her aunt, Natasha Byrd & her twin boys.  Pt has another child, Natasha Byrd (DOB 08/08/08), who lives with the FOB's family.  According to pt, CPS was involved 2 1/2 years ago, because she did not have stable housing & removed from her care.  She denies current CPS involvement.  She told CSW that she chose to leave her son with his family.  Pt has all the necessary supplies for the infant.  Since pt does not have custody of her son, CSW made a CPS.  Pt does not think CPS staff will have a concern about her caring for this child. CPS worker, Natasha Byrd was assigned the case & came to meet with  pt this afternoon.  A Team Decision Meeting, TDM will be held tomorrow at 10am. CSW will continue to monitor drug screen results & facilitate safe discharge of infant.      VI SOCIAL WORK PLAN Social Work Plan  No Further Intervention Required / No Barriers to Discharge   Type of pt/family education:   If child protective services report - county:   If child protective services report - date:   Information/referral to community resources comment:   Other social work plan:

## 2013-03-27 NOTE — Progress Notes (Signed)
UR chart review completed.  

## 2013-03-28 ENCOUNTER — Inpatient Hospital Stay (HOSPITAL_COMMUNITY)
Admission: RE | Admit: 2013-03-28 | Payer: Medicaid Other | Source: Ambulatory Visit | Admitting: Obstetrics & Gynecology

## 2013-03-28 SURGERY — Surgical Case
Anesthesia: Regional | Site: Abdomen

## 2013-03-28 NOTE — Discharge Summary (Cosign Needed)
Obstetric Discharge Summary Reason for Admission: onset of labor and unstable fetal presentation Prenatal Procedures: none Intrapartum Procedures: spontaneous vaginal delivery Postpartum Procedures: none Complications-Operative and Postpartum: 1st degree labial lac Hemoglobin  Date Value Range Status  03/26/2013 12.1  12.0 - 15.0 g/dL Final     HCT  Date Value Range Status  03/26/2013 35.8* 36.0 - 46.0 % Final    Physical Exam:  General: alert, cooperative and no distress Lochia: appropriate Uterine Fundus: firm DVT Evaluation: No evidence of DVT seen on physical exam. Negative Homan's sign. No cords or calf tenderness. No significant calf/ankle edema.  Discharge Diagnoses: Term Pregnancy-delivered  Discharge Information: Date: 03/28/2013 Activity: pelvic rest Diet: routine Medications: Ibuprofen Condition: stable Instructions: refer to practice specific booklet Discharge to: home   Newborn Data: Live born female  Birth Weight: 6 lb 8.8 oz (2970 g) APGAR: 9, 9  Home with aunt.  Breast and bottle feeding.  Depo for birth control.  Garnette Czech 03/28/2013, 10:01 AM  Pt was not discharged yesterday. Remainder of info is consistent and pt was reevaluated on day of discharge. I spoke with and examined patient and agree with PA-S's note and plan of care.  Tawana Scale, MD Ob Fellow 03/29/2013 9:21 AM

## 2013-03-28 NOTE — Progress Notes (Signed)
Post Partum Day 1 Subjective: no complaints, up ad lib, voiding, tolerating PO and + flatus  Objective: Blood pressure 105/64, pulse 69, temperature 98.3 F (36.8 C), temperature source Oral, resp. rate 16, height 5\' 6"  (1.676 m), weight 77.837 kg (171 lb 9.6 oz), last menstrual period 06/23/2012, SpO2 100.00%, unknown if currently breastfeeding.  Physical Exam:  General: alert, cooperative, appears stated age and no distress Lochia: appropriate Uterine Fundus: Firm @Umbi  Incision: NA DVT Evaluation: No evidence of DVT seen on physical exam. Negative Homan's sign. No cords or calf tenderness. No significant calf/ankle edema.   Recent Labs  03/26/13 1315  HGB 12.1  HCT 35.8*    Assessment/Plan: Plan for discharge tomorrow, Infant going to foster care, moc would like to stay with infant for additional day and discharge tomorrow. Meeting milestones otherwise. Continue current PP care. Depo for MOC.   LOS: 2 days   Natasha Byrd RYAN 03/28/2013, 4:52 PM

## 2013-03-29 MED ORDER — IBUPROFEN 600 MG PO TABS: 600.0000 mg | ORAL_TABLET | Freq: Four times a day (QID) | ORAL | Status: DC

## 2013-04-02 ENCOUNTER — Ambulatory Visit: Payer: Self-pay

## 2013-04-24 ENCOUNTER — Ambulatory Visit: Payer: Self-pay | Admitting: Obstetrics & Gynecology

## 2013-05-29 ENCOUNTER — Ambulatory Visit: Payer: Self-pay | Admitting: Obstetrics & Gynecology

## 2013-08-18 ENCOUNTER — Encounter (HOSPITAL_COMMUNITY): Payer: Self-pay | Admitting: Emergency Medicine

## 2013-08-18 ENCOUNTER — Emergency Department (HOSPITAL_COMMUNITY)
Admission: EM | Admit: 2013-08-18 | Discharge: 2013-08-18 | Discharge status: Against Medical Advice | Payer: Medicaid Other | Attending: Emergency Medicine | Admitting: Emergency Medicine

## 2013-08-18 DIAGNOSIS — N938 Other specified abnormal uterine and vaginal bleeding: Secondary | ICD-10-CM | POA: Insufficient documentation

## 2013-08-18 DIAGNOSIS — N949 Unspecified condition associated with female genital organs and menstrual cycle: Secondary | ICD-10-CM | POA: Insufficient documentation

## 2013-08-18 DIAGNOSIS — N925 Other specified irregular menstruation: Secondary | ICD-10-CM | POA: Insufficient documentation

## 2013-08-18 LAB — POCT I-STAT, CHEM 8
BUN: 15 mg/dL (ref 6–23)
CALCIUM ION: 1.26 mmol/L — AB (ref 1.12–1.23)
CREATININE: 1.1 mg/dL (ref 0.50–1.10)
Chloride: 103 mEq/L (ref 96–112)
Glucose, Bld: 82 mg/dL (ref 70–99)
HCT: 42 % (ref 36.0–46.0)
HEMOGLOBIN: 14.3 g/dL (ref 12.0–15.0)
Potassium: 3.5 mEq/L — ABNORMAL LOW (ref 3.7–5.3)
Sodium: 142 mEq/L (ref 137–147)
TCO2: 23 mmol/L (ref 0–100)

## 2013-08-18 LAB — URINALYSIS, ROUTINE W REFLEX MICROSCOPIC
GLUCOSE, UA: NEGATIVE mg/dL
KETONES UR: 40 mg/dL — AB
Nitrite: NEGATIVE
PROTEIN: 100 mg/dL — AB
Specific Gravity, Urine: 1.027 (ref 1.005–1.030)
Urobilinogen, UA: 1 mg/dL (ref 0.0–1.0)
pH: 5.5 (ref 5.0–8.0)

## 2013-08-18 LAB — URINE MICROSCOPIC-ADD ON

## 2013-08-18 LAB — POCT PREGNANCY, URINE: Preg Test, Ur: NEGATIVE

## 2013-08-18 NOTE — ED Notes (Signed)
Patient has come to nurse 1st several times asking why she has not gone back yet.   Explained to patient about acuity and that patient would have to wait until a room was available.    Patient asked to speak with charge.   I did advise charge.   Charge, RN busy at this time, will come to waiting room when available.

## 2013-08-18 NOTE — ED Notes (Signed)
Called for the pt with no answer

## 2013-08-18 NOTE — ED Notes (Signed)
Pt c/o vaginal bleeding with clots; pt sts 4 months post partum; pt thinks she may have been pregnant again but sts did not take a test

## 2013-08-18 NOTE — ED Notes (Signed)
Called for pt with no answer x 2 

## 2013-08-18 NOTE — ED Notes (Signed)
Unable to locate teh pt x 3

## 2014-01-01 ENCOUNTER — Emergency Department (INDEPENDENT_AMBULATORY_CARE_PROVIDER_SITE_OTHER)
Admission: EM | Admit: 2014-01-01 | Discharge: 2014-01-01 | Disposition: A | Discharge status: Home / Self Care | Payer: Self-pay | Source: Home / Self Care | Attending: Emergency Medicine | Admitting: Emergency Medicine

## 2014-01-01 ENCOUNTER — Encounter (HOSPITAL_COMMUNITY): Payer: Self-pay | Admitting: Emergency Medicine

## 2014-01-01 DIAGNOSIS — A499 Bacterial infection, unspecified: Secondary | ICD-10-CM

## 2014-01-01 DIAGNOSIS — B9689 Other specified bacterial agents as the cause of diseases classified elsewhere: Secondary | ICD-10-CM

## 2014-01-01 DIAGNOSIS — N760 Acute vaginitis: Secondary | ICD-10-CM

## 2014-01-01 MED ORDER — METRONIDAZOLE 500 MG PO TABS: 500.0000 mg | ORAL_TABLET | Freq: Two times a day (BID) | ORAL | Status: DC

## 2014-01-01 NOTE — ED Notes (Signed)
C/o vaginal odor onset 4 days ago.  Had BV in the past but could not afford the medication.  No itching or discharge.

## 2014-01-01 NOTE — Discharge Instructions (Signed)
To restore the normal balance of "good bacteria" in your system.  Take a probiotic once daily.  These can be gotten over the counter at the drug store without a prescription and come under various brand names such as Culturelle, Align, Florastore, and Phillips.  The best thing to do is to ask your pharmacist to recommend a good probiotic that is not too expensive.  ° ° °Bacterial Vaginosis °Bacterial vaginosis is a vaginal infection that occurs when the normal balance of bacteria in the vagina is disrupted. It results from an overgrowth of certain bacteria. This is the most common vaginal infection in women of childbearing age. Treatment is important to prevent complications, especially in pregnant women, as it can cause a premature delivery. °CAUSES  °Bacterial vaginosis is caused by an increase in harmful bacteria that are normally present in smaller amounts in the vagina. Several different kinds of bacteria can cause bacterial vaginosis. However, the reason that the condition develops is not fully understood. °RISK FACTORS °Certain activities or behaviors can put you at an increased risk of developing bacterial vaginosis, including: °· Having a new sex partner or multiple sex partners. °· Douching. °· Using an intrauterine device (IUD) for contraception. °Women do not get bacterial vaginosis from toilet seats, bedding, swimming pools, or contact with objects around them. °SIGNS AND SYMPTOMS  °Some women with bacterial vaginosis have no signs or symptoms. Common symptoms include: °· Grey vaginal discharge. °· A fishlike odor with discharge, especially after sexual intercourse. °· Itching or burning of the vagina and vulva. °· Burning or pain with urination. °DIAGNOSIS  °Your health care provider will take a medical history and examine the vagina for signs of bacterial vaginosis. A sample of vaginal fluid may be taken. Your health care provider will look at this sample under a microscope to check for bacteria and  abnormal cells. A vaginal pH test may also be done.  °TREATMENT  °Bacterial vaginosis may be treated with antibiotic medicines. These may be given in the form of a pill or a vaginal cream. A second round of antibiotics may be prescribed if the condition comes back after treatment.  °HOME CARE INSTRUCTIONS  °· Only take over-the-counter or prescription medicines as directed by your health care provider. °· If antibiotic medicine was prescribed, take it as directed. Make sure you finish it even if you start to feel better. °· Do not have sex until treatment is completed. °· Tell all sexual partners that you have a vaginal infection. They should see their health care provider and be treated if they have problems, such as a mild rash or itching. °· Practice safe sex by using condoms and only having one sex partner. °SEEK MEDICAL CARE IF:  °· Your symptoms are not improving after 3 days of treatment. °· You have increased discharge or pain. °· You have a fever. °MAKE SURE YOU:  °· Understand these instructions. °· Will watch your condition. °· Will get help right away if you are not doing well or get worse. °FOR MORE INFORMATION  °Centers for Disease Control and Prevention, Division of STD Prevention: www.cdc.gov/std °American Sexual Health Association (ASHA): www.ashastd.org  °Document Released: 07/31/2005 Document Revised: 05/21/2013 Document Reviewed: 03/12/2013 °ExitCare® Patient Information ©2014 [employer]. ° °

## 2014-01-01 NOTE — ED Provider Notes (Signed)
  Chief Complaint   Chief Complaint  Patient presents with  . Vaginitis    History of Present Illness   Sheliah PlaneShantel L Jennette Kettleeal is a 28 year old female who presents tonight with a four-day history of vaginal discharge and odor. She denies any itching or vulvar, vaginal, or pelvic pain. She has had no lower back pain. She denies any urinary symptoms. She has had no fever, chills, nausea, or vomiting. She has a history of bacterial vaginosis in the past, about 9 or 10 months ago. She was given a prescription for this but never did take it she was pregnant at the time. Since then she has delivered. She is not breast-feeding. She would like to have the medication prescribed again. She does not want to have a pelvic exam or any further workup today.  Review of Systems   Other than as noted above, the patient denies any of the following symptoms: Systemic:  No fever or chills GI:  No abdominal pain, nausea, vomiting, diarrhea, constipation, melena or hematochezia. GU:  No dysuria, frequency, urgency, hematuria, vaginal discharge, itching, or abnormal vaginal bleeding.  PMFSH   Past medical history, family history, social history, meds, and allergies were reviewed.    Physical Examination    Vital signs:  BP 133/87  Pulse 79  Temp(Src) 98.7 F (37.1 C) (Oral)  Resp 16  SpO2 99%  LMP 12/30/2013  Breastfeeding? No General:  Alert, oriented and in no distress. Lungs:  Breath sounds clear and equal bilaterally.  No wheezes, rales or rhonchi. Heart:  Regular rhythm.  No gallops or murmers. Abdomen:  Soft, flat and non-distended.  No organomegaly or mass.  No tenderness, guarding or rebound.  Bowel sounds normally active. Skin:  Clear, warm and dry.  Labs   Urine pregnancy test was negative.   Assessment   The encounter diagnosis was Bacterial vaginal infection.       Plan    1.  Meds:  The following meds were prescribed:   Discharge Medication List as of 01/01/2014  9:09 PM    START  taking these medications   Details  metroNIDAZOLE (FLAGYL) 500 MG tablet Take 1 tablet (500 mg total) by mouth 2 (two) times daily., Starting 01/01/2014, Until Discontinued, Normal        2.  Patient Education/Counseling:  The patient was given appropriate handouts, self care instructions, and instructed in symptomatic relief.  Suggest a probiotic for prevention.  3.  Follow up:  The patient was told to follow up here if no better in 3 to 4 days, or sooner if becoming worse in any way, and given some red flag symptoms such as worsening pain, fever, persistent vomiting, or heavy vaginal bleeding which would prompt immediate return.       Reuben Likesavid C Felecia Stanfill, MD 01/01/14 2127

## 2014-01-02 LAB — POCT PREGNANCY, URINE: PREG TEST UR: NEGATIVE

## 2014-04-09 IMAGING — US US OB DETAIL+14 WK
1 series · 12 of 28 positions shown · non-contrast
Comparison: none

[Series 1: us ob detail +14 wk · 87 acquisitions, 12 frames shown]
[im 4/87]
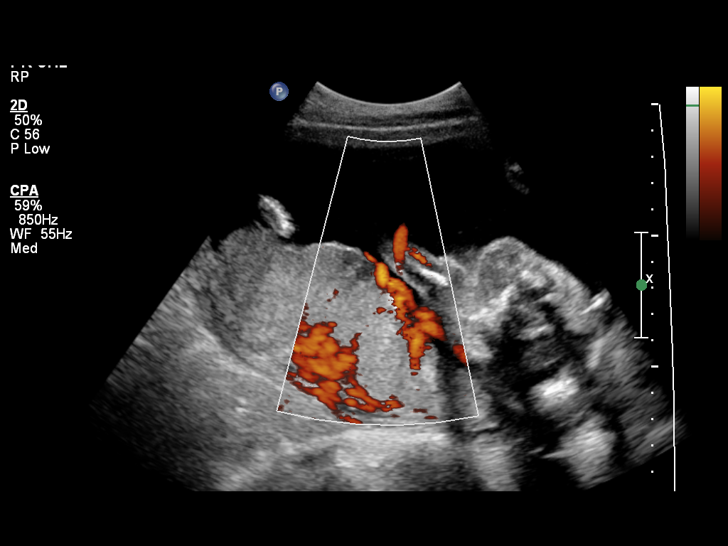
[im 10/87]
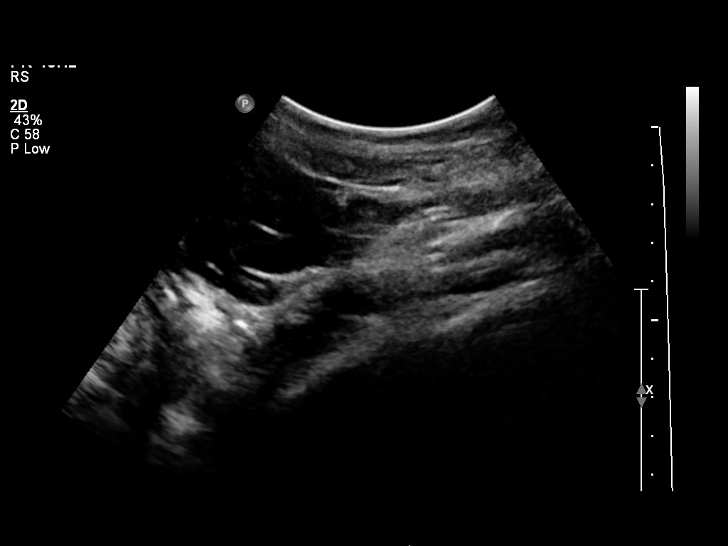
[im 16/87]
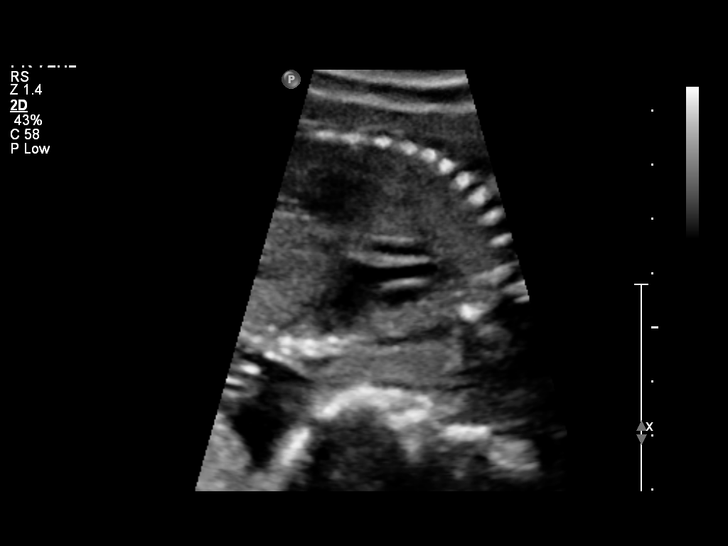
[im 26/87]
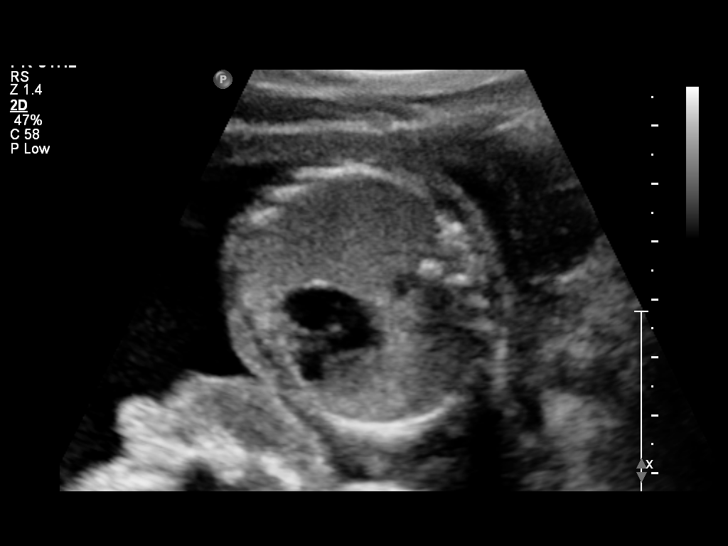
[im 32/87]
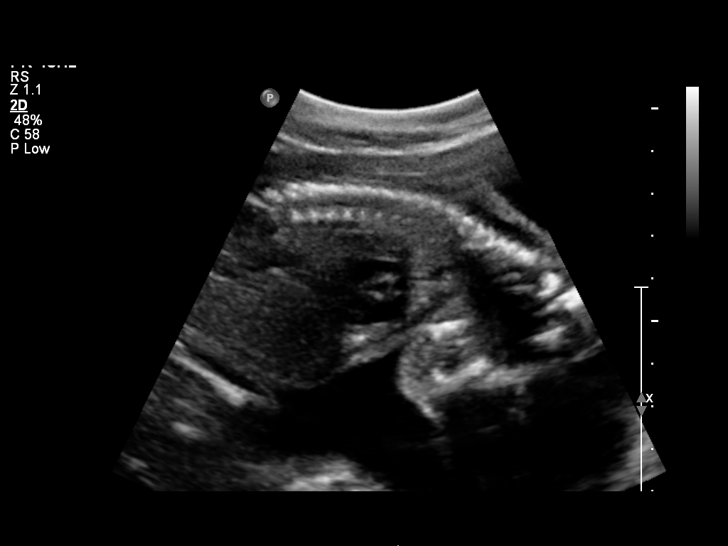
[im 39/87]
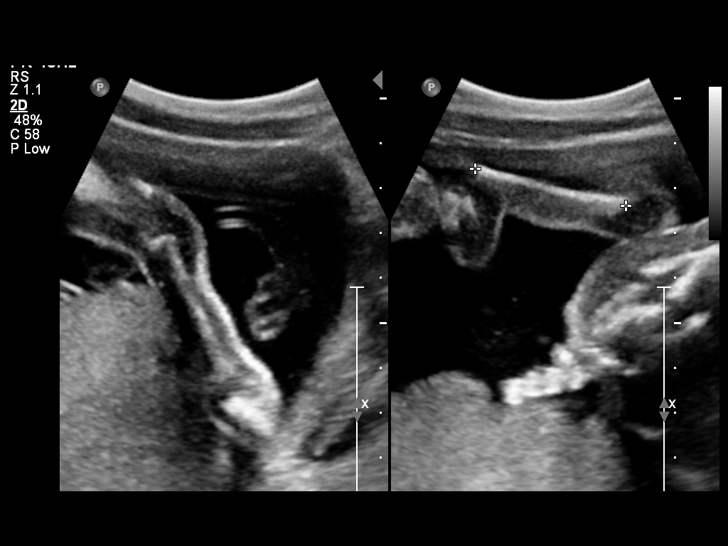
[im 48/87]
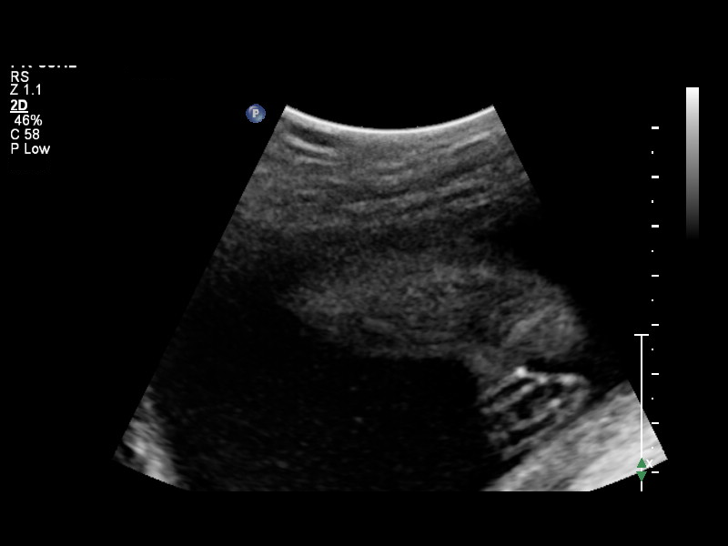
[im 55/87]
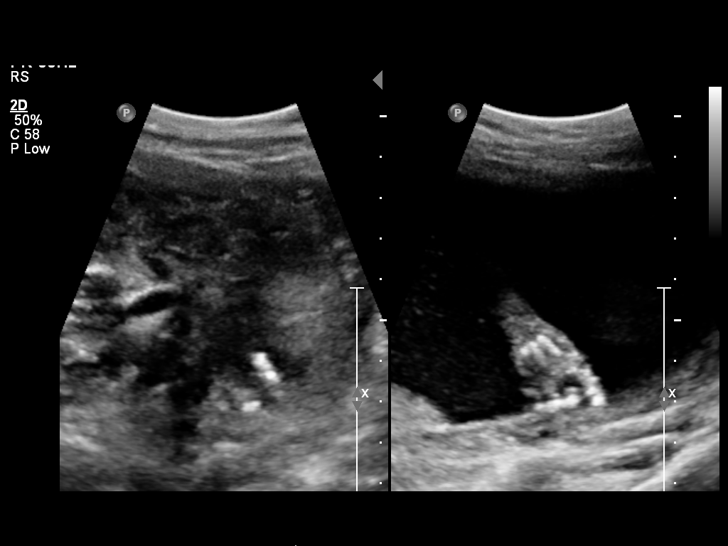
[im 61/87]
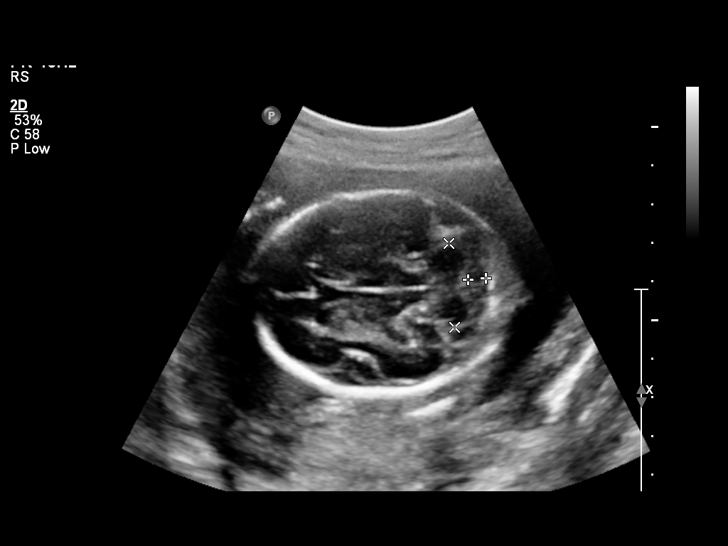
[im 71/87]
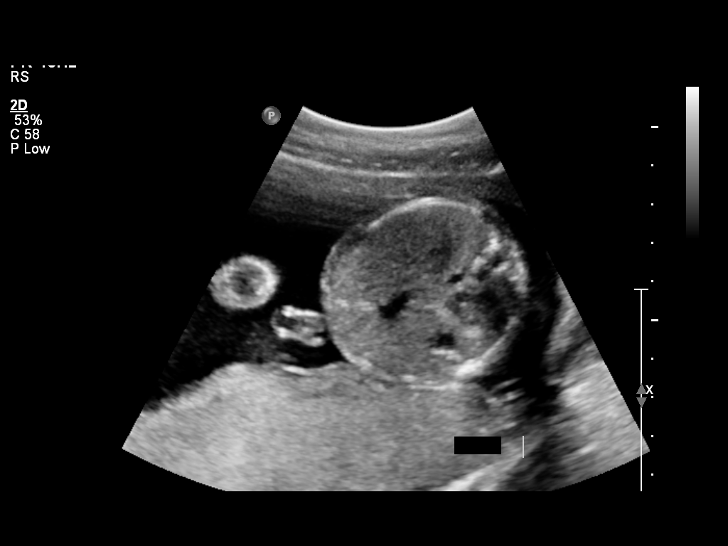
[im 77/87]
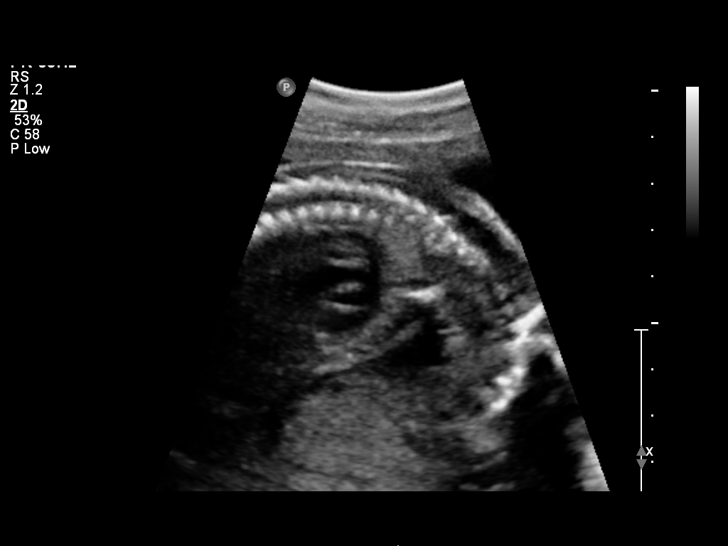
[im 83/87]
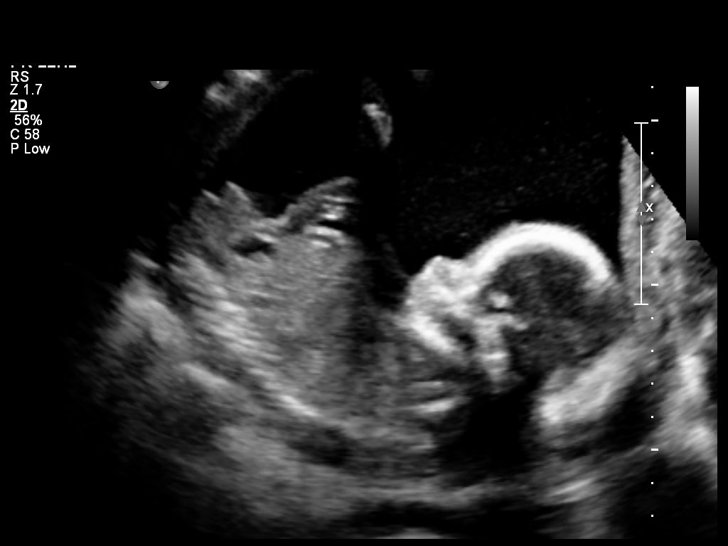

[12 of 28 positions shown; findings below may reference images not displayed]

OBSTETRICS REPORT
                      (Signed Final 11/25/2012 [DATE])

Service(s) Provided

 US OB DETAIL + 14 WK                                  76811.0
Indications

 Detailed fetal anatomic survey
 Poor obstetric history: Previous gestational HTN
Fetal Evaluation

 Num Of Fetuses:    1
 Fetal Heart Rate:  161                         bpm
 Cardiac Activity:  Observed
 Presentation:      Cephalic
 Placenta:          Posterior, above cervical
                    os
 P. Cord            Visualized, central
 Insertion:

 Amniotic Fluid
 AFI FV:      Subjectively within normal limits
                                             Larg Pckt:     6.5  cm
Biometry

 BPD:     51.1  mm    G. Age:   21w 4d                CI:        68.35   70 - 86
                                                      FL/HC:      18.6   15.9 -

 HC:     197.6  mm    G. Age:   22w 0d       62  %    HC/AC:      1.20   1.06 -

 AC:     164.8  mm    G. Age:   21w 4d       46  %    FL/BPD:
 FL:      36.8  mm    G. Age:   21w 5d       50  %    FL/AC:      22.3   20 - 24
 HUM:       34  mm    G. Age:   21w 4d       52  %
 CER:     21.5  mm    G. Age:   20w 4d       25  %

 Est. FW:     439  gm    0 lb 15 oz      50  %
Gestational Age

 LMP:           21w 3d       Date:   06/28/12                 EDD:   04/04/13
 U/S Today:     21w 5d                                        EDD:   04/02/13
 Best:          21w 3d    Det. By:   LMP  (06/28/12)          EDD:   04/04/13
Anatomy
 Cranium:          Appears normal         Aortic Arch:      Appears normal
 Fetal Cavum:      Appears normal         Ductal Arch:      Appears normal
 Ventricles:       Appears normal         Diaphragm:        Appears normal
 Choroid Plexus:   Appears normal         Stomach:          Appears normal
 Cerebellum:       Appears normal         Abdomen:          Appears normal
 Posterior Fossa:  Appears normal         Abdominal Wall:   Appears nml (cord
                                                            insert, abd wall)
 Nuchal Fold:      Appears normal         Cord Vessels:     Appears normal (3
                                                            vessel cord)
 Face:             Appears normal         Kidneys:          Appear normal
                   (orbits and profile)
 Lips:             Appears normal         Bladder:          Appears normal
 Heart:            Appears normal         Spine:            Appears normal
                   (4CH, axis, and
                   situs)
 RVOT:             Appears normal         Lower             Appears normal
                                          Extremities:
 LVOT:             Appears normal         Upper             Appears normal
                                          Extremities:

 Other:  Heels and 5th digit visualized. Nasal bone visualized. Fetus appears
         to be a female.
Targeted Anatomy

 Fetal Central Nervous System
 Cisterna Magna:
Cervix Uterus Adnexa

 Cervical Length:   3.25      cm

 Cervix:       Normal appearance by transabdominal scan.
 Uterus:       No abnormality visualized.
 Cul De Sac:   No free fluid seen.
 Left Ovary:   Not visualized.
 Right Ovary:  Within normal limits.

 Adnexa:     No abnormality visualized.
Impression

 Single live IUP in cephalic presentation.  Concordant
 measurements/assigned GA by LMP.
 No anatomic abnormality seen with a good quality survey
 possible.

 questions or concerns.

## 2014-06-15 ENCOUNTER — Encounter (HOSPITAL_COMMUNITY): Payer: Self-pay | Admitting: Emergency Medicine

## 2014-07-17 ENCOUNTER — Emergency Department (HOSPITAL_COMMUNITY)
Admission: EM | Admit: 2014-07-17 | Discharge: 2014-07-17 | Discharge status: Against Medical Advice | Payer: Self-pay | Attending: Emergency Medicine | Admitting: Emergency Medicine

## 2014-07-17 ENCOUNTER — Encounter (HOSPITAL_COMMUNITY): Payer: Self-pay | Admitting: Family Medicine

## 2014-07-17 DIAGNOSIS — Z3A00 Weeks of gestation of pregnancy not specified: Secondary | ICD-10-CM | POA: Insufficient documentation

## 2014-07-17 DIAGNOSIS — O99330 Smoking (tobacco) complicating pregnancy, unspecified trimester: Secondary | ICD-10-CM | POA: Insufficient documentation

## 2014-07-17 DIAGNOSIS — Z3490 Encounter for supervision of normal pregnancy, unspecified, unspecified trimester: Secondary | ICD-10-CM

## 2014-07-17 DIAGNOSIS — O210 Mild hyperemesis gravidarum: Secondary | ICD-10-CM | POA: Insufficient documentation

## 2014-07-17 DIAGNOSIS — F17210 Nicotine dependence, cigarettes, uncomplicated: Secondary | ICD-10-CM | POA: Insufficient documentation

## 2014-07-17 LAB — URINALYSIS, ROUTINE W REFLEX MICROSCOPIC
Bilirubin Urine: NEGATIVE
Glucose, UA: NEGATIVE mg/dL
Hgb urine dipstick: NEGATIVE
Ketones, ur: NEGATIVE mg/dL
Leukocytes, UA: NEGATIVE
NITRITE: NEGATIVE
Protein, ur: NEGATIVE mg/dL
Specific Gravity, Urine: 1.021 (ref 1.005–1.030)
Urobilinogen, UA: 1 mg/dL (ref 0.0–1.0)
pH: 7.5 (ref 5.0–8.0)

## 2014-07-17 LAB — POC URINE PREG, ED: Preg Test, Ur: NEGATIVE

## 2014-07-17 NOTE — ED Notes (Signed)
Pt spoke with this RN and was requesting to leave. Pt was told by this RN that he would get the MD. When talking to the MD the pt left. NAD noted. Pt ambulatory.

## 2014-07-17 NOTE — ED Notes (Signed)
Pt told EMT that she does not want anything discussed in front of boyfriend. Boyfriend asked to wait in waiting room.

## 2014-07-17 NOTE — ED Notes (Signed)
Pt sts here for hot flashes, N,V and irregular period. Denies pain

## 2014-07-17 NOTE — ED Provider Notes (Signed)
Patient left prior to evaluation by EDP.  I did not see or evaluate patient.  Garlon HatchetLisa M Sanders, PA-C 07/17/14 1344  Doug SouSam Jacubowitz, MD 07/17/14 [phone]

## 2014-10-21 ENCOUNTER — Encounter (HOSPITAL_COMMUNITY): Payer: Self-pay | Admitting: Emergency Medicine

## 2014-10-21 ENCOUNTER — Emergency Department (HOSPITAL_COMMUNITY)
Admission: EM | Admit: 2014-10-21 | Discharge: 2014-10-21 | Disposition: A | Discharge status: Home / Self Care | Payer: Self-pay | Attending: Emergency Medicine | Admitting: Emergency Medicine

## 2014-10-21 DIAGNOSIS — Z3202 Encounter for pregnancy test, result negative: Secondary | ICD-10-CM | POA: Insufficient documentation

## 2014-10-21 DIAGNOSIS — F1490 Cocaine use, unspecified, uncomplicated: Secondary | ICD-10-CM | POA: Insufficient documentation

## 2014-10-21 DIAGNOSIS — Z79899 Other long term (current) drug therapy: Secondary | ICD-10-CM | POA: Insufficient documentation

## 2014-10-21 DIAGNOSIS — R000 Tachycardia, unspecified: Secondary | ICD-10-CM | POA: Insufficient documentation

## 2014-10-21 DIAGNOSIS — R112 Nausea with vomiting, unspecified: Secondary | ICD-10-CM | POA: Insufficient documentation

## 2014-10-21 DIAGNOSIS — F1290 Cannabis use, unspecified, uncomplicated: Secondary | ICD-10-CM | POA: Insufficient documentation

## 2014-10-21 DIAGNOSIS — Z720 Tobacco use: Secondary | ICD-10-CM | POA: Insufficient documentation

## 2014-10-21 DIAGNOSIS — Z792 Long term (current) use of antibiotics: Secondary | ICD-10-CM | POA: Insufficient documentation

## 2014-10-21 DIAGNOSIS — Z87440 Personal history of urinary (tract) infections: Secondary | ICD-10-CM | POA: Insufficient documentation

## 2014-10-21 LAB — CBC WITH DIFFERENTIAL/PLATELET
BASOS ABS: 0 10*3/uL (ref 0.0–0.1)
Basophils Relative: 1 % (ref 0–1)
EOS ABS: 0.2 10*3/uL (ref 0.0–0.7)
Eosinophils Relative: 4 % (ref 0–5)
HCT: 41.2 % (ref 36.0–46.0)
HEMOGLOBIN: 14.2 g/dL (ref 12.0–15.0)
LYMPHS ABS: 2.1 10*3/uL (ref 0.7–4.0)
Lymphocytes Relative: 50 % — ABNORMAL HIGH (ref 12–46)
MCH: 32.5 pg (ref 26.0–34.0)
MCHC: 34.5 g/dL (ref 30.0–36.0)
MCV: 94.3 fL (ref 78.0–100.0)
Monocytes Absolute: 0.6 10*3/uL (ref 0.1–1.0)
Monocytes Relative: 14 % — ABNORMAL HIGH (ref 3–12)
Neutro Abs: 1.4 10*3/uL — ABNORMAL LOW (ref 1.7–7.7)
Neutrophils Relative %: 33 % — ABNORMAL LOW (ref 43–77)
Platelets: 303 10*3/uL (ref 150–400)
RBC: 4.37 MIL/uL (ref 3.87–5.11)
RDW: 12.5 % (ref 11.5–15.5)
WBC: 4.3 10*3/uL (ref 4.0–10.5)

## 2014-10-21 LAB — LIPASE, BLOOD: Lipase: 19 U/L (ref 11–59)

## 2014-10-21 LAB — URINALYSIS, ROUTINE W REFLEX MICROSCOPIC
Bilirubin Urine: NEGATIVE
Glucose, UA: NEGATIVE mg/dL
Hgb urine dipstick: NEGATIVE
Ketones, ur: NEGATIVE mg/dL
Leukocytes, UA: NEGATIVE
Nitrite: NEGATIVE
PROTEIN: 30 mg/dL — AB
SPECIFIC GRAVITY, URINE: 1.02 (ref 1.005–1.030)
UROBILINOGEN UA: 1 mg/dL (ref 0.0–1.0)
pH: 7.5 (ref 5.0–8.0)

## 2014-10-21 LAB — COMPREHENSIVE METABOLIC PANEL
ALT: 17 U/L (ref 0–35)
AST: 25 U/L (ref 0–37)
Albumin: 4.1 g/dL (ref 3.5–5.2)
Alkaline Phosphatase: 54 U/L (ref 39–117)
Anion gap: 7 (ref 5–15)
BUN: 10 mg/dL (ref 6–23)
CHLORIDE: 106 mmol/L (ref 96–112)
CO2: 22 mmol/L (ref 19–32)
CREATININE: 1.02 mg/dL (ref 0.50–1.10)
Calcium: 8.6 mg/dL (ref 8.4–10.5)
GFR calc Af Amer: 87 mL/min — ABNORMAL LOW (ref >90)
GFR calc non Af Amer: 75 mL/min — ABNORMAL LOW (ref >90)
Glucose, Bld: 95 mg/dL (ref 70–99)
Potassium: 3.5 mmol/L (ref 3.5–5.1)
Sodium: 135 mmol/L (ref 135–145)
Total Bilirubin: 0.8 mg/dL (ref 0.3–1.2)
Total Protein: 7.5 g/dL (ref 6.0–8.3)

## 2014-10-21 LAB — RAPID URINE DRUG SCREEN, HOSP PERFORMED
AMPHETAMINES: NOT DETECTED
BENZODIAZEPINES: NOT DETECTED
Barbiturates: NOT DETECTED
Cocaine: POSITIVE — AB
Opiates: NOT DETECTED
Tetrahydrocannabinol: POSITIVE — AB

## 2014-10-21 LAB — POC URINE PREG, ED: PREG TEST UR: NEGATIVE

## 2014-10-21 LAB — URINE MICROSCOPIC-ADD ON

## 2014-10-21 MED ORDER — SODIUM CHLORIDE 0.9 % IV BOLUS (SEPSIS)
1000.0000 mL | Freq: Once | INTRAVENOUS | Status: AC
  Administered 2014-10-21: 1000 mL via INTRAVENOUS

## 2014-10-21 MED ORDER — ONDANSETRON 8 MG PO TBDP
8.0000 mg | ORAL_TABLET | Freq: Once | ORAL | Status: AC
  Administered 2014-10-21: 8 mg via ORAL
  Filled 2014-10-21: qty 1

## 2014-10-21 MED ORDER — ONDANSETRON HCL 8 MG PO TABS: 8.0000 mg | ORAL_TABLET | Freq: Three times a day (TID) | ORAL | Status: DC | PRN

## 2014-10-21 MED ORDER — ONDANSETRON HCL 4 MG/2ML IJ SOLN
4.0000 mg | Freq: Once | INTRAMUSCULAR | Status: AC
  Administered 2014-10-21: 4 mg via INTRAVENOUS
  Filled 2014-10-21: qty 2

## 2014-10-21 NOTE — ED Provider Notes (Signed)
CSN: [id number]     Arrival date & time 10/21/14  1019 History   First MD Initiated Contact with Patient 10/21/14 1137     Chief Complaint  Patient presents with  . Emesis     (Consider location/radiation/quality/duration/timing/severity/associated sxs/prior Treatment) HPI Comments: Natasha Byrd is a 29 y.o. female with a PMHx of depression, UTI, benzodiazepine abuse, ADHD, hx of SI/HI, and chronic headaches, who presents to the ED with complaints of nausea and vomiting that began around 9 AM. She is unsure of the number of episodes of emesis today, but states she believes is greater than 10 times, and she is vomiting up somewhat bilious emesis with no hematemesis or coffee-ground emesis. She denies any fevers, chills, chest pain, shortness breath, abdominal pain, diarrhea, constipation, melena, hematochezia, dysuria, hematuria, vaginal bleeding or discharge, numbness, tingling, weakness, sick contacts, suspicious food intake, travel, NSAID or EtOH use. Denies prior abd surgeries or any prior episodes of n/v like this. LMP 1 month ago.   Later she admits she smoked marijuana "like maybe 3 days ago".   Patient is a 29 y.o. female presenting with vomiting. The history is provided by the patient. No language interpreter was used.  Emesis Severity:  Severe Duration:  3 hours Timing:  Constant Number of daily episodes:  Unsure of exact number, >10x in 3 hrs Quality:  Bilious material Progression:  Unchanged Chronicity:  New Recent urination:  Normal Relieved by:  None tried Worsened by:  Nothing tried Ineffective treatments:  None tried Associated symptoms: no abdominal pain, no arthralgias, no chills, no diarrhea, no fever, no myalgias and no URI   Risk factors: no alcohol use, no prior abdominal surgery, no sick contacts, no suspect food intake and no travel to endemic areas     Past Medical History  Diagnosis Date  . Depression   . UTI (urinary tract infection)   . Benzodiazepine  abuse   . Attention deficit disorder with hyperactivity   . Headache   . History of suicidal ideation   . Homicidal ideations     History of,   Past Surgical History  Procedure Laterality Date  . Tonsillectomy     Family History  Problem Relation Age of Onset  . Abnormal EKG Mother   . Hypertension Mother   . Hypertension Brother   . Hypertension Maternal Grandmother   . Sleep apnea Father    History  Substance Use Topics  . Smoking status: Current Every Day Smoker -- 0.30 packs/day    Types: Cigarettes  . Smokeless tobacco: Never Used  . Alcohol Use: 2.4 oz/week    4 Cans of beer per week     Comment: Weekends   OB History    Gravida Para Term Preterm AB TAB SAB Ectopic Multiple Living   0 0     2     Review of Systems  Constitutional: Negative for fever and chills.  Respiratory: Negative for shortness of breath.   Cardiovascular: Negative for chest pain.  Gastrointestinal: Positive for nausea and vomiting. Negative for abdominal pain, diarrhea, constipation and blood in stool.  Genitourinary: Negative for dysuria, hematuria, flank pain, vaginal bleeding and vaginal discharge.  Musculoskeletal: Negative for myalgias, back pain and arthralgias.  Skin: Negative for color change.  Allergic/Immunologic: Negative for immunocompromised state.  Neurological: Negative for weakness, light-headedness and numbness.  Psychiatric/Behavioral: Negative for confusion.   10 Systems reviewed and are negative for acute change except as noted in the HPI.  Allergies  Review of patient's allergies indicates no known allergies.  Home Medications   Prior to Admission medications   Medication Sig Start Date End Date Taking? Authorizing Provider  ibuprofen (ADVIL,MOTRIN) 600 MG tablet Take 1 tablet (600 mg total) by mouth every 6 (six) hours. Patient not taking: Reported on 07/17/2014 03/29/13   Minta BalsamMichael R Odom, MD  metroNIDAZOLE (FLAGYL) 500 MG tablet Take 1 tablet (500 mg  total) by mouth 2 (two) times daily. Patient not taking: Reported on 07/17/2014 01/01/14   Reuben Likesavid C Keller, MD  Prenatal Vit-Fe Fumarate-FA (PRENATAL MULTIVITAMIN) TABS tablet Take 1 tablet by mouth daily at 12 noon.    Historical Provider, MD   BP 136/60 mmHg  Pulse 106  Temp(Src) 98.5 F (36.9 C) (Oral)  Resp 22  SpO2 99% Physical Exam  Constitutional: She is oriented to person, place, and time. She appears well-developed and well-nourished.  Non-toxic appearance. She appears distressed (vomiting).  Afebrile, nontoxic, actively vomiting, mildly tachycardic  HENT:  Head: Normocephalic and atraumatic.  Mouth/Throat: Oropharynx is clear and moist. Mucous membranes are dry.  Dry mucous membranes, actively vomiting  Eyes: Conjunctivae and EOM are normal. Right eye exhibits no discharge. Left eye exhibits no discharge.  Neck: Normal range of motion. Neck supple.  Cardiovascular: Regular rhythm, normal heart sounds and intact distal pulses.  Tachycardia present.  Exam reveals no gallop and no friction rub.   No murmur heard. Mildly tachycardic  Pulmonary/Chest: Effort normal and breath sounds normal. No respiratory distress. She has no decreased breath sounds. She has no wheezes. She has no rhonchi. She has no rales.  Abdominal: Soft. Normal appearance and bowel sounds are normal. She exhibits no distension. There is no tenderness. There is no rigidity, no rebound, no guarding, no CVA tenderness, no tenderness at McBurney's point and negative Murphy's sign.  Soft, NTND, +BS throughout, no r/g/r, neg murphy's, neg mcburney's, no CVA TTP  Actively vomiting during exam, difficult exam to obtain initially  Musculoskeletal: Normal range of motion.  Neurological: She is alert and oriented to person, place, and time. She has normal strength. No sensory deficit.  Skin: Skin is warm, dry and intact. No rash noted.  Psychiatric: She has a normal mood and affect.  Nursing note and vitals reviewed.   ED  Course  Procedures (including critical care time) Labs Review Labs Reviewed  CBC WITH DIFFERENTIAL/PLATELET - Abnormal; Notable for the following:    Neutrophils Relative % 33 (*)    Neutro Abs 1.4 (*)    Lymphocytes Relative 50 (*)    Monocytes Relative 14 (*)    All other components within normal limits  COMPREHENSIVE METABOLIC PANEL - Abnormal; Notable for the following:    GFR calc non Af Amer 75 (*)    GFR calc Af Amer 87 (*)    All other components within normal limits  URINALYSIS, ROUTINE W REFLEX MICROSCOPIC - Abnormal; Notable for the following:    Color, Urine AMBER (*)    Protein, ur 30 (*)    All other components within normal limits  URINE RAPID DRUG SCREEN (HOSP PERFORMED) - Abnormal; Notable for the following:    Cocaine POSITIVE (*)    Tetrahydrocannabinol POSITIVE (*)    All other components within normal limits  LIPASE, BLOOD  URINE MICROSCOPIC-ADD ON  POC URINE PREG, ED    Imaging Review No results found.   EKG Interpretation None      MDM   Final diagnoses:  Non-intractable vomiting with nausea, vomiting  of unspecified type  Cocaine use  Marijuana use    29 y.o. female here with N/V beginning this AM, emesis is bilious, no abd pain or tenderness, neg murphy's. Difficult exam due to actively vomiting, therefore will repeat abd exam after vomiting controlled. So far labs showing no acute abnormalities on CBC/CMP/lipase. Awaiting upreg and U/A. Will give fluids and zofran IV (pt vomited zofran PO). Will reassess shortly.   2:30 PM Upreg neg. U/A with dehydration but no UTI. UDS showing +THC and cocaine which could explain her symptoms. Nausea fully resolved, tolerating PO well. Repeat abd exam continues to be unremarkable. Discussed cessation of cocaine and marijuana. Will give zofran rx, and have her f/up with PCP. Discussed good hydration. VS improved after fluids. I explained the diagnosis and have given explicit precautions to return to the ER  including for any other new or worsening symptoms. The patient understands and accepts the medical plan as it's been dictated and I have answered their questions. Discharge instructions concerning home care and prescriptions have been given. The patient is STABLE and is discharged to home in good condition.  BP 129/85 mmHg  Pulse 80  Temp(Src) 98.5 F (36.9 C) (Oral)  Resp 18  SpO2 100%  Meds ordered this encounter  Medications  . ondansetron (ZOFRAN-ODT) disintegrating tablet 8 mg    Sig:   . ondansetron (ZOFRAN) injection 4 mg    Sig:   . sodium chloride 0.9 % bolus 1,000 mL    Sig:   . ondansetron (ZOFRAN) 8 MG tablet    Sig: Take 1 tablet (8 mg total) by mouth every 8 (eight) hours as needed for nausea or vomiting.    Dispense:  10 tablet    Refill:  0    Order Specific Question:  Supervising Provider    Answer:  Eber Hong [[id number]]     Trevia Nop Camprubi-Soms, PA-C 10/21/14 1438  Toy Cookey, MD 10/21/14 1952

## 2014-10-21 NOTE — ED Notes (Signed)
Per EMS pt comes from hotel c/o vomiting.  Pt vitals stable.

## 2014-10-21 NOTE — Discharge Instructions (Signed)
Use zofran as needed for nausea. Avoid cocaine and marijuana use. Stay well hydrated with small sips throughout the day. Avoid foods that trigger nausea. Follow up with your regular doctor in 1 week for recheck. Return to the ER for changes or worsening symptoms   Nausea and Vomiting Nausea is a sick feeling that often comes before throwing up (vomiting). Vomiting is a reflex where stomach contents come out of your mouth. Vomiting can cause severe loss of body fluids (dehydration). Children and elderly adults can become dehydrated quickly, especially if they also have diarrhea. Nausea and vomiting are symptoms of a condition or disease. It is important to find the cause of your symptoms. CAUSES   Direct irritation of the stomach lining. This irritation can result from increased acid production (gastroesophageal reflux disease), infection, food poisoning, taking certain medicines (such as nonsteroidal anti-inflammatory drugs), alcohol use, or tobacco use.  Signals from the brain.These signals could be caused by a headache, heat exposure, an inner ear disturbance, increased pressure in the brain from injury, infection, a tumor, or a concussion, pain, emotional stimulus, or metabolic problems.  An obstruction in the gastrointestinal tract (bowel obstruction).  Illnesses such as diabetes, hepatitis, gallbladder problems, appendicitis, kidney problems, cancer, sepsis, atypical symptoms of a heart attack, or eating disorders.  Medical treatments such as chemotherapy and radiation.  Receiving medicine that makes you sleep (general anesthetic) during surgery. DIAGNOSIS Your caregiver may ask for tests to be done if the problems do not improve after a few days. Tests may also be done if symptoms are severe or if the reason for the nausea and vomiting is not clear. Tests may include:  Urine tests.  Blood tests.  Stool tests.  Cultures (to look for evidence of infection).  X-rays or other imaging  studies. Test results can help your caregiver make decisions about treatment or the need for additional tests. TREATMENT You need to stay well hydrated. Drink frequently but in small amounts.You may wish to drink water, sports drinks, clear broth, or eat frozen ice pops or gelatin dessert to help stay hydrated.When you eat, eating slowly may help prevent nausea.There are also some antinausea medicines that may help prevent nausea. HOME CARE INSTRUCTIONS   Take all medicine as directed by your caregiver.  If you do not have an appetite, do not force yourself to eat. However, you must continue to drink fluids.  If you have an appetite, eat a normal diet unless your caregiver tells you differently.  Eat a variety of complex carbohydrates (rice, wheat, potatoes, bread), lean meats, yogurt, fruits, and vegetables.  Avoid high-fat foods because they are more difficult to digest.  Drink enough water and fluids to keep your urine clear or pale yellow.  If you are dehydrated, ask your caregiver for specific rehydration instructions. Signs of dehydration may include:  Severe thirst.  Dry lips and mouth.  Dizziness.  Dark urine.  Decreasing urine frequency and amount.  Confusion.  Rapid breathing or pulse. SEEK IMMEDIATE MEDICAL CARE IF:   You have blood or brown flecks (like coffee grounds) in your vomit.  You have black or bloody stools.  You have a severe headache or stiff neck.  You are confused.  You have severe abdominal pain.  You have chest pain or trouble breathing.  You do not urinate at least once every 8 hours.  You develop cold or clammy skin.  You continue to vomit for longer than 24 to 48 hours.  You have a fever. MAKE  SURE YOU:   Understand these instructions.  Will watch your condition.  Will get help right away if you are not doing well or get worse. Document Released: 07/31/2005 Document Revised: 10/23/2011 Document Reviewed:  12/28/2010 [facility] LPExitCare Patient Information 2015 [place], [place]. This information is not intended to replace advice given to you by your health care provider. Make sure you discuss any questions you have with your health care provider.  Marijuana Abuse Your exam shows you have used marijuana or pot. There are many health problems related to marijuana abuse. These include:  Bronchitis.  Chronic cough.  Emphysema.  Lung and upper airway cancer. Abusers also experience impairment in:  Memory.  Judgment.  Ability to learn.  Coordination. Students who smoke marijuana:  Get lower grades.  Are less likely to graduate than those who do not. Adults who abuse marijuana:  Have problems at work.  May even lose their jobs due to:  Poor work International aid/development workerperformance.  Absenteeism. Attention, memory, and learning skills have been shown to be diminished for up to 6 months after stopping regular use, and there is evidence that the effects can be cumulative over a lifetime.  Heavier use of marijuana also puts a strain on relationships with friends and loved ones and can lead to moodiness and loss of confidence. Acute intoxication can lead to:  Increased anxiety.  A panic episode. It also increases the risk for having an automobile accident. This is especially true if the pot is combined with alcohol or other intoxicants. Treatment for acute intoxication is rarely needed. However, medicine to reduce anxiety may be helpful in some people. Millions of people are considered to be dependent on marijuana. It is long-term regular use that leads to addiction and all of its complex problems. Information on the problem of addiction and the health problems of long-term abuse is posted at the [facility] CenterNational Institute for Drug Abuse website, http://www.price-smith.com/www.drugabuse.gov. Consult with your doctor or counselor if you want further information and support in handling this common problem. Document Released: 09/07/2004 Document Revised:  10/23/2011 Document Reviewed: 06/25/2007 [facility] Patient Information 2015 [place], [place]. This information is not intended to replace advice given to you by your health care provider. Make sure you discuss any questions you have with your health care provider.

## 2015-01-25 ENCOUNTER — Emergency Department (HOSPITAL_COMMUNITY)
Admission: EM | Admit: 2015-01-25 | Discharge: 2015-01-28 | Disposition: A | Discharge status: Home / Self Care | Payer: Self-pay

## 2015-01-25 ENCOUNTER — Encounter (HOSPITAL_COMMUNITY): Payer: Self-pay | Admitting: Emergency Medicine

## 2015-01-25 DIAGNOSIS — Z79899 Other long term (current) drug therapy: Secondary | ICD-10-CM | POA: Insufficient documentation

## 2015-01-25 DIAGNOSIS — F32A Depression, unspecified: Secondary | ICD-10-CM

## 2015-01-25 DIAGNOSIS — F1310 Sedative, hypnotic or anxiolytic abuse, uncomplicated: Secondary | ICD-10-CM | POA: Insufficient documentation

## 2015-01-25 DIAGNOSIS — Z720 Tobacco use: Secondary | ICD-10-CM | POA: Insufficient documentation

## 2015-01-25 DIAGNOSIS — F329 Major depressive disorder, single episode, unspecified: Secondary | ICD-10-CM | POA: Insufficient documentation

## 2015-01-25 DIAGNOSIS — F1210 Cannabis abuse, uncomplicated: Secondary | ICD-10-CM | POA: Insufficient documentation

## 2015-01-25 DIAGNOSIS — F29 Unspecified psychosis not due to a substance or known physiological condition: Secondary | ICD-10-CM | POA: Diagnosis present

## 2015-01-25 DIAGNOSIS — Z87440 Personal history of urinary (tract) infections: Secondary | ICD-10-CM | POA: Insufficient documentation

## 2015-01-25 LAB — COMPREHENSIVE METABOLIC PANEL
ALBUMIN: 4.2 g/dL (ref 3.5–5.0)
ALK PHOS: 49 U/L (ref 38–126)
ALT: 12 U/L — AB (ref 14–54)
AST: 21 U/L (ref 15–41)
Anion gap: 7 (ref 5–15)
BUN: 14 mg/dL (ref 6–20)
CHLORIDE: 107 mmol/L (ref 101–111)
CO2: 25 mmol/L (ref 22–32)
Calcium: 9.1 mg/dL (ref 8.9–10.3)
Creatinine, Ser: 0.88 mg/dL (ref 0.44–1.00)
GFR calc Af Amer: >60 mL/min (ref >60)
GLUCOSE: 87 mg/dL (ref 65–99)
POTASSIUM: 3.5 mmol/L (ref 3.5–5.1)
SODIUM: 139 mmol/L (ref 135–145)
TOTAL PROTEIN: 7.7 g/dL (ref 6.5–8.1)
Total Bilirubin: 0.5 mg/dL (ref 0.3–1.2)

## 2015-01-25 LAB — CBC
HCT: 40.6 % (ref 36.0–46.0)
HEMOGLOBIN: 13.3 g/dL (ref 12.0–15.0)
MCH: 31.8 pg (ref 26.0–34.0)
MCHC: 32.8 g/dL (ref 30.0–36.0)
MCV: 97.1 fL (ref 78.0–100.0)
PLATELETS: 258 10*3/uL (ref 150–400)
RBC: 4.18 MIL/uL (ref 3.87–5.11)
RDW: 12.5 % (ref 11.5–15.5)
WBC: 4.7 10*3/uL (ref 4.0–10.5)

## 2015-01-25 LAB — RAPID URINE DRUG SCREEN, HOSP PERFORMED
Amphetamines: NOT DETECTED
Barbiturates: NOT DETECTED
Benzodiazepines: POSITIVE — AB
Cocaine: NOT DETECTED
OPIATES: NOT DETECTED
Tetrahydrocannabinol: POSITIVE — AB

## 2015-01-25 LAB — ACETAMINOPHEN LEVEL: Acetaminophen (Tylenol), Serum: <10 ug/mL — ABNORMAL LOW (ref 10–30)

## 2015-01-25 LAB — ETHANOL

## 2015-01-25 LAB — SALICYLATE LEVEL: Salicylate Lvl: <4 mg/dL (ref 2.8–30.0)

## 2015-01-25 MED ORDER — IBUPROFEN 200 MG PO TABS
600.0000 mg | ORAL_TABLET | Freq: Three times a day (TID) | ORAL | Status: DC | PRN
  Filled 2015-01-25: qty 3

## 2015-01-25 MED ORDER — LORAZEPAM 1 MG PO TABS
1.0000 mg | ORAL_TABLET | Freq: Three times a day (TID) | ORAL | Status: DC | PRN
  Administered 2015-01-25 – 2015-01-27 (×4): 1 mg via ORAL
  Filled 2015-01-25 (×4): qty 1

## 2015-01-25 MED ORDER — ONDANSETRON HCL 4 MG PO TABS: 4.0000 mg | ORAL_TABLET | Freq: Three times a day (TID) | ORAL | Status: DC | PRN

## 2015-01-25 MED ORDER — ACETAMINOPHEN 325 MG PO TABS: 650.0000 mg | ORAL_TABLET | ORAL | Status: DC | PRN

## 2015-01-25 MED ORDER — NICOTINE 14 MG/24HR TD PT24
14.0000 mg | MEDICATED_PATCH | Freq: Once | TRANSDERMAL | Status: AC
  Administered 2015-01-25: 14 mg via TRANSDERMAL
  Filled 2015-01-25: qty 1

## 2015-01-25 NOTE — ED Notes (Signed)
Pt told sitter that she can wait until the a.m to leave because she feels if she left now, she would either kill  someone or herself.

## 2015-01-25 NOTE — ED Provider Notes (Signed)
CSN: [id number]     Arrival date & time 01/25/15  0947 History   First MD Initiated Contact with Patient 01/25/15 [id number]     Chief Complaint  Patient presents with  . Suicidal  . Homicidal     (Consider location/radiation/quality/duration/timing/severity/associated sxs/prior Treatment) HPI Comments: Patient presents to emergency department, brought in by police department. Patient reportedly has been threatening to harm herself and others. Patient reports that she had her children taken away from her by DSS one year ago. She has been trying to get them back DSS has denied her. In response to this, she is now wanting to kill herself and also says she wants to kill anyone involved in taking her children away.   Past Medical History  Diagnosis Date  . Depression   . UTI (urinary tract infection)   . Benzodiazepine abuse   . Attention deficit disorder with hyperactivity   . Headache   . History of suicidal ideation   . Homicidal ideations     History of,   Past Surgical History  Procedure Laterality Date  . Tonsillectomy     Family History  Problem Relation Age of Onset  . Abnormal EKG Mother   . Hypertension Mother   . Hypertension Brother   . Hypertension Maternal Grandmother   . Sleep apnea Father    History  Substance Use Topics  . Smoking status: Current Every Day Smoker -- 0.30 packs/day    Types: Cigarettes  . Smokeless tobacco: Never Used  . Alcohol Use: 2.4 oz/week    4 Cans of beer per week     Comment: Weekends   OB History    Gravida Para Term Preterm AB TAB SAB Ectopic Multiple Living   0 0     2     Review of Systems  Psychiatric/Behavioral: Positive for suicidal ideas and dysphoric mood.  All other systems reviewed and are negative.     Allergies  Review of patient's allergies indicates no known allergies.  Home Medications   Prior to Admission medications   Medication Sig Start Date End Date Taking? Authorizing Provider  ibuprofen  (ADVIL,MOTRIN) 600 MG tablet Take 1 tablet (600 mg total) by mouth every 6 (six) hours. Patient not taking: Reported on 07/17/2014 03/29/13   Minta Balsam, MD  metroNIDAZOLE (FLAGYL) 500 MG tablet Take 1 tablet (500 mg total) by mouth 2 (two) times daily. Patient not taking: Reported on 07/17/2014 01/01/14   Reuben Likes, MD  ondansetron (ZOFRAN) 8 MG tablet Take 1 tablet (8 mg total) by mouth every 8 (eight) hours as needed for nausea or vomiting. 10/21/14   Mercedes Camprubi-Soms, PA-C  Prenatal Vit-Fe Fumarate-FA (PRENATAL MULTIVITAMIN) TABS tablet Take 1 tablet by mouth daily at 12 noon.    Historical Provider, MD   BP 143/80 mmHg  Pulse 92  Temp(Src) 98.5 F (36.9 C) (Oral)  Resp 18  SpO2 100% Physical Exam  Constitutional: She is oriented to person, place, and time. She appears well-developed and well-nourished. No distress.  HENT:  Head: Normocephalic and atraumatic.  Right Ear: Hearing normal.  Left Ear: Hearing normal.  Nose: Nose normal.  Mouth/Throat: Oropharynx is clear and moist and mucous membranes are normal.  Eyes: Conjunctivae and EOM are normal. Pupils are equal, round, and reactive to light.  Neck: Normal range of motion. Neck supple.  Cardiovascular: Regular rhythm, S1 normal and S2 normal.  Exam reveals no gallop and no friction rub.   No murmur heard.  Pulmonary/Chest: Effort normal and breath sounds normal. No respiratory distress. She exhibits no tenderness.  Abdominal: Soft. Normal appearance and bowel sounds are normal. There is no hepatosplenomegaly. There is no tenderness. There is no rebound, no guarding, no tenderness at McBurney's point and negative Murphy's sign. No hernia.  Musculoskeletal: Normal range of motion.  Neurological: She is alert and oriented to person, place, and time. She has normal strength. No cranial nerve deficit or sensory deficit. Coordination normal. GCS eye subscore is 4. GCS verbal subscore is 5. GCS motor subscore is 6.  Skin: Skin is  warm, dry and intact. No rash noted. No cyanosis.  Psychiatric: Her speech is normal and behavior is normal. She exhibits a depressed mood. She expresses suicidal ideation.  Nursing note and vitals reviewed.   ED Course  Procedures (including critical care time) Labs Review Labs Reviewed  ACETAMINOPHEN LEVEL  CBC  COMPREHENSIVE METABOLIC PANEL  ETHANOL  SALICYLATE LEVEL  URINE RAPID DRUG SCREEN, HOSP PERFORMED    Imaging Review No results found.   EKG Interpretation None      MDM   Final diagnoses:  None   depression  Suicidal  Presents to the emergency department in custody of police. Patient reportedly threatened to kill herself earlier today, also reiterated this in front of the police. Upon arrival to the ER, patient is tearful. She reports that she is depressed over losing her children. She once again says she wants to kill herself and also admits to wanting to harm the people who took her children from her.    Gilda Crease, MD 01/25/15 1005
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
## 2015-01-25 NOTE — ED Notes (Signed)
Sitter at lunch - pt currently sleeping.  Being watched by myself while sitter on meal break

## 2015-01-25 NOTE — ED Notes (Signed)
Info pt of urine sample. Pt state someone already told her about sample. Pt is currently on phone.

## 2015-01-25 NOTE — ED Notes (Signed)
Pt request to know how to leave from facility. She admits to [facility] and [place] but denies them now. She states she did not have a plan.  She states she wants to leave to take care of some business because people aren't doing what they are suppose to do. Informed patient that  I notify the MD.

## 2015-01-25 NOTE — ED Notes (Signed)
Pt request to remove nicotine patch.

## 2015-01-25 NOTE — BH Assessment (Addendum)
[facility] Assessment Progress Note  The following facilities have been contacted to seek placement for this pt, with results as noted:  Beds available, information sent, decision pending:  [facility]  At capacity:  [facility]  Declined:  [facility] (no high acuity beds available on 6/13)   Doylene Canning, [place] Triage Specialist [phone]

## 2015-01-25 NOTE — ED Notes (Signed)
Brought in by [employer] voluntarily. Pt is SI/HI, pt lost kids approx 1 year ago and now wants them back. DSS is not granting that so now pt is SI/HI, tearful in triage. Pt made suicidal comment in front of [employer].
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
## 2015-01-25 NOTE — Progress Notes (Signed)
EDCM spoke to patient at bedside. Patient confirms she does not have a pcp or insurance living in [place].  EDCM provided patient with pamphlet to [facility], informed patient of services there and walk in times.  EDCM also provided patient with list of pcps who accept self pay patients, list of discount pharmacies and websites needymeds.org and GoodRX.com for medication assistance, phone number to inquire about the orange card, phone number to inquire about Mediciad, phone number to inquire about the Affordable Care Act, financial resources in the community such as local churches, salvation army, urban ministries, and dental assistance for uninsured patients.  Patient thankful for resources.  No further EDCM needs at this time. 
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
## 2015-01-25 NOTE — ED Notes (Signed)
Attempted to draw labs. TTS in conversation w\patient

## 2015-01-26 DIAGNOSIS — F29 Unspecified psychosis not due to a substance or known physiological condition: Secondary | ICD-10-CM

## 2015-01-26 DIAGNOSIS — R45851 Suicidal ideations: Secondary | ICD-10-CM

## 2015-01-26 MED ORDER — RISPERIDONE 1 MG PO TABS
1.0000 mg | ORAL_TABLET | Freq: Two times a day (BID) | ORAL | Status: DC
  Administered 2015-01-26 – 2015-01-28 (×4): 1 mg via ORAL
  Filled 2015-01-26 (×4): qty 1

## 2015-01-26 MED ORDER — DIPHENHYDRAMINE HCL 50 MG/ML IJ SOLN
INTRAMUSCULAR | Status: AC
  Administered 2015-01-26: 10:00:00
  Filled 2015-01-26: qty 1

## 2015-01-26 MED ORDER — OLANZAPINE 10 MG PO TBDP
10.0000 mg | ORAL_TABLET | Freq: Three times a day (TID) | ORAL | Status: DC | PRN
Start: 2015-01-26 — End: 2015-01-28

## 2015-01-26 MED ORDER — AMANTADINE HCL 100 MG PO CAPS
100.0000 mg | ORAL_CAPSULE | Freq: Two times a day (BID) | ORAL | Status: DC
  Administered 2015-01-26 – 2015-01-28 (×4): 100 mg via ORAL
  Filled 2015-01-26 (×7): qty 1

## 2015-01-26 MED ORDER — LORAZEPAM 2 MG/ML IJ SOLN: 2.0000 mg | Freq: Once | INTRAMUSCULAR | Status: DC

## 2015-01-26 MED ORDER — ZIPRASIDONE MESYLATE 20 MG IM SOLR
10.0000 mg | Freq: Once | INTRAMUSCULAR | Status: AC
  Administered 2015-01-26: 10 mg via INTRAMUSCULAR

## 2015-01-26 MED ORDER — DIPHENHYDRAMINE HCL 50 MG/ML IJ SOLN: 50.0000 mg | Freq: Once | INTRAMUSCULAR | Status: DC

## 2015-01-26 MED ORDER — ZIPRASIDONE MESYLATE 20 MG IM SOLR
INTRAMUSCULAR | Status: AC
  Filled 2015-01-26: qty 20

## 2015-01-26 MED ORDER — LORAZEPAM 2 MG/ML IJ SOLN
INTRAMUSCULAR | Status: AC
  Administered 2015-01-26: 11:00:00
  Filled 2015-01-26: qty 1

## 2015-01-26 NOTE — ED Notes (Signed)
Pt is asleep, breathing is WNL, does not appear to be in any distress, sitter is at bedside.   

## 2015-01-26 NOTE — ED Notes (Addendum)
Pt awoke and stated she did not want to talk. She stated,"I am homicidal to anyone that gets in my way." Pt also stated she felt suicidal as well. She does contract for safety at this time. Her affect is blunted and flat. She denies any hallucinations. Pt does contract for safety .at 9am _pt was crying stating,"I want my children back. They took them away. My one child will be 2 in August. I have not seen her since she was 52 days old. The other child is 31 years old. I lost my apartment and they took them from me." Pt can be very pleasant and soft spoken and then become very angry and mean. She does not want a nicotene patch stating,"yesterday it gave me a headache. "Pt earlier this am slammed the bathroom door and would not open it for a few minutes. She stated she does want to die and has HI thoughts to ,"kill random people."[id number] was talking to the doctor and NP and all of a sudden started to scream and jumped out of bed and curled up in a corner stating," See they are there get out of my room now." Police outside of the room.Pt took her clothes off and was instructed to put them back on. 10:35a-Pt is asleep now with siderails up. 10:45a-Pt is asleep now with regular respirations.

## 2015-01-26 NOTE — Consult Note (Signed)
[place] Psychiatry Consult   Reason for Consult:  Psychosis Referring Physician:  EDP Patient Identification: Natasha Byrd MRN:  [id number] Principal Diagnosis: Psychosis Diagnosis:   Patient Active Problem List   Diagnosis Date Noted  . Psychosis [F29] 01/26/2015    Priority: High  . Proteinuria in pregnancy, antepartum [O12.10] 12/19/2012  . Urinary tract infection, site not specified [N39.0] 09/30/2012  . Benzodiazepine abuse, continuous [F13.10] 03/30/2012  . Quit smoking [Z72.0] 08/17/2008  . ATTENTION DEFICIT, W/HYPERACTIVITY [F90.9] 10/11/2006  . HEADACHE, UNSPECIFIED [R51] 10/11/2006    Total Time spent with patient: 1 hour  Subjective:   Natasha Byrd is a 29 y.o. female patient admitted with PSYCHOSIS.  HPI:  AA female, 29 years old was evaluated for agitation, visual and auditory hallucination and severe anxiety.  Patient was brought in Voluntarily by [employer] for suicidal and homicidal ideation.  Patient was tearful during this assessment and stated that she was seeing shadows and hearing voices telling her to ask providers to leave her room.  Patient was crying and suddenly got out of her bed and ran to the corner to hide.  Patient then stripped naked needing staff members to run into her room and provided her needed privacy.   It is documented that patient is seeking custody of her two little children back from DSS and was denied. Patient was medicated with Geodon and Ativan and is now sleeping.  We have accepted patient for admission and we will be seeking placement at any facility with available bed.  HPI Elements:   Location:  Psychosis,. Quality:  severe, agitataion, auditory/visual hallucination. Severity:  severe. Timing:  acute. Duration:  unknown at this time. Context:  Seeking treatment for si/hi.  Past Medical History:  Past Medical History  Diagnosis Date  . Depression   . UTI (urinary tract infection)   . Benzodiazepine abuse   . Attention  deficit disorder with hyperactivity   . Headache   . History of suicidal ideation   . Homicidal ideations     History of,    Past Surgical History  Procedure Laterality Date  . Tonsillectomy     Family History:  Family History  Problem Relation Age of Onset  . Abnormal EKG Mother   . Hypertension Mother   . Hypertension Brother   . Hypertension Maternal Grandmother   . Sleep apnea Father    Social History:  History  Alcohol Use  . 2.4 oz/week  . 4 Cans of beer per week    Comment: Weekends     History  Drug Use No    History   Social History  . Marital Status: Single    Spouse Name: N/A  . Number of Children: N/A  . Years of Education: N/A   Social History Main Topics  . Smoking status: Current Every Day Smoker -- 0.30 packs/day    Types: Cigarettes  . Smokeless tobacco: Never Used  . Alcohol Use: 2.4 oz/week    4 Cans of beer per week     Comment: Weekends  . Drug Use: No  . Sexual Activity: Not Currently    Birth Control/ Protection: None   Other Topics Concern  . None   Social History Narrative   Additional Social History:    History of alcohol / drug use?: Yes Name of Substance 1: Marijuana  1 - Age of First Use: "teenager" 1 - Amount (size/oz): 2 blunts 1 - Frequency: daily  1 - Duration: unspecified 1 - Last  Use / Amount: yesterday, 2 blunts                   Allergies:  No Known Allergies  Labs:  Results for orders placed or performed during the hospital encounter of 01/25/15 (from the past 48 hour(s))  Acetaminophen level     Status: Abnormal   Collection Time: 01/25/15 11:45 AM  Result Value Ref Range   Acetaminophen (Tylenol), Serum <10 (L) 10 - 30 ug/mL    Comment:        THERAPEUTIC CONCENTRATIONS VARY SIGNIFICANTLY. A RANGE OF 10-30 ug/mL MAY BE AN EFFECTIVE CONCENTRATION FOR MANY PATIENTS. HOWEVER, SOME ARE BEST TREATED AT CONCENTRATIONS OUTSIDE THIS RANGE. ACETAMINOPHEN CONCENTRATIONS >150 ug/mL AT 4 HOURS  AFTER INGESTION AND >50 ug/mL AT 12 HOURS AFTER INGESTION ARE OFTEN ASSOCIATED WITH TOXIC REACTIONS.   CBC     Status: None   Collection Time: 01/25/15 11:45 AM  Result Value Ref Range   WBC 4.7 4.0 - 10.5 K/uL   RBC 4.18 3.87 - 5.11 MIL/uL   Hemoglobin 13.3 12.0 - 15.0 g/dL   HCT 40.6 36.0 - 46.0 %   MCV 97.1 78.0 - 100.0 fL   MCH 31.8 26.0 - 34.0 pg   MCHC 32.8 30.0 - 36.0 g/dL   RDW 12.5 11.5 - 15.5 %   Platelets 258 150 - 400 K/uL  Comprehensive metabolic panel     Status: Abnormal   Collection Time: 01/25/15 11:45 AM  Result Value Ref Range   Sodium 139 135 - 145 mmol/L   Potassium 3.5 3.5 - 5.1 mmol/L   Chloride 107 101 - 111 mmol/L   CO2 25 22 - 32 mmol/L   Glucose, Bld 87 65 - 99 mg/dL   BUN 14 6 - 20 mg/dL   Creatinine, Ser 0.88 0.44 - 1.00 mg/dL   Calcium 9.1 8.9 - 10.3 mg/dL   Total Protein 7.7 6.5 - 8.1 g/dL   Albumin 4.2 3.5 - 5.0 g/dL   AST 21 15 - 41 U/L   ALT 12 (L) 14 - 54 U/L   Alkaline Phosphatase 49 38 - 126 U/L   Total Bilirubin 0.5 0.3 - 1.2 mg/dL   GFR calc non Af Amer >60 >60 mL/min   GFR calc Af Amer >60 >60 mL/min    Comment: (NOTE) The eGFR has been calculated using the CKD EPI equation. This calculation has not been validated in all clinical situations. eGFR's persistently <60 mL/min signify possible Chronic Kidney Disease.    Anion gap 7 5 - 15  Ethanol (ETOH)     Status: None   Collection Time: 01/25/15 11:45 AM  Result Value Ref Range   Alcohol, Ethyl (B) <5 <5 mg/dL    Comment:        LOWEST DETECTABLE LIMIT FOR SERUM ALCOHOL IS 5 mg/dL FOR MEDICAL PURPOSES ONLY   Salicylate level     Status: None   Collection Time: 01/25/15 11:45 AM  Result Value Ref Range   Salicylate Lvl <6.2 2.8 - 30.0 mg/dL  Urine rapid drug screen (hosp performed)not at [facility]     Status: Abnormal   Collection Time: 01/25/15  3:52 PM  Result Value Ref Range   Opiates NONE DETECTED NONE DETECTED   Cocaine NONE DETECTED NONE DETECTED   Benzodiazepines  POSITIVE (A) NONE DETECTED   Amphetamines NONE DETECTED NONE DETECTED   Tetrahydrocannabinol POSITIVE (A) NONE DETECTED   Barbiturates NONE DETECTED NONE DETECTED    Comment:  DRUG SCREEN FOR MEDICAL PURPOSES ONLY.  IF CONFIRMATION IS NEEDED FOR ANY PURPOSE, NOTIFY LAB WITHIN 5 DAYS.        LOWEST DETECTABLE LIMITS FOR URINE DRUG SCREEN Drug Class       Cutoff (ng/mL) Amphetamine      1000 Barbiturate      200 Benzodiazepine   390 Tricyclics       300 Opiates          300 Cocaine          300 THC              50     Vitals: Blood pressure 107/54, pulse 61, temperature 99 F (37.2 C), temperature source Oral, resp. rate 18, SpO2 100 %, not currently breastfeeding.  Risk to Self: Suicidal Ideation: Yes-Currently Present Suicidal Intent: Yes-Currently Present Is patient at risk for suicide?: Yes Suicidal Plan?: Yes-Currently Present Specify Current Suicidal Plan: drink clorox Access to Means: Yes Specify Access to Suicidal Means: access to clorox What has been your use of drugs/alcohol within the last 12 months?: uses marijuana daily  How many times?: 2 Other Self Harm Risks:  (impulsive ) Triggers for Past Attempts: Other (Comment) (DSS took her kids away) Intentional Self Injurious Behavior: None Risk to Others: Homicidal Ideation: Yes-Currently Present Thoughts of Harm to Others: Yes-Currently Present Comment - Thoughts of Harm to Others: Thoughts to harm several people, has access to a gun Current Homicidal Intent: Yes-Currently Present Current Homicidal Plan: Yes-Currently Present Describe Current Homicidal Plan: Use gun and shoot several people Access to Homicidal Means: Yes Describe Access to Homicidal Means: access to gun at cousins house Identified Victim: DSS workers- Alvera Singh, Laney Pastor, ex boyfriend's new girlfriend History of harm to others?: Yes Assessment of Violence: In past 6-12 months Violent Behavior Description: "hitting other" Does  patient have access to weapons?: Yes (Comment) Criminal Charges Pending?:  (stole friends car- charges have not yet been pressed) Does patient have a court date: No Prior Inpatient Therapy: Prior Inpatient Therapy: Yes Prior Therapy Dates:  ("a long time ago) Prior Therapy Facilty/Provider(s): Unknown Reason for Treatment: unknown Prior Outpatient Therapy: Prior Outpatient Therapy: No Does patient have an ACCT team?: No Does patient have Intensive In-House Services?  : No Does patient have [facility] services? : No Does patient have P4CC services?: No  Current Facility-Administered Medications  Medication Dose Route Frequency Provider Last Rate Last Dose  . acetaminophen (TYLENOL) tablet 650 mg  650 mg Oral Q4H PRN Orpah Greek, MD      . amantadine (SYMMETREL) capsule 100 mg  100 mg Oral BID Chrystopher Stangl   100 mg at 01/26/15 1326  . ibuprofen (ADVIL,MOTRIN) tablet 600 mg  600 mg Oral Q8H PRN Orpah Greek, MD      . LORazepam (ATIVAN) tablet 1 mg  1 mg Oral Q8H PRN Orpah Greek, MD   1 mg at 01/25/15 2027  . OLANZapine zydis (ZYPREXA) disintegrating tablet 10 mg  10 mg Oral Q8H PRN Densil Ottey      . ondansetron (ZOFRAN) tablet 4 mg  4 mg Oral Q8H PRN Orpah Greek, MD      . risperiDONE (RISPERDAL) tablet 1 mg  1 mg Oral BID Sonora Catlin   1 mg at 01/26/15 1311  . TDaP (BOOSTRIX) injection 0.5 mL  0.5 mL Intramuscular Once Ugonna A Anyanwu, MD      . ziprasidone (GEODON) 20 MG injection  Current Outpatient Prescriptions  Medication Sig Dispense Refill  . ibuprofen (ADVIL,MOTRIN) 600 MG tablet Take 1 tablet (600 mg total) by mouth every 6 (six) hours. (Patient not taking: Reported on 07/17/2014) 30 tablet 0  . metroNIDAZOLE (FLAGYL) 500 MG tablet Take 1 tablet (500 mg total) by mouth 2 (two) times daily. (Patient not taking: Reported on 07/17/2014) 14 tablet 0  . ondansetron (ZOFRAN) 8 MG tablet Take 1 tablet (8 mg total) by mouth every 8  (eight) hours as needed for nausea or vomiting. (Patient not taking: Reported on 01/25/2015) 10 tablet 0    Musculoskeletal: Strength & Muscle Tone: within normal limits Gait & Station: normal Patient leans: N/A  Psychiatric Specialty Exam: Physical Exam  Review of Systems  Unable to perform ROS: mental acuity    Blood pressure 107/54, pulse 61, temperature 99 F (37.2 C), temperature source Oral, resp. rate 18, SpO2 100 %, not currently breastfeeding.There is no weight on file to calculate BMI.  General Appearance: Casual and Disheveled  Eye Contact::  Poor  Speech:  Clear and Coherent and Pressured  Volume:  Increased  Mood:  Angry, Anxious and Irritable  Affect:  Congruent  Thought Process:  Disorganized, Loose and Tangential  Orientation:  Full (Time, Place, and Person)  Thought Content:  Hallucinations: Auditory Command:  get providers out of her room Visual  Suicidal Thoughts:  Yes.  without intent/plan  Homicidal Thoughts:  No  Memory:  unable to obtain  Judgement:  Impaired  Insight:  Lacking  Psychomotor Activity:  Restlessness  Concentration:  Poor  Recall:  Poor  Fund of Knowledge:Poor  Language: Poor  Akathisia:  NA  Handed:  Right  AIMS (if indicated):     Assets:  Desire for Improvement  ADL's:  Impaired  Cognition: Impaired,  Moderate  Sleep:      Medical Decision Making: Review of Psycho-Social Stressors (1), Established Problem, Worsening (2), Review of Medication Regimen & Side Effects (2) and Review of New Medication or Change in Dosage (2)  Treatment Plan Summary: Daily contact with patient to assess and evaluate symptoms and progress in treatment and Medication management  Plan:  Start OLanzapine 10 mg po every 8 hours as needed for  Agitation, Risperdal 1 mg po bid for mood control,, Ativan 1 mg po every 8 hours as needed for agitation, Amantadine 100 mg po bid for EPS. Disposition:  Admit to inpatient Psychiatric unit  Delfin Gant    PMHNP-BC 01/26/2015 2:29 PM Patient seen face-to-face for psychiatric evaluation, chart reviewed and case discussed with the physician extender and developed treatment plan. Reviewed the information documented and agree with the treatment plan. Corena Pilgrim, MD

## 2015-01-26 NOTE — ED Notes (Signed)
Pt resting/ asleep.  Sitter at the bedside.

## 2015-01-26 NOTE — BH Assessment (Addendum)
[facility] Assessment Progress Note  The following facilities have been contacted to seek placement for this pt, with results as noted:  Beds available, information sent, decision pending:  [facility]  At capacity:  [facility]  Declined:  [facility] (clinical acuity)  Athalee Esterline, MA Triage Specialist [phone]     

## 2015-01-27 DIAGNOSIS — F29 Unspecified psychosis not due to a substance or known physiological condition: Secondary | ICD-10-CM | POA: Insufficient documentation

## 2015-01-27 MED ORDER — DIPHENHYDRAMINE HCL 50 MG/ML IJ SOLN
50.0000 mg | Freq: Once | INTRAMUSCULAR | Status: DC
  Filled 2015-01-27: qty 1

## 2015-01-27 MED ORDER — ZIPRASIDONE MESYLATE 20 MG IM SOLR
10.0000 mg | Freq: Once | INTRAMUSCULAR | Status: DC
  Filled 2015-01-27: qty 20

## 2015-01-27 MED ORDER — LORAZEPAM 2 MG/ML IJ SOLN
2.0000 mg | Freq: Once | INTRAMUSCULAR | Status: DC
  Filled 2015-01-27: qty 1

## 2015-01-27 MED ORDER — LIDOCAINE HCL 1 % IJ SOLN
INTRAMUSCULAR | Status: AC
  Filled 2015-01-27: qty 20

## 2015-01-27 NOTE — B[facility] Counselor (Signed)
[facility] Assessment Progress Note  Referrals sent for inpatient consideration via fax to [facility], [facility], [facility], [facility], [facility], [facility], [facility].  Johny Shock. Ladona Ridgel, MS, N[facility] Disposition Counselor

## 2015-01-27 NOTE — ED Notes (Signed)
Pt told sitter she wants to leave, because she is here voluntary. Psychiatry team notified, they stated the will come to speak with pt, and will IVC her. Waiting for team to round.

## 2015-01-27 NOTE — Consult Note (Signed)
[facility] Face-to-Face Psychiatry Consult   Reason for Consult:  Psychosis Referring Physician:  EDP Patient Identification: Natasha Byrd MRN:  [id number] Principal Diagnosis: Psychosis Diagnosis:   Patient Active Problem List   Diagnosis Date Noted  . Psychosis [F29] 01/26/2015    Priority: High  . Proteinuria in pregnancy, antepartum [O12.10] 12/19/2012  . Urinary tract infection, site not specified [N39.0] 09/30/2012  . Benzodiazepine abuse, continuous [F13.10] 03/30/2012  . Quit smoking [Z72.0] 08/17/2008  . ATTENTION DEFICIT, W/HYPERACTIVITY [F90.9] 10/11/2006  . HEADACHE, UNSPECIFIED [R51] 10/11/2006    Total Time spent with patient: 30 minutes  Subjective:   Natasha Byrd is a 29 y.o. female patient admitted with PSYCHOSIS.  HPI:  AA female, 29 years old was evaluated for agitation, visual and auditory hallucination and severe anxiety.  Patient was brought in Voluntarily by [employer] for suicidal and homicidal ideation.  Patient was tearful during this assessment and stated that she was seeing shadows and hearing voices telling her to ask providers to leave her room.  Patient was crying and suddenly got out of her bed and ran to the corner to hide.  Patient then stripped naked needing staff members to run into her room and provided her needed privacy.   It is documented that patient is seeking custody of her two little children back from DSS and was denied. Patient was medicated with Geodon and Ativan and is now sleeping.  We have accepted patient for admission and we will be seeking placement at any facility with available bed.  Evaluated today by providers.  She is asking for a discharge home to attend a parenting class.   Patient does not remember that she stripped naked yesterday and was seeing and hearing voices.  She also admitted that she needs intense MH evaluation and care that cannot be completed in ER setting.  Patient is now IVC and we will continue to look for  placement.  HPI Elements:   Location:  Psychosis,. Quality:  severe, agitataion, auditory/visual hallucination. Severity:  severe. Timing:  acute. Duration:  unknown at this time. Context:  Seeking treatment for si/hi.  Past Medical History:  Past Medical History  Diagnosis Date  . Depression   . UTI (urinary tract infection)   . Benzodiazepine abuse   . Attention deficit disorder with hyperactivity   . Headache   . History of suicidal ideation   . Homicidal ideations     History of,    Past Surgical History  Procedure Laterality Date  . Tonsillectomy     Family History:  Family History  Problem Relation Age of Onset  . Abnormal EKG Mother   . Hypertension Mother   . Hypertension Brother   . Hypertension Maternal Grandmother   . Sleep apnea Father    Social History:  History  Alcohol Use  . 2.4 oz/week  . 4 Cans of beer per week    Comment: Weekends     History  Drug Use No    History   Social History  . Marital Status: Single    Spouse Name: N/A  . Number of Children: N/A  . Years of Education: N/A   Social History Main Topics  . Smoking status: Current Every Day Smoker -- 0.30 packs/day    Types: Cigarettes  . Smokeless tobacco: Never Used  . Alcohol Use: 2.4 oz/week    4 Cans of beer per week     Comment: Weekends  . Drug Use: No  . Sexual Activity: Not  Currently    Birth Control/ Protection: None   Other Topics Concern  . None   Social History Narrative   Additional Social History:    History of alcohol / drug use?: Yes Name of Substance 1: Marijuana  1 - Age of First Use: "teenager" 1 - Amount (size/oz): 2 blunts 1 - Frequency: daily  1 - Duration: unspecified 1 - Last Use / Amount: yesterday, 2 blunts                   Allergies:  No Known Allergies  Labs:  Results for orders placed or performed during the hospital encounter of 01/25/15 (from the past 48 hour(s))  Urine rapid drug screen (hosp performed)not at [facility]      Status: Abnormal   Collection Time: 01/25/15  3:52 PM  Result Value Ref Range   Opiates NONE DETECTED NONE DETECTED   Cocaine NONE DETECTED NONE DETECTED   Benzodiazepines POSITIVE (A) NONE DETECTED   Amphetamines NONE DETECTED NONE DETECTED   Tetrahydrocannabinol POSITIVE (A) NONE DETECTED   Barbiturates NONE DETECTED NONE DETECTED    Comment:        DRUG SCREEN FOR MEDICAL PURPOSES ONLY.  IF CONFIRMATION IS NEEDED FOR ANY PURPOSE, NOTIFY LAB WITHIN 5 DAYS.        LOWEST DETECTABLE LIMITS FOR URINE DRUG SCREEN Drug Class       Cutoff (ng/mL) Amphetamine      1000 Barbiturate      200 Benzodiazepine   200 Tricyclics       300 Opiates          300 Cocaine          300 THC              50     Vitals: Blood pressure 110/59, pulse 53, temperature 97.9 F (36.6 C), temperature source Oral, resp. rate 18, SpO2 100 %, not currently breastfeeding.  Risk to Self: Suicidal Ideation: Yes-Currently Present Suicidal Intent: Yes-Currently Present Is patient at risk for suicide?: Yes Suicidal Plan?: Yes-Currently Present Specify Current Suicidal Plan: drink clorox Access to Means: Yes Specify Access to Suicidal Means: access to clorox What has been your use of drugs/alcohol within the last 12 months?: uses marijuana daily  How many times?: 2 Other Self Harm Risks:  (impulsive ) Triggers for Past Attempts: Other (Comment) (DSS took her kids away) Intentional Self Injurious Behavior: None Risk to Others: Homicidal Ideation: Yes-Currently Present Thoughts of Harm to Others: Yes-Currently Present Comment - Thoughts of Harm to Others: Thoughts to harm several people, has access to a gun Current Homicidal Intent: Yes-Currently Present Current Homicidal Plan: Yes-Currently Present Describe Current Homicidal Plan: Use gun and shoot several people Access to Homicidal Means: Yes Describe Access to Homicidal Means: access to gun at cousins house Identified Victim: DSS workers- Fayne Mediate,  Bunnie Philips, ex boyfriend's new girlfriend History of harm to others?: Yes Assessment of Violence: In past 6-12 months Violent Behavior Description: "hitting other" Does patient have access to weapons?: Yes (Comment) Criminal Charges Pending?:  (stole friends car- charges have not yet been pressed) Does patient have a court date: No Prior Inpatient Therapy: Prior Inpatient Therapy: Yes Prior Therapy Dates:  ("a long time ago) Prior Therapy Facilty/Provider(s): Unknown Reason for Treatment: unknown Prior Outpatient Therapy: Prior Outpatient Therapy: No Does patient have an ACCT team?: No Does patient have Intensive In-House Services?  : No Does patient have [facility] services? : No Does patient have P4CC services?: No  Current Facility-Administered Medications  Medication Dose Route Frequency Provider Last Rate Last Dose  . acetaminophen (TYLENOL) tablet 650 mg  650 mg Oral Q4H PRN Gilda Crease, MD      . amantadine (SYMMETREL) capsule 100 mg  100 mg Oral BID Tyna Huertas   Stopped at 01/27/15 1014  . ibuprofen (ADVIL,MOTRIN) tablet 600 mg  600 mg Oral Q8H PRN Gilda Crease, MD      . LORazepam (ATIVAN) tablet 1 mg  1 mg Oral Q8H PRN Gilda Crease, MD   1 mg at 01/27/15 0615  . OLANZapine zydis (ZYPREXA) disintegrating tablet 10 mg  10 mg Oral Q8H PRN Vivica Dobosz      . ondansetron (ZOFRAN) tablet 4 mg  4 mg Oral Q8H PRN Gilda Crease, MD      . risperiDONE (RISPERDAL) tablet 1 mg  1 mg Oral BID Rashaunda Rahl   Stopped at 01/27/15 1014  . TDaP (BOOSTRIX) injection 0.5 mL  0.5 mL Intramuscular Once Tereso Newcomer, MD       Current Outpatient Prescriptions  Medication Sig Dispense Refill  . ibuprofen (ADVIL,MOTRIN) 600 MG tablet Take 1 tablet (600 mg total) by mouth every 6 (six) hours. (Patient not taking: Reported on 07/17/2014) 30 tablet 0  . metroNIDAZOLE (FLAGYL) 500 MG tablet Take 1 tablet (500 mg total) by mouth 2 (two) times daily.  (Patient not taking: Reported on 07/17/2014) 14 tablet 0  . ondansetron (ZOFRAN) 8 MG tablet Take 1 tablet (8 mg total) by mouth every 8 (eight) hours as needed for nausea or vomiting. (Patient not taking: Reported on 01/25/2015) 10 tablet 0    Musculoskeletal: Strength & Muscle Tone: within normal limits Gait & Station: normal Patient leans: N/A  Psychiatric Specialty Exam: Physical Exam  Review of Systems  Unable to perform ROS: mental acuity    Blood pressure 110/59, pulse 53, temperature 97.9 F (36.6 C), temperature source Oral, resp. rate 18, SpO2 100 %, not currently breastfeeding.There is no weight on file to calculate BMI.  General Appearance: Casual and Disheveled  Eye Contact::  Poor  Speech:  Clear and Coherent and Pressured  Volume:  Increased  Mood:  Angry, Anxious and Irritable  Affect:  Congruent  Thought Process:  Disorganized, Loose and Tangential  Orientation:  Full (Time, Place, and Person)  Thought Content:  Hallucinations: Auditory Command:  get providers out of her room Visual  Suicidal Thoughts:  Yes.  without intent/plan  Homicidal Thoughts:  No  Memory:  unable to obtain  Judgement:  Impaired  Insight:  Lacking  Psychomotor Activity:  Restlessness  Concentration:  Poor  Recall:  Poor  Fund of Knowledge:Poor  Language: Poor  Akathisia:  NA  Handed:  Right  AIMS (if indicated):     Assets:  Desire for Improvement  ADL's:  Impaired  Cognition: Impaired,  Moderate  Sleep:      Medical Decision Making: Review of Psycho-Social Stressors (1), Established Problem, Worsening (2), Review of Medication Regimen & Side Effects (2) and Review of New Medication or Change in Dosage (2)  Treatment Plan Summary: Daily contact with patient to assess and evaluate symptoms and progress in treatment and Medication management  Plan:   Continue these prescribed medications. Start OLanzapine 10 mg po every 8 hours as needed for  Agitation, Risperdal 1 mg po bid for  mood control,, Ativan 1 mg po every 8 hours as needed for agitation, Amantadine 100 mg po bid for EPS.  IVC paper  work done by Dr Jannifer Franklin since patient want to leave without completing treatment. Disposition:  Admit to inpatient Psychiatric unit  Earney Navy   PMHNP-BC 01/27/2015 11:58 AM Patient seen face-to-face for psychiatric evaluation, chart reviewed and case discussed with the physician extender and developed treatment plan. Reviewed the information documented and agree with the treatment plan. Thedore Mins, MD

## 2015-01-27 NOTE — BH Assessment (Signed)
Per Apolonio Schneiders at [facility]: Pt has been  accepted to [facility] to arrive anytime after 7 am 01-28-15 under the care of Dr. Robet Leu, report to be called to [phone]. Informed TCU.   Clista Bernhardt, [facility] Triage Specialist 01/27/2015 7:20 PM

## 2015-01-27 NOTE — ED Notes (Signed)
Pt is asleep, breathing is WNL, does not appear to be in any distress, sitter is at bedside.   

## 2015-01-27 NOTE — ED Notes (Signed)
Rn provided pt with resource printed from [employer], per pt's request for additional information regarding facility she's being sent to in the morning.

## 2015-01-27 NOTE — ED Notes (Signed)
Ashleigh from [facility] called to inquire about patient's appropriateness for facility.

## 2015-01-27 NOTE — ED Notes (Signed)
Rn observed pt with tongue ring. Rn removed tongue ring from pt and placed it locker 26 with other belongings.

## 2015-01-27 NOTE — ED Notes (Signed)
Patient is now calm and cooperative at this time. Benadryl,Ativan and Geodon are on hold

## 2015-01-27 NOTE — ED Notes (Addendum)
Pt became upset and agitated by the increase in volume on the department. Pt began to use exploitive and direct threat toward sitter staff. RN explained the need to calm down and requested her to go back to sleep. Pt is calm at the moment laying down.

## 2015-01-27 NOTE — ED Notes (Signed)
Psychiatry at bedside.

## 2015-01-27 NOTE — ED Notes (Signed)
Patients behavior is beginning to escalate. Pt feels like she should not be here. She denies SI/HI at this time despite previous reports.

## 2015-01-27 NOTE — ED Notes (Signed)
Patient crying, agitated, does not understand why she cannot leave.  Explained to patient that she was served by the [employer] an is involuntary due to being a danger to self and others.  Also explained Psychiatry will determine when she can leave.  Offered medication to calm her, she accepted.

## 2015-01-27 NOTE — ED Notes (Addendum)
Pt awoke from her sleep momentarily and stated "I'm going to kill all them that made me mad when I close my eyes". Pt then began to cry.

## 2015-01-28 NOTE — ED Notes (Signed)
Attempted to call report to [facility] and was told that the staff would call back after their medication pass.

## 2016-03-17 ENCOUNTER — Encounter (HOSPITAL_COMMUNITY): Payer: Self-pay | Admitting: Emergency Medicine

## 2016-03-17 ENCOUNTER — Emergency Department (HOSPITAL_COMMUNITY)
Admission: EM | Admit: 2016-03-17 | Discharge: 2016-03-17 | Disposition: A | Discharge status: Home / Self Care | Payer: Self-pay | Attending: Emergency Medicine | Admitting: Emergency Medicine

## 2016-03-17 DIAGNOSIS — F17210 Nicotine dependence, cigarettes, uncomplicated: Secondary | ICD-10-CM | POA: Insufficient documentation

## 2016-03-17 DIAGNOSIS — Z792 Long term (current) use of antibiotics: Secondary | ICD-10-CM | POA: Insufficient documentation

## 2016-03-17 DIAGNOSIS — Z791 Long term (current) use of non-steroidal anti-inflammatories (NSAID): Secondary | ICD-10-CM | POA: Insufficient documentation

## 2016-03-17 DIAGNOSIS — J358 Other chronic diseases of tonsils and adenoids: Secondary | ICD-10-CM | POA: Insufficient documentation

## 2016-03-17 DIAGNOSIS — J029 Acute pharyngitis, unspecified: Secondary | ICD-10-CM

## 2016-03-17 MED ORDER — DEXAMETHASONE SODIUM PHOSPHATE 10 MG/ML IJ SOLN
10.0000 mg | Freq: Once | INTRAMUSCULAR | Status: AC
  Administered 2016-03-17: 10 mg via INTRAMUSCULAR
  Filled 2016-03-17: qty 1

## 2016-03-17 MED ORDER — PENICILLIN G BENZATHINE 1200000 UNIT/2ML IM SUSP
1.2000 10*6.[IU] | Freq: Once | INTRAMUSCULAR | Status: AC
  Administered 2016-03-17: 1.2 10*6.[IU] via INTRAMUSCULAR
  Filled 2016-03-17: qty 2

## 2016-03-17 NOTE — ED Notes (Signed)
Bed: WA07 Expected date:  Expected time:  Means of arrival:  Comments: 103F/throat swelling/sats normal

## 2016-03-17 NOTE — Discharge Instructions (Signed)
You have been treated for strep throat. You may wish to use chloraseptic spray and/or lozenges to help with throat pain in the interim until your antibiotics kick in. Follow-up with your primary care doctor. Return to the ED for new or worsening symptoms.

## 2016-03-17 NOTE — ED Triage Notes (Signed)
Per EMS, patient is complaining of a sore throat and ear ache since yesterday morning. Patient's tonsil appears swollen.

## 2016-03-17 NOTE — ED Provider Notes (Signed)
[facility]-EMERGENCY DEPT Provider Note   CSN: [id number] Arrival date & time: 03/17/16  1054  First Provider Contact:  First MD Initiated Contact with Patient 03/17/16 1104        History   Chief Complaint Chief Complaint  Patient presents with  . Sore Throat    HPI Natasha Byrd is a 30 y.o. female.  The history is provided by the patient and medical records.  Sore Throat    30 y.o. F with hx of ADD, depression, headache, presenting to the ED for sore throat.  Patient reports this began yesterday afternoon and has been progressively worsening.  States she feels her tonsils are swollen and it has become painful for her to talk and swallow.  States it is making her left ear hurt as well. She reports subjective fever at home.  Denies chills or sweats.  No sick contacts. No cough, nasal congestion, sinus pressure, headache, nausea, or vomiting.  No medication tried PTA.  VSS.  Past Medical History:  Diagnosis Date  . Attention deficit disorder with hyperactivity   . Benzodiazepine abuse   . Depression   . Headache   . History of suicidal ideation   . Homicidal ideations    History of,  . UTI (urinary tract infection)     Patient Active Problem List   Diagnosis Date Noted  . Psychoses   . Psychosis 01/26/2015  . Proteinuria in pregnancy, antepartum 12/19/2012  . Urinary tract infection, site not specified 09/30/2012  . Benzodiazepine abuse, continuous 03/30/2012  . Quit smoking 08/17/2008  . ATTENTION DEFICIT, W/HYPERACTIVITY 10/11/2006  . HEADACHE, UNSPECIFIED 10/11/2006    Past Surgical History:  Procedure Laterality Date  . TONSILLECTOMY      OB History    Gravida Para Term Preterm AB Living   0 0 2   SAB TAB Ectopic Multiple Live Births           2       Home Medications    Prior to Admission medications   Medication Sig Start Date End Date Taking? Authorizing Provider  ibuprofen (ADVIL,MOTRIN) 600 MG tablet Take 1 tablet (600 mg total) by mouth  every 6 (six) hours. Patient not taking: Reported on 07/17/2014 03/29/13   Minta Balsam, MD  metroNIDAZOLE (FLAGYL) 500 MG tablet Take 1 tablet (500 mg total) by mouth 2 (two) times daily. Patient not taking: Reported on 07/17/2014 01/01/14   Reuben Likes, MD  ondansetron (ZOFRAN) 8 MG tablet Take 1 tablet (8 mg total) by mouth every 8 (eight) hours as needed for nausea or vomiting. Patient not taking: Reported on 01/25/2015 10/21/14   Mercedes Camprubi-Soms, PA-C    Family History Family History  Problem Relation Age of Onset  . Abnormal EKG Mother   . Hypertension Mother   . Sleep apnea Father   . Hypertension Brother   . Hypertension Maternal Grandmother     Social History Social History  Substance Use Topics  . Smoking status: Current Every Day Smoker    Packs/day: 0.30    Types: Cigarettes  . Smokeless tobacco: Never Used  . Alcohol use 2.4 oz/week    4 Cans of beer per week     Comment: Weekends     Allergies   Review of patient's allergies indicates no known allergies.   Review of Systems Review of Systems  HENT: Positive for ear pain and sore throat.   All other systems reviewed and are negative.  Physical Exam Updated Vital Signs BP 137/82 (BP Location: Left Arm)   Pulse 88   Temp 98.2 F (36.8 C)   Resp 16   LMP 02/26/2016   SpO2 97%   Physical Exam  Constitutional: She is oriented to person, place, and time. She appears well-developed and well-nourished.  HENT:  Head: Normocephalic and atraumatic.  Right Ear: Tympanic membrane and ear canal normal.  Left Ear: Tympanic membrane and ear canal normal.  Nose: Nose normal. Right sinus exhibits no maxillary sinus tenderness and no frontal sinus tenderness. Left sinus exhibits no maxillary sinus tenderness and no frontal sinus tenderness.  Mouth/Throat: Uvula is midline. Posterior oropharyngeal erythema present.  Tonsils 2+ bilaterally with exudates present; uvula midline without evidence of  peritonsillar abscess; handling secretions appropriately; no difficulty swallowing or speaking; normal phonation without stridor  Eyes: Conjunctivae and EOM are normal. Pupils are equal, round, and reactive to light.  Neck: Normal range of motion.  Cardiovascular: Normal rate, regular rhythm and normal heart sounds.   Pulmonary/Chest: Effort normal and breath sounds normal.  Abdominal: Soft. Bowel sounds are normal.  Musculoskeletal: Normal range of motion.  Lymphadenopathy:    She has cervical adenopathy.  Neurological: She is alert and oriented to person, place, and time.  Skin: Skin is warm and dry.  Psychiatric: She has a normal mood and affect.  Nursing note and vitals reviewed.    ED Treatments / Results  Labs (all labs ordered are listed, but only abnormal results are displayed) Labs Reviewed - No data to display  EKG  EKG Interpretation None       Radiology No results found.  Procedures Procedures (including critical care time)  Medications Ordered in ED Medications - No data to display   Initial Impression / Assessment and Plan / ED Course  I have reviewed the triage vital signs and the nursing notes.  Pertinent labs & imaging results that were available during my care of the patient were reviewed by me and considered in my medical decision making (see chart for details).  Clinical Course   30 year old female here with sore throat and subjective fever. She is afebrile and nontoxic here. Her tonsils are enlarged bilaterally with exudates noted. Her uvula remains midline. She is handling secretions well, normal phonation without stridor. No evidence of peritonsillar abscess at this time. He'll be treated empirically for strep pharyngitis with Bicillin and Decadron here in ED. Encouraged follow-up with her primary care doctor.  Discussed plan with patient, she acknowledged understanding and agreed with plan of care.  Return precautions given for new or worsening  symptoms.  Final Clinical Impressions(s) / ED Diagnoses   Final diagnoses:  Sore throat  Tonsillar exudate    New Prescriptions Discharge Medication List as of 03/17/2016 11:57 AM       Garlon Hatchet, PA-C 03/17/16 1214    Linwood Dibbles, MD 03/21/16 [phone]

## 2019-07-25 ENCOUNTER — Encounter (HOSPITAL_COMMUNITY): Payer: Self-pay | Admitting: Student

## 2019-07-25 ENCOUNTER — Emergency Department (HOSPITAL_COMMUNITY)
Admission: EM | Admit: 2019-07-25 | Discharge: 2019-07-25 | Disposition: A | Discharge status: Home / Self Care | Payer: Self-pay | Attending: Emergency Medicine | Admitting: Emergency Medicine

## 2019-07-25 ENCOUNTER — Other Ambulatory Visit: Payer: Self-pay

## 2019-07-25 DIAGNOSIS — F17210 Nicotine dependence, cigarettes, uncomplicated: Secondary | ICD-10-CM | POA: Insufficient documentation

## 2019-07-25 DIAGNOSIS — R112 Nausea with vomiting, unspecified: Secondary | ICD-10-CM | POA: Insufficient documentation

## 2019-07-25 DIAGNOSIS — F909 Attention-deficit hyperactivity disorder, unspecified type: Secondary | ICD-10-CM | POA: Insufficient documentation

## 2019-07-25 DIAGNOSIS — R0789 Other chest pain: Secondary | ICD-10-CM | POA: Insufficient documentation

## 2019-07-25 LAB — COMPREHENSIVE METABOLIC PANEL
ALT: 16 U/L (ref 0–44)
AST: 39 U/L (ref 15–41)
Albumin: 4.4 g/dL (ref 3.5–5.0)
Alkaline Phosphatase: 63 U/L (ref 38–126)
Anion gap: 15 (ref 5–15)
BUN: 14 mg/dL (ref 6–20)
CO2: 14 mmol/L — ABNORMAL LOW (ref 22–32)
Calcium: 9.6 mg/dL (ref 8.9–10.3)
Chloride: 111 mmol/L (ref 98–111)
Creatinine, Ser: 0.89 mg/dL (ref 0.44–1.00)
GFR calc Af Amer: >60 mL/min (ref >60)
GFR calc non Af Amer: >60 mL/min (ref >60)
Glucose, Bld: 116 mg/dL — ABNORMAL HIGH (ref 70–99)
Potassium: 5.7 mmol/L — ABNORMAL HIGH (ref 3.5–5.1)
Sodium: 140 mmol/L (ref 135–145)
Total Bilirubin: 0.7 mg/dL (ref 0.3–1.2)
Total Protein: 7.6 g/dL (ref 6.5–8.1)

## 2019-07-25 LAB — CBC
HCT: 40.8 % (ref 36.0–46.0)
Hemoglobin: 13.8 g/dL (ref 12.0–15.0)
MCH: 30.9 pg (ref 26.0–34.0)
MCHC: 33.8 g/dL (ref 30.0–36.0)
MCV: 91.5 fL (ref 80.0–100.0)
Platelets: 314 10*3/uL (ref 150–400)
RBC: 4.46 MIL/uL (ref 3.87–5.11)
RDW: 12.3 % (ref 11.5–15.5)
WBC: 7.7 10*3/uL (ref 4.0–10.5)
nRBC: 0 % (ref 0.0–0.2)

## 2019-07-25 LAB — I-STAT BETA HCG BLOOD, ED (MC, WL, AP ONLY): I-stat hCG, quantitative: <5 m[IU]/mL (ref <5)

## 2019-07-25 LAB — LIPASE, BLOOD: Lipase: 16 U/L (ref 11–51)

## 2019-07-25 MED ORDER — ONDANSETRON 4 MG PO TBDP: 4.0000 mg | ORAL_TABLET | Freq: Three times a day (TID) | ORAL | 0 refills | Status: DC | PRN

## 2019-07-25 MED ORDER — SODIUM CHLORIDE 0.9 % IV BOLUS
1000.0000 mL | Freq: Once | INTRAVENOUS | Status: AC
  Administered 2019-07-25: 1000 mL via INTRAVENOUS

## 2019-07-25 MED ORDER — FAMOTIDINE IN NACL 20-0.9 MG/50ML-% IV SOLN
20.0000 mg | Freq: Once | INTRAVENOUS | Status: AC
  Administered 2019-07-25: 20 mg via INTRAVENOUS
  Filled 2019-07-25: qty 50

## 2019-07-25 MED ORDER — SODIUM CHLORIDE 0.9% FLUSH
3.0000 mL | Freq: Once | INTRAVENOUS | Status: AC
  Administered 2019-07-25: 3 mL via INTRAVENOUS

## 2019-07-25 MED ORDER — ONDANSETRON 4 MG PO TBDP
4.0000 mg | ORAL_TABLET | Freq: Once | ORAL | Status: AC
  Administered 2019-07-25: 11:00:00 4 mg via ORAL
  Filled 2019-07-25: qty 1

## 2019-07-25 MED ORDER — LORAZEPAM 2 MG/ML IJ SOLN
1.0000 mg | Freq: Once | INTRAMUSCULAR | Status: AC
  Administered 2019-07-25: 1 mg via INTRAVENOUS
  Filled 2019-07-25: qty 1

## 2019-07-25 NOTE — Discharge Instructions (Signed)
You were seen in the emergency department today for nausea and vomiting after consuming alcohol last night.  Please try to decrease your alcohol intake.  Your labs overall reassuring.  Your bicarb was low and your potassium was high.  We suspect your potassium was high due to an abnormality in the lab.  Please have each of these rechecked within 3 days.  Follow-up with your primary care provider within 3 days.  In the meantime please take Zofran every 8 hours as needed for nausea and vomiting.  We have prescribed you new medication(s) today. Discuss the medications prescribed today with your pharmacist as they can have adverse effects and interactions with your other medicines including over the counter and prescribed medications. Seek medical evaluation if you start to experience new or abnormal symptoms after taking one of these medicines, seek care immediately if you start to experience difficulty breathing, feeling of your throat closing, facial swelling, or rash as these could be indications of a more serious allergic reaction  Please follow-up with primary care within 3 days.  Return to the ER for new or worsening symptoms including but limited to inability keep fluids down, fever, increased pain, passing out, or any other concerns.

## 2019-07-25 NOTE — ED Triage Notes (Signed)
Pt here vomiting after 10-12 shots of patron last night. VSS.

## 2019-07-25 NOTE — ED Provider Notes (Signed)
[facility] Note   CSN: [id number] Arrival date & time: 07/25/19  1012     History Chief Complaint  Patient presents with  . Emesis    Natasha Byrd is a 33 y.o. female with a history of tobacco abuse, ADHD, depression, & prior psychosis who presents to the ED with complaints of vomiting since 2 AM.  Patient states that last night she drank several shots of Patron prior to going to bed and woke up with too numerous to count episodes of emesis.  She is not been able to keep anything down.  She states she has a burning and soreness in her chest/epigastric area from all the vomiting.  No alleviating or aggravating factors.  No intervention prior to arrival.  She is very anxious.  She denies fever, chills, URI symptoms, dyspnea, diarrhea, melena, hematochezia, dysuria, or vaginal bleeding/discharge.  HPI     Past Medical History:  Diagnosis Date  . Attention deficit disorder with hyperactivity   . Benzodiazepine abuse   . Depression   . Headache   . History of suicidal ideation   . Homicidal ideations    History of,  . UTI (urinary tract infection)     Patient Active Problem List   Diagnosis Date Noted  . Psychoses (HCC)   . Psychosis (HCC) 01/26/2015  . Proteinuria in pregnancy, antepartum 12/19/2012  . Urinary tract infection, site not specified 09/30/2012  . Benzodiazepine abuse, continuous (HCC) 03/30/2012  . Quit smoking 08/17/2008  . ATTENTION DEFICIT, W/HYPERACTIVITY 10/11/2006  . HEADACHE, UNSPECIFIED 10/11/2006    Past Surgical History:  Procedure Laterality Date  . TONSILLECTOMY       OB History    Gravida  2   Para  2   Term  2   Preterm  0   AB  0   Living  2     SAB      TAB      Ectopic      Multiple      Live Births  2           Family History  Problem Relation Age of Onset  . Abnormal EKG Mother   . Hypertension Mother   . Sleep apnea Father   . Hypertension Brother   .  Hypertension Maternal Grandmother     Social History   Tobacco Use  . Smoking status: Current Every Day Smoker    Packs/day: 0.30    Types: Cigarettes  . Smokeless tobacco: Never Used  Substance Use Topics  . Alcohol use: Yes    Alcohol/week: 4.0 standard drinks    Types: 4 Cans of beer per week    Comment: Weekends  . Drug use: No    Types: Marijuana    Home Medications Prior to Admission medications   Medication Sig Start Date End Date Taking? Authorizing Provider  ibuprofen (ADVIL,MOTRIN) 600 MG tablet Take 1 tablet (600 mg total) by mouth every 6 (six) hours. Patient not taking: Reported on 07/17/2014 03/29/13   Minta Balsamdom, Michael R, MD  metroNIDAZOLE (FLAGYL) 500 MG tablet Take 1 tablet (500 mg total) by mouth 2 (two) times daily. Patient not taking: Reported on 07/17/2014 01/01/14   Reuben LikesKeller, David C, MD  ondansetron (ZOFRAN) 8 MG tablet Take 1 tablet (8 mg total) by mouth every 8 (eight) hours as needed for nausea or vomiting. Patient not taking: Reported on 01/25/2015 10/21/14   Street, Bondu[place]Mercedes, [place]PA-C    Allergies  Patient has no known allergies.  Review of Systems   Review of Systems  Constitutional: Negative for chills and fever.  Respiratory: Negative for shortness of breath.   Cardiovascular: Positive for chest pain.  Gastrointestinal: Positive for abdominal pain, nausea and vomiting. Negative for blood in stool, constipation and diarrhea.  Genitourinary: Negative for dysuria, vaginal bleeding and vaginal discharge.  Neurological: Negative for syncope.  All other systems reviewed and are negative.   Physical Exam Updated Vital Signs BP 116/75   Pulse (!) 55   Temp 98.4 F (36.9 C) (Oral)   Resp (!) 24   SpO2 99%   Physical Exam Vitals and nursing note reviewed.  Constitutional:      Appearance: She is well-developed. She is not toxic-appearing.     Comments: Patient is tearful, hyperventilating, but does not appear to be in acute distress.  HENT:     Head:  Normocephalic and atraumatic.  Eyes:     General:        Right eye: No discharge.        Left eye: No discharge.     Conjunctiva/sclera: Conjunctivae normal.  Cardiovascular:     Rate and Rhythm: Normal rate and regular rhythm.  Pulmonary:     Effort: No respiratory distress.     Breath sounds: Normal breath sounds. No wheezing, rhonchi or rales.  Abdominal:     General: There is no distension.     Palpations: Abdomen is soft.     Tenderness: There is no abdominal tenderness. There is no right CVA tenderness, left CVA tenderness, guarding or rebound.  Musculoskeletal:     Cervical back: Neck supple.  Skin:    General: Skin is warm and dry.     Findings: No rash.  Neurological:     Mental Status: She is alert.     Comments: Clear speech.   Psychiatric:        Mood and Affect: Mood is anxious.        Behavior: Behavior is agitated.     ED Results / Procedures / Treatments   Labs (all labs ordered are listed, but only abnormal results are displayed) Labs Reviewed  COMPREHENSIVE METABOLIC PANEL - Abnormal; Notable for the following components:      Result Value   Potassium 5.7 (*)    CO2 14 (*)    Glucose, Bld 116 (*)    All other components within normal limits  LIPASE, BLOOD  CBC  URINALYSIS, ROUTINE W REFLEX MICROSCOPIC  I-STAT BETA HCG BLOOD, ED ([facility], [facility], [facility] ONLY)    EKG EKG Interpretation  Date/Time:  Friday July 25 2019 12:41:22 EST Ventricular Rate:  49 PR Interval:    QRS Duration: 91 QT Interval:  473 QTC Calculation: 427 R Axis:   49 Text Interpretation: Sinus bradycardia Atrial premature complex No previous ECGs available Confirmed by Theotis Burrow [phone]) on 07/25/2019 2:21:29 PM   Radiology No results found.  Procedures Procedures (including critical care time)  Medications Ordered in ED Medications  sodium chloride flush (NS) 0.9 % injection 3 mL (3 mLs Intravenous Given 07/25/19 1209)  ondansetron (ZOFRAN-ODT) disintegrating tablet 4  mg (4 mg Oral Given 07/25/19 1045)  sodium chloride 0.9 % bolus 1,000 mL (0 mLs Intravenous Stopped 07/25/19 1435)  LORazepam (ATIVAN) injection 1 mg (1 mg Intravenous Given 07/25/19 1214)  famotidine (PEPCID) IVPB 20 mg premix (0 mg Intravenous Stopped 07/25/19 1407)    ED Course/MDM I have reviewed the triage vital signs and the  nursing notes.  Pertinent labs & imaging results that were available during my care of the patient were reviewed by me and considered in my medical decision making (see chart for details).  Patient presents to the emergency department with complaints of nausea and vomiting that began at 2 AM after consuming liquor last night.  She is hyperventilating, seems very anxious, she is tearful.  Her vitals are without significant abnormality, BP elevated, doubt HTN emergency.  She has a nontender abdomen without peritoneal signs.  Her labs per triage have been reviewed: Pregnancy test: Negative CBC: No anemia or leukocytosis CMP: Hyperkalemia of 5.7 with hemolysis, EKG was checked without concerning findings, renal function WNL, suspect this is due to hemolyzation as opposed to true hyperkalemia.  No other significant electrolyte derangement.  Her bicarb is low this may be due to her hyperventilation, anion gap is normal- PCP recheck  Patient received ODT Zofran in triage without relief.  We will start IV, give a liter of fluids, and administer ativan as well as pepcid.    1300: RE-EVAL: Patient is sleeping, resting comfortably.   14:30: RE-EVAL: Patient tolerating PO, feeling much better, requesting discharge. Repeat abdominal exam w/o peritoneal signs. Do not suspect acute surgical abdomen such as cholecystitis, appendicitis, pancreatitis, obstruction, perforation, PID, ectopic pregnancy.  Unable to provide urine sample, no urinary symptoms, do not suspect UTI.  Timeline seem consistent with alcohol induced emesis.  Given she is feeling better and able to tolerate p.o. she  seems appropriate for discharge.  Discussed decreasing alcohol intake.  I discussed results, treatment plan, need for follow-up, and return precautions with the patient. Provided opportunity for questions, patient confirmed understanding and is in agreement with plan.   Natasha Byrd was evaluated in Emergency Department on 07/25/2019 for the symptoms described in the history of present illness. He/she was evaluated in the context of the global COVID-19 pandemic, which necessitated consideration that the patient might be at risk for infection with the SARS-CoV-2 virus that causes COVID-19. Institutional protocols and algorithms that pertain to the evaluation of patients at risk for COVID-19 are in a state of rapid change based on information released by regulatory bodies including the CDC and federal and state organizations. These policies and algorithms were followed during the patient's care in the ED.    Final Clinical Impression(s) / ED Diagnoses Final diagnoses:  Non-intractable vomiting with nausea, unspecified vomiting type    Rx / DC Orders ED Discharge Orders         Ordered    ondansetron (ZOFRAN ODT) 4 MG disintegrating tablet  Every 8 hours PRN     07/25/19 1436           Loredana Medellin, [place] R, PA-C 07/25/19 1444    Little, Ambrose Finland, MD 07/28/19 0740

## 2019-07-25 NOTE — ED Notes (Signed)
Pt given ginger ale, able to tolerate without vomiting.

## 2019-07-25 NOTE — ED Notes (Signed)
Pt very anxious, stating she cannot breathe, oxygen saturation 100% on room air, pt given cool washrag to apply to face, pt hyperventilating, crying. EDP made aware

## 2020-03-30 ENCOUNTER — Emergency Department (HOSPITAL_COMMUNITY)
Admission: EM | Admit: 2020-03-30 | Discharge: 2020-03-30 | Disposition: A | Discharge status: Home / Self Care | Payer: Self-pay | Attending: Emergency Medicine | Admitting: Emergency Medicine

## 2020-03-30 ENCOUNTER — Encounter (HOSPITAL_COMMUNITY): Payer: Self-pay | Admitting: Emergency Medicine

## 2020-03-30 ENCOUNTER — Other Ambulatory Visit: Payer: Self-pay

## 2020-03-30 DIAGNOSIS — F10129 Alcohol abuse with intoxication, unspecified: Secondary | ICD-10-CM | POA: Insufficient documentation

## 2020-03-30 DIAGNOSIS — Y907 Blood alcohol level of 200-239 mg/100 ml: Secondary | ICD-10-CM | POA: Insufficient documentation

## 2020-03-30 DIAGNOSIS — F10920 Alcohol use, unspecified with intoxication, uncomplicated: Secondary | ICD-10-CM

## 2020-03-30 DIAGNOSIS — R112 Nausea with vomiting, unspecified: Secondary | ICD-10-CM | POA: Insufficient documentation

## 2020-03-30 DIAGNOSIS — F17210 Nicotine dependence, cigarettes, uncomplicated: Secondary | ICD-10-CM | POA: Insufficient documentation

## 2020-03-30 LAB — CBC WITH DIFFERENTIAL/PLATELET
Abs Immature Granulocytes: 0.04 10*3/uL (ref 0.00–0.07)
Basophils Absolute: 0 10*3/uL (ref 0.0–0.1)
Basophils Relative: 0 %
Eosinophils Absolute: 0 10*3/uL (ref 0.0–0.5)
Eosinophils Relative: 0 %
HCT: 37.6 % (ref 36.0–46.0)
Hemoglobin: 12.6 g/dL (ref 12.0–15.0)
Immature Granulocytes: 0 %
Lymphocytes Relative: 14 %
Lymphs Abs: 1.3 10*3/uL (ref 0.7–4.0)
MCH: 31.7 pg (ref 26.0–34.0)
MCHC: 33.5 g/dL (ref 30.0–36.0)
MCV: 94.5 fL (ref 80.0–100.0)
Monocytes Absolute: 0.5 10*3/uL (ref 0.1–1.0)
Monocytes Relative: 5 %
Neutro Abs: 7.5 10*3/uL (ref 1.7–7.7)
Neutrophils Relative %: 81 %
Platelets: 274 10*3/uL (ref 150–400)
RBC: 3.98 MIL/uL (ref 3.87–5.11)
RDW: 12.1 % (ref 11.5–15.5)
WBC: 9.4 10*3/uL (ref 4.0–10.5)
nRBC: 0 % (ref 0.0–0.2)

## 2020-03-30 LAB — COMPREHENSIVE METABOLIC PANEL
ALT: 20 U/L (ref 0–44)
AST: 20 U/L (ref 15–41)
Albumin: 3.9 g/dL (ref 3.5–5.0)
Alkaline Phosphatase: 53 U/L (ref 38–126)
Anion gap: 12 (ref 5–15)
BUN: 14 mg/dL (ref 6–20)
CO2: 22 mmol/L (ref 22–32)
Calcium: 9.2 mg/dL (ref 8.9–10.3)
Chloride: 105 mmol/L (ref 98–111)
Creatinine, Ser: 0.91 mg/dL (ref 0.44–1.00)
GFR calc Af Amer: >60 mL/min (ref >60)
GFR calc non Af Amer: >60 mL/min (ref >60)
Glucose, Bld: 107 mg/dL — ABNORMAL HIGH (ref 70–99)
Potassium: 2.9 mmol/L — ABNORMAL LOW (ref 3.5–5.1)
Sodium: 139 mmol/L (ref 135–145)
Total Bilirubin: 0.2 mg/dL — ABNORMAL LOW (ref 0.3–1.2)
Total Protein: 7.2 g/dL (ref 6.5–8.1)

## 2020-03-30 LAB — SALICYLATE LEVEL: Salicylate Lvl: <7 mg/dL — ABNORMAL LOW (ref 7.0–30.0)

## 2020-03-30 LAB — RAPID URINE DRUG SCREEN, HOSP PERFORMED
Amphetamines: NOT DETECTED
Barbiturates: NOT DETECTED
Benzodiazepines: NOT DETECTED
Cocaine: NOT DETECTED
Opiates: NOT DETECTED
Tetrahydrocannabinol: POSITIVE — AB

## 2020-03-30 LAB — ACETAMINOPHEN LEVEL: Acetaminophen (Tylenol), Serum: <10 ug/mL — ABNORMAL LOW (ref 10–30)

## 2020-03-30 LAB — I-STAT BETA HCG BLOOD, ED (MC, WL, AP ONLY): I-stat hCG, quantitative: <5 m[IU]/mL (ref <5)

## 2020-03-30 LAB — ETHANOL: Alcohol, Ethyl (B): <10 mg/dL (ref <10)

## 2020-03-30 MED ORDER — PROCHLORPERAZINE EDISYLATE 10 MG/2ML IJ SOLN
10.0000 mg | Freq: Once | INTRAMUSCULAR | Status: AC
  Administered 2020-03-30: 10 mg via INTRAVENOUS
  Filled 2020-03-30: qty 2

## 2020-03-30 MED ORDER — DIPHENHYDRAMINE HCL 50 MG/ML IJ SOLN
12.5000 mg | Freq: Once | INTRAMUSCULAR | Status: AC
  Administered 2020-03-30: 12.5 mg via INTRAVENOUS
  Filled 2020-03-30: qty 1

## 2020-03-30 MED ORDER — ONDANSETRON HCL 4 MG/2ML IJ SOLN
4.0000 mg | Freq: Once | INTRAMUSCULAR | Status: AC
  Administered 2020-03-30: 4 mg via INTRAVENOUS
  Filled 2020-03-30: qty 2

## 2020-03-30 MED ORDER — SODIUM CHLORIDE 0.9 % IV BOLUS
1000.0000 mL | Freq: Once | INTRAVENOUS | Status: AC
  Administered 2020-03-30: 1000 mL via INTRAVENOUS

## 2020-03-30 MED ORDER — POTASSIUM CHLORIDE CRYS ER 20 MEQ PO TBCR
40.0000 meq | EXTENDED_RELEASE_TABLET | Freq: Once | ORAL | Status: AC
  Administered 2020-03-30: 40 meq via ORAL
  Filled 2020-03-30: qty 2

## 2020-03-30 MED ORDER — LACTATED RINGERS IV BOLUS
1000.0000 mL | Freq: Once | INTRAVENOUS | Status: AC
  Administered 2020-03-30: 1000 mL via INTRAVENOUS

## 2020-03-30 NOTE — Discharge Instructions (Addendum)
You were evaluated in the Emergency Department and after careful evaluation, we did not find any emergent condition requiring admission or further testing in the hospital.  Your exam/testing today was overall reassuring.  Please return to the Emergency Department if you experience any worsening of your condition.  Thank you for allowing us to be a part of your care.  

## 2020-03-30 NOTE — ED Provider Notes (Signed)
[facility] EMERGENCY DEPARTMENT Provider Note   CSN: [id number] Arrival date & time: 03/30/20  0537     History Chief Complaint  Patient presents with  . Alcohol Intoxication   Low 5 caveat due to intoxication/altered mental status Natasha Byrd is a 34 y.o. female.  The history is provided by the patient.  Alcohol Intoxication This is a new problem. The problem occurs constantly. The problem has not changed since onset.Nothing aggravates the symptoms. Nothing relieves the symptoms.  Patient presents with intoxication and vomiting Patient admits to drinking alcohol last night, reports her friends gave her a pill which she thinks was ecstasy.  She is now having nausea and vomiting.  She reports this was recreational use and was not a suicide attempt     Past Medical History:  Diagnosis Date  . Attention deficit disorder with hyperactivity   . Benzodiazepine abuse (HCC)   . Depression   . Headache   . History of suicidal ideation   . Homicidal ideations    History of,  . UTI (urinary tract infection)     Patient Active Problem List   Diagnosis Date Noted  . Psychoses (HCC)   . Psychosis (HCC) 01/26/2015  . Proteinuria in pregnancy, antepartum 12/19/2012  . Urinary tract infection, site not specified 09/30/2012  . Benzodiazepine abuse, continuous (HCC) 03/30/2012  . Quit smoking 08/17/2008  . ATTENTION DEFICIT, W/HYPERACTIVITY 10/11/2006  . HEADACHE, UNSPECIFIED 10/11/2006    Past Surgical History:  Procedure Laterality Date  . TONSILLECTOMY       OB History    Gravida  2   Para  2   Term  2   Preterm  0   AB  0   Living  2     SAB      TAB      Ectopic      Multiple      Live Births  2           Family History  Problem Relation Age of Onset  . Abnormal EKG Mother   . Hypertension Mother   . Sleep apnea Father   . Hypertension Brother   . Hypertension Maternal Grandmother     Social History   Tobacco Use  .  Smoking status: Current Every Day Smoker    Packs/day: 0.30    Types: Cigarettes  . Smokeless tobacco: Never Used  Substance Use Topics  . Alcohol use: Yes    Alcohol/week: 4.0 standard drinks    Types: 4 Cans of beer per week    Comment: Weekends  . Drug use: No    Types: Marijuana    Home Medications Prior to Admission medications   Medication Sig Start Date End Date Taking? Authorizing Provider  ondansetron (ZOFRAN ODT) 4 MG disintegrating tablet Take 1 tablet (4 mg total) by mouth every 8 (eight) hours as needed for nausea or vomiting. 07/25/19   Petrucelli, Pleas Koch, PA-C    Allergies    Patient has no known allergies.  Review of Systems   Review of Systems  Unable to perform ROS: Mental status change  Constitutional: Negative for fever.  Gastrointestinal: Positive for nausea and vomiting.    Physical Exam Updated Vital Signs BP (!) 117/94 (BP Location: Left Arm)   Pulse (!) 50   Resp 16   SpO2 100%   Physical Exam CONSTITUTIONAL: Disheveled and anxious HEAD: Normocephalic/atraumatic EYES: EOMI/PERRL, no icterus ENMT: Mucous membranes moist NECK: supple no meningeal signs CV:  S1/S2 noted, no murmurs/rubs/gallops noted LUNGS: Lungs are clear to auscultation bilaterally, no apparent distress ABDOMEN: soft, nontender NEURO: Pt is awake/alert/appropriate, moves all extremitiesx4.  No facial droop.   EXTREMITIES:  full ROM SKIN: warm, color normal PSYCH: Anxious  ED Results / Procedures / Treatments   Labs (all labs ordered are listed, but only abnormal results are displayed) Labs Reviewed  CBC WITH DIFFERENTIAL/PLATELET  COMPREHENSIVE METABOLIC PANEL  ACETAMINOPHEN LEVEL  SALICYLATE LEVEL  RAPID URINE DRUG SCREEN, HOSP PERFORMED  ETHANOL  I-STAT BETA HCG BLOOD, ED ([facility], [facility], [facility] ONLY)    EKG EKG Interpretation  Date/Time:  Tuesday March 30 2020 06:39:10 EDT Ventricular Rate:  55 PR Interval:  150 QRS Duration: 86 QT Interval:  474 QTC  Calculation: 453 R Axis:   49 Text Interpretation: Sinus bradycardia Nonspecific T wave abnormality Abnormal ECG Confirmed by Zadie Rhine ([id number]) on 03/30/2020 6:53:07 AM   Radiology No results found.  Procedures Procedures Medications Ordered in ED Medications  ondansetron (ZOFRAN) injection 4 mg (4 mg Intravenous Given 03/30/20 0635)  lactated ringers bolus 1,000 mL (1,000 mLs Intravenous New Bag/Given 03/30/20 0636)    ED Course  I have reviewed the triage vital signs and the nursing notes.  Pertinent labs  results that were available during my care of the patient were reviewed by me and considered in my medical decision making (see chart for details).    MDM Rules/Calculators/A&P                          6:56 AM Patient presents after drinking alcohol and taking ecstasy.  She reports significant nausea and has been dry heaving in the ER.  Vital signs reassuring. Labs are pending at this time. Signed out to Dr. Pilar Plate at shift change Final Clinical Impression(s) / ED Diagnoses Final diagnoses:  None    Rx / DC Orders ED Discharge Orders    None       Zadie Rhine, MD 03/30/20 [phone]

## 2020-03-30 NOTE — ED Notes (Signed)
Pt moved to gurney in hall, RN encouraged pt to relax however she was putting her finger in her throat so that she could throw up.

## 2020-03-30 NOTE — ED Provider Notes (Signed)
  Provider Note MRN:  [id number]  Arrival date & time: 03/30/20    ED Course and Medical Decision Making  Assumed care from Dr. Bebe Shaggy at shift change.  Alcohol intoxication mixed with suspected ecstasy use, will need time to metabolize and will reassess.  Some continued nausea through the morning but now patient is feeling well, tolerating p.o., appropriate for discharge.  Work-up reassuring.  Procedures  Final Clinical Impressions(s) / ED Diagnoses     ICD-10-CM   1. Alcoholic intoxication without complication (HCC)  P59.163     ED Discharge Orders    None        Discharge Instructions     You were evaluated in the Emergency Department and after careful evaluation, we did not find any emergent condition requiring admission or further testing in the hospital.  Your exam/testing today was overall reassuring.  Please return to the Emergency Department if you experience any worsening of your condition.  Thank you for allowing [place] to be a part of your care.      Elmer Sow. Pilar Plate, MD [facility] Emergency Medicine [facility] [email or web address]    Sabas Sous, MD 03/30/20 1120

## 2020-03-30 NOTE — ED Triage Notes (Addendum)
Patient is from home, copious amounts of ETOH and percocet and maybe some ectascy on board.  Patient having some dry heaves upon arrival to ED.  GCS of 14.  VSS.  CBG of 122.  Patient was given 4mg  IV Zofran.

## 2020-08-11 ENCOUNTER — Other Ambulatory Visit: Payer: Self-pay

## 2020-08-11 ENCOUNTER — Emergency Department (HOSPITAL_COMMUNITY)
Admission: EM | Admit: 2020-08-11 | Discharge: 2020-08-11 | Disposition: A | Discharge status: Home / Self Care | Payer: Medicaid Other | Attending: Emergency Medicine | Admitting: Emergency Medicine

## 2020-08-11 ENCOUNTER — Emergency Department (HOSPITAL_COMMUNITY): Payer: Medicaid Other

## 2020-08-11 ENCOUNTER — Encounter (HOSPITAL_COMMUNITY): Payer: Self-pay

## 2020-08-11 DIAGNOSIS — R52 Pain, unspecified: Secondary | ICD-10-CM | POA: Insufficient documentation

## 2020-08-11 DIAGNOSIS — R0602 Shortness of breath: Secondary | ICD-10-CM | POA: Insufficient documentation

## 2020-08-11 DIAGNOSIS — Z5321 Procedure and treatment not carried out due to patient leaving prior to being seen by health care provider: Secondary | ICD-10-CM | POA: Insufficient documentation

## 2020-08-11 LAB — CBC
HCT: 38.1 % (ref 36.0–46.0)
Hemoglobin: 12.9 g/dL (ref 12.0–15.0)
MCH: 32.8 pg (ref 26.0–34.0)
MCHC: 33.9 g/dL (ref 30.0–36.0)
MCV: 96.9 fL (ref 80.0–100.0)
Platelets: 205 10*3/uL (ref 150–400)
RBC: 3.93 MIL/uL (ref 3.87–5.11)
RDW: 11.9 % (ref 11.5–15.5)
WBC: 7.3 10*3/uL (ref 4.0–10.5)
nRBC: 0 % (ref 0.0–0.2)

## 2020-08-11 LAB — BASIC METABOLIC PANEL
Anion gap: 10 (ref 5–15)
BUN: 11 mg/dL (ref 6–20)
CO2: 22 mmol/L (ref 22–32)
Calcium: 9.7 mg/dL (ref 8.9–10.3)
Chloride: 105 mmol/L (ref 98–111)
Creatinine, Ser: 0.93 mg/dL (ref 0.44–1.00)
GFR, Estimated: >60 mL/min (ref >60)
Glucose, Bld: 89 mg/dL (ref 70–99)
Potassium: 3.4 mmol/L — ABNORMAL LOW (ref 3.5–5.1)
Sodium: 137 mmol/L (ref 135–145)

## 2020-08-11 MED ORDER — ACETAMINOPHEN 325 MG PO TABS
650.0000 mg | ORAL_TABLET | Freq: Once | ORAL | Status: AC | PRN
  Administered 2020-08-11: 650 mg via ORAL
  Filled 2020-08-11: qty 2

## 2020-08-11 NOTE — ED Triage Notes (Signed)
Pt BIB EMS from home. Pt reports generalized body aches and SHOB since last night. Pt denies being vaccinated for COVID.

## 2020-08-11 NOTE — ED Notes (Signed)
Pt informed staff that she was leaving and turning in her stickers.

## 2020-08-12 ENCOUNTER — Ambulatory Visit
Admission: EM | Admit: 2020-08-12 | Discharge: 2020-08-12 | Discharge status: Against Medical Advice | Payer: Medicaid Other

## 2020-12-05 ENCOUNTER — Encounter (HOSPITAL_COMMUNITY): Payer: Self-pay | Admitting: Emergency Medicine

## 2020-12-05 ENCOUNTER — Emergency Department (HOSPITAL_COMMUNITY)
Admission: EM | Admit: 2020-12-05 | Discharge: 2020-12-05 | Disposition: A | Discharge status: Home / Self Care | Payer: Self-pay | Attending: Emergency Medicine | Admitting: Emergency Medicine

## 2020-12-05 ENCOUNTER — Emergency Department (HOSPITAL_COMMUNITY): Payer: Self-pay

## 2020-12-05 DIAGNOSIS — Y900 Blood alcohol level of less than 20 mg/100 ml: Secondary | ICD-10-CM | POA: Insufficient documentation

## 2020-12-05 DIAGNOSIS — F17210 Nicotine dependence, cigarettes, uncomplicated: Secondary | ICD-10-CM | POA: Insufficient documentation

## 2020-12-05 DIAGNOSIS — F10129 Alcohol abuse with intoxication, unspecified: Secondary | ICD-10-CM | POA: Insufficient documentation

## 2020-12-05 DIAGNOSIS — R4182 Altered mental status, unspecified: Secondary | ICD-10-CM | POA: Insufficient documentation

## 2020-12-05 LAB — COMPREHENSIVE METABOLIC PANEL
ALT: 15 U/L (ref 0–44)
AST: 33 U/L (ref 15–41)
Albumin: 3.9 g/dL (ref 3.5–5.0)
Alkaline Phosphatase: 39 U/L (ref 38–126)
Anion gap: 8 (ref 5–15)
BUN: 15 mg/dL (ref 6–20)
CO2: 22 mmol/L (ref 22–32)
Calcium: 8.5 mg/dL — ABNORMAL LOW (ref 8.9–10.3)
Chloride: 107 mmol/L (ref 98–111)
Creatinine, Ser: 0.98 mg/dL (ref 0.44–1.00)
GFR, Estimated: >60 mL/min (ref >60)
Glucose, Bld: 111 mg/dL — ABNORMAL HIGH (ref 70–99)
Potassium: 4.5 mmol/L (ref 3.5–5.1)
Sodium: 137 mmol/L (ref 135–145)
Total Bilirubin: 0.9 mg/dL (ref 0.3–1.2)
Total Protein: 7.2 g/dL (ref 6.5–8.1)

## 2020-12-05 LAB — I-STAT BETA HCG BLOOD, ED (MC, WL, AP ONLY): I-stat hCG, quantitative: <5 m[IU]/mL (ref <5)

## 2020-12-05 LAB — CBC
HCT: 37 % (ref 36.0–46.0)
Hemoglobin: 12.6 g/dL (ref 12.0–15.0)
MCH: 32.7 pg (ref 26.0–34.0)
MCHC: 34.1 g/dL (ref 30.0–36.0)
MCV: 96.1 fL (ref 80.0–100.0)
Platelets: 239 10*3/uL (ref 150–400)
RBC: 3.85 MIL/uL — ABNORMAL LOW (ref 3.87–5.11)
RDW: 12.1 % (ref 11.5–15.5)
WBC: 7.8 10*3/uL (ref 4.0–10.5)
nRBC: 0 % (ref 0.0–0.2)

## 2020-12-05 LAB — ETHANOL: Alcohol, Ethyl (B): <10 mg/dL (ref <10)

## 2020-12-05 LAB — SALICYLATE LEVEL: Salicylate Lvl: <7 mg/dL — ABNORMAL LOW (ref 7.0–30.0)

## 2020-12-05 LAB — ACETAMINOPHEN LEVEL: Acetaminophen (Tylenol), Serum: <10 ug/mL — ABNORMAL LOW (ref 10–30)

## 2020-12-05 NOTE — Discharge Instructions (Addendum)
Please return for any problem.  °
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
## 2020-12-05 NOTE — ED Notes (Signed)
Purewick placed on patient in order to collect urine specimen.
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
## 2020-12-08 ENCOUNTER — Other Ambulatory Visit: Payer: Self-pay

## 2020-12-08 ENCOUNTER — Emergency Department (HOSPITAL_COMMUNITY)
Admission: EM | Admit: 2020-12-08 | Discharge: 2020-12-08 | Disposition: A | Discharge status: Home / Self Care | Payer: Medicaid Other | Attending: Emergency Medicine | Admitting: Emergency Medicine

## 2020-12-08 ENCOUNTER — Encounter (HOSPITAL_COMMUNITY): Payer: Self-pay | Admitting: Student

## 2020-12-08 DIAGNOSIS — R112 Nausea with vomiting, unspecified: Secondary | ICD-10-CM | POA: Insufficient documentation

## 2020-12-08 DIAGNOSIS — R109 Unspecified abdominal pain: Secondary | ICD-10-CM | POA: Insufficient documentation

## 2020-12-08 DIAGNOSIS — Z5321 Procedure and treatment not carried out due to patient leaving prior to being seen by health care provider: Secondary | ICD-10-CM | POA: Insufficient documentation

## 2020-12-08 DIAGNOSIS — R079 Chest pain, unspecified: Secondary | ICD-10-CM | POA: Insufficient documentation

## 2020-12-08 LAB — URINALYSIS, ROUTINE W REFLEX MICROSCOPIC
Bilirubin Urine: NEGATIVE
Glucose, UA: NEGATIVE mg/dL
Hgb urine dipstick: NEGATIVE
Ketones, ur: NEGATIVE mg/dL
Leukocytes,Ua: NEGATIVE
Nitrite: NEGATIVE
Protein, ur: NEGATIVE mg/dL
Specific Gravity, Urine: 1.014 (ref 1.005–1.030)
pH: 9 — ABNORMAL HIGH (ref 5.0–8.0)

## 2020-12-08 LAB — CBC WITH DIFFERENTIAL/PLATELET
Abs Immature Granulocytes: 0.02 10*3/uL (ref 0.00–0.07)
Basophils Absolute: 0 10*3/uL (ref 0.0–0.1)
Basophils Relative: 0 %
Eosinophils Absolute: 0 10*3/uL (ref 0.0–0.5)
Eosinophils Relative: 1 %
HCT: 37.5 % (ref 36.0–46.0)
Hemoglobin: 12.6 g/dL (ref 12.0–15.0)
Immature Granulocytes: 0 %
Lymphocytes Relative: 23 %
Lymphs Abs: 1.7 10*3/uL (ref 0.7–4.0)
MCH: 32.3 pg (ref 26.0–34.0)
MCHC: 33.6 g/dL (ref 30.0–36.0)
MCV: 96.2 fL (ref 80.0–100.0)
Monocytes Absolute: 0.7 10*3/uL (ref 0.1–1.0)
Monocytes Relative: 9 %
Neutro Abs: 5 10*3/uL (ref 1.7–7.7)
Neutrophils Relative %: 67 %
Platelets: 256 10*3/uL (ref 150–400)
RBC: 3.9 MIL/uL (ref 3.87–5.11)
RDW: 12.2 % (ref 11.5–15.5)
WBC: 7.4 10*3/uL (ref 4.0–10.5)
nRBC: 0 % (ref 0.0–0.2)

## 2020-12-08 LAB — COMPREHENSIVE METABOLIC PANEL
ALT: 16 U/L (ref 0–44)
AST: 20 U/L (ref 15–41)
Albumin: 4 g/dL (ref 3.5–5.0)
Alkaline Phosphatase: 41 U/L (ref 38–126)
Anion gap: 10 (ref 5–15)
BUN: 9 mg/dL (ref 6–20)
CO2: 24 mmol/L (ref 22–32)
Calcium: 9.1 mg/dL (ref 8.9–10.3)
Chloride: 103 mmol/L (ref 98–111)
Creatinine, Ser: 0.86 mg/dL (ref 0.44–1.00)
GFR, Estimated: >60 mL/min (ref >60)
Glucose, Bld: 90 mg/dL (ref 70–99)
Potassium: 3.4 mmol/L — ABNORMAL LOW (ref 3.5–5.1)
Sodium: 137 mmol/L (ref 135–145)
Total Bilirubin: 0.7 mg/dL (ref 0.3–1.2)
Total Protein: 7.3 g/dL (ref 6.5–8.1)

## 2020-12-08 LAB — I-STAT BETA HCG BLOOD, ED (MC, WL, AP ONLY): I-stat hCG, quantitative: <5 m[IU]/mL (ref <5)

## 2020-12-08 LAB — LIPASE, BLOOD: Lipase: 26 U/L (ref 11–51)

## 2020-12-08 MED ORDER — ONDANSETRON 4 MG PO TBDP
4.0000 mg | ORAL_TABLET | Freq: Once | ORAL | Status: AC
  Administered 2020-12-08: 4 mg via ORAL
  Filled 2020-12-08: qty 1

## 2020-12-08 NOTE — ED Provider Notes (Signed)
  Emergency Medicine Provider in Triage Note   MSE was initiated and I personally evaluated the patient  2:52 AM on December 08, 2020 as provider in triage.   Chief Complaint: N/V  HPI  Patient is a 35 y.o. who presets to the ED with complaints of N/V that started @ 1900. 10+ episodes of non bloody emesis. States she gets sweaty with the vomiting. Is having some pain to right breast lump- has had this intermittently x 5 months and the vomiting seemed to aggravate it.    Review of Systems  Positive: Nausea, vomiting, painful breast lump Negative: Hematemesis, abdominal pain, chest pain, diarrhea, dysuria, dyspnea  Physical Exam  BP 125/82   Pulse 78   Temp 98.2 F (36.8 C) (Oral)   Resp 16   SpO2 100%    Gen:   Awake, no distress   HEENT:  Atraumatic  Resp:  Normal effort  Cardiac:  Normal rate  Abd:   Nondistended, no focal tenderness.  MSK:   Moves extremities without difficulty  Neuro:  Speech clear   Medical Decision Making   Initiation of care has begun. The patient has been counseled on the process, plan, and necessity for staying for the completion/evaluation, informed that the remainder of the evaluation will be completed by another provider, this initial triage assessment does not replace that evaluation, and the importance of remaining in the ED until their evaluation is complete.   Clinical Impression  Nausea/vomiting.         Desmond Lope 12/08/20 0309    Shon Baton, MD 12/08/20 [phone]

## 2020-12-08 NOTE — ED Notes (Signed)
Pt name called 3x to be roomed, no response

## 2020-12-08 NOTE — ED Triage Notes (Signed)
Pt c/o nausea, vomiting, and Right chest pain. Pt states she has a lump on her right breast where the pain is. Pt denies diarrhea, constipation, or shortness of breath.

## 2021-03-27 ENCOUNTER — Emergency Department (HOSPITAL_COMMUNITY)
Admission: EM | Admit: 2021-03-27 | Discharge: 2021-03-27 | Disposition: A | Discharge status: Home / Self Care | Payer: Self-pay | Attending: Emergency Medicine | Admitting: Emergency Medicine

## 2021-03-27 ENCOUNTER — Encounter (HOSPITAL_COMMUNITY): Payer: Self-pay

## 2021-03-27 DIAGNOSIS — F19921 Other psychoactive substance use, unspecified with intoxication with delirium: Secondary | ICD-10-CM | POA: Insufficient documentation

## 2021-03-27 DIAGNOSIS — Y900 Blood alcohol level of less than 20 mg/100 ml: Secondary | ICD-10-CM | POA: Insufficient documentation

## 2021-03-27 DIAGNOSIS — F1910 Other psychoactive substance abuse, uncomplicated: Secondary | ICD-10-CM

## 2021-03-27 LAB — CBC WITH DIFFERENTIAL/PLATELET
Abs Immature Granulocytes: 0.02 10*3/uL (ref 0.00–0.07)
Basophils Absolute: 0 10*3/uL (ref 0.0–0.1)
Basophils Relative: 0 %
Eosinophils Absolute: 0 10*3/uL (ref 0.0–0.5)
Eosinophils Relative: 0 %
HCT: 39.8 % (ref 36.0–46.0)
Hemoglobin: 13.9 g/dL (ref 12.0–15.0)
Immature Granulocytes: 0 %
Lymphocytes Relative: 9 %
Lymphs Abs: 0.7 10*3/uL (ref 0.7–4.0)
MCH: 32.6 pg (ref 26.0–34.0)
MCHC: 34.9 g/dL (ref 30.0–36.0)
MCV: 93.4 fL (ref 80.0–100.0)
Monocytes Absolute: 0.3 10*3/uL (ref 0.1–1.0)
Monocytes Relative: 4 %
Neutro Abs: 7.5 10*3/uL (ref 1.7–7.7)
Neutrophils Relative %: 87 %
Platelets: 292 10*3/uL (ref 150–400)
RBC: 4.26 MIL/uL (ref 3.87–5.11)
RDW: 11.7 % (ref 11.5–15.5)
WBC: 8.6 10*3/uL (ref 4.0–10.5)
nRBC: 0 % (ref 0.0–0.2)

## 2021-03-27 LAB — COMPREHENSIVE METABOLIC PANEL
ALT: 18 U/L (ref 0–44)
AST: 26 U/L (ref 15–41)
Albumin: 4.8 g/dL (ref 3.5–5.0)
Alkaline Phosphatase: 50 U/L (ref 38–126)
Anion gap: 13 (ref 5–15)
BUN: 14 mg/dL (ref 6–20)
CO2: 24 mmol/L (ref 22–32)
Calcium: 9.8 mg/dL (ref 8.9–10.3)
Chloride: 104 mmol/L (ref 98–111)
Creatinine, Ser: 0.78 mg/dL (ref 0.44–1.00)
GFR, Estimated: >60 mL/min (ref >60)
Glucose, Bld: 107 mg/dL — ABNORMAL HIGH (ref 70–99)
Potassium: 3.3 mmol/L — ABNORMAL LOW (ref 3.5–5.1)
Sodium: 141 mmol/L (ref 135–145)
Total Bilirubin: 0.5 mg/dL (ref 0.3–1.2)
Total Protein: 8.6 g/dL — ABNORMAL HIGH (ref 6.5–8.1)

## 2021-03-27 LAB — HCG, QUANTITATIVE, PREGNANCY: hCG, Beta Chain, Quant, S: <1 m[IU]/mL (ref <5)

## 2021-03-27 LAB — ETHANOL: Alcohol, Ethyl (B): <10 mg/dL (ref <10)

## 2021-03-27 LAB — SALICYLATE LEVEL: Salicylate Lvl: <7 mg/dL — ABNORMAL LOW (ref 7.0–30.0)

## 2021-03-27 LAB — ACETAMINOPHEN LEVEL: Acetaminophen (Tylenol), Serum: <10 ug/mL — ABNORMAL LOW (ref 10–30)

## 2021-03-27 MED ORDER — SODIUM CHLORIDE 0.9 % IV BOLUS
1000.0000 mL | Freq: Once | INTRAVENOUS | Status: AC
  Administered 2021-03-27: 1000 mL via INTRAVENOUS

## 2021-03-27 MED ORDER — ONDANSETRON 8 MG PO TBDP
8.0000 mg | ORAL_TABLET | Freq: Once | ORAL | Status: AC
  Administered 2021-03-27: 8 mg via ORAL
  Filled 2021-03-27: qty 1

## 2021-03-27 MED ORDER — LORAZEPAM 2 MG/ML IJ SOLN
2.0000 mg | Freq: Once | INTRAMUSCULAR | Status: AC
  Administered 2021-03-27: 2 mg via INTRAMUSCULAR
  Filled 2021-03-27: qty 1
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
## 2021-03-27 NOTE — ED Notes (Signed)
Pt is not compliant to get vital signs.

## 2021-03-27 NOTE — ED Notes (Signed)
An After Visit Summary was printed and given to the patient. Discharge instructions given and no further questions at this time.  

## 2021-03-27 NOTE — ED Notes (Signed)
Pt is altered. Pt mother states this is not her baseline. Pt is hallucinating, grabbing at the air, pt not speaking to staff. Dr. Wallace Cullens aware.

## 2021-03-27 NOTE — ED Notes (Signed)
Pt A&Ox4. Pt able to leave with mother. Dr. Wallace Cullens bedside.
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
## 2021-03-28 ENCOUNTER — Encounter (HOSPITAL_COMMUNITY): Payer: Self-pay | Admitting: Student

## 2021-05-20 ENCOUNTER — Emergency Department (HOSPITAL_COMMUNITY)
Admission: EM | Admit: 2021-05-20 | Discharge: 2021-05-20 | Disposition: A | Discharge status: Home / Self Care | Payer: Self-pay | Attending: Emergency Medicine | Admitting: Emergency Medicine

## 2021-05-20 ENCOUNTER — Emergency Department (HOSPITAL_COMMUNITY): Payer: Self-pay

## 2021-05-20 DIAGNOSIS — T402X1A Poisoning by other opioids, accidental (unintentional), initial encounter: Secondary | ICD-10-CM | POA: Insufficient documentation

## 2021-05-20 DIAGNOSIS — X58XXXA Exposure to other specified factors, initial encounter: Secondary | ICD-10-CM | POA: Insufficient documentation

## 2021-05-20 DIAGNOSIS — R072 Precordial pain: Secondary | ICD-10-CM | POA: Insufficient documentation

## 2021-05-20 DIAGNOSIS — N9489 Other specified conditions associated with female genital organs and menstrual cycle: Secondary | ICD-10-CM | POA: Insufficient documentation

## 2021-05-20 DIAGNOSIS — T6591XA Toxic effect of unspecified substance, accidental (unintentional), initial encounter: Secondary | ICD-10-CM

## 2021-05-20 LAB — MAGNESIUM: Magnesium: 1.9 mg/dL (ref 1.7–2.4)

## 2021-05-20 LAB — RAPID URINE DRUG SCREEN, HOSP PERFORMED
Amphetamines: NOT DETECTED
Barbiturates: NOT DETECTED
Benzodiazepines: NOT DETECTED
Cocaine: POSITIVE — AB
Opiates: NOT DETECTED
Tetrahydrocannabinol: POSITIVE — AB

## 2021-05-20 LAB — CBC
HCT: 41 % (ref 36.0–46.0)
Hemoglobin: 14.1 g/dL (ref 12.0–15.0)
MCH: 32 pg (ref 26.0–34.0)
MCHC: 34.4 g/dL (ref 30.0–36.0)
MCV: 93.2 fL (ref 80.0–100.0)
Platelets: 338 10*3/uL (ref 150–400)
RBC: 4.4 MIL/uL (ref 3.87–5.11)
RDW: 11.9 % (ref 11.5–15.5)
WBC: 8.9 10*3/uL (ref 4.0–10.5)
nRBC: 0 % (ref 0.0–0.2)

## 2021-05-20 LAB — I-STAT BETA HCG BLOOD, ED (MC, WL, AP ONLY): I-stat hCG, quantitative: <5 m[IU]/mL (ref <5)

## 2021-05-20 LAB — COMPREHENSIVE METABOLIC PANEL
ALT: 19 U/L (ref 0–44)
AST: 29 U/L (ref 15–41)
Albumin: 4.8 g/dL (ref 3.5–5.0)
Alkaline Phosphatase: 51 U/L (ref 38–126)
Anion gap: 13 (ref 5–15)
BUN: 12 mg/dL (ref 6–20)
CO2: 19 mmol/L — ABNORMAL LOW (ref 22–32)
Calcium: 10 mg/dL (ref 8.9–10.3)
Chloride: 106 mmol/L (ref 98–111)
Creatinine, Ser: 1.01 mg/dL — ABNORMAL HIGH (ref 0.44–1.00)
GFR, Estimated: >60 mL/min (ref >60)
Glucose, Bld: 103 mg/dL — ABNORMAL HIGH (ref 70–99)
Potassium: 3.2 mmol/L — ABNORMAL LOW (ref 3.5–5.1)
Sodium: 138 mmol/L (ref 135–145)
Total Bilirubin: 1 mg/dL (ref 0.3–1.2)
Total Protein: 8.5 g/dL — ABNORMAL HIGH (ref 6.5–8.1)

## 2021-05-20 LAB — ACETAMINOPHEN LEVEL: Acetaminophen (Tylenol), Serum: <10 ug/mL — ABNORMAL LOW (ref 10–30)

## 2021-05-20 LAB — TROPONIN I (HIGH SENSITIVITY)
Troponin I (High Sensitivity): 2 ng/L (ref <18)
Troponin I (High Sensitivity): <2 ng/L (ref <18)

## 2021-05-20 LAB — SALICYLATE LEVEL: Salicylate Lvl: <7 mg/dL — ABNORMAL LOW (ref 7.0–30.0)

## 2021-05-20 LAB — ETHANOL: Alcohol, Ethyl (B): <10 mg/dL (ref <10)

## 2021-05-20 MED ORDER — LORAZEPAM 2 MG/ML IJ SOLN
1.0000 mg | Freq: Once | INTRAMUSCULAR | Status: AC
  Administered 2021-05-20: 1 mg via INTRAVENOUS

## 2021-05-20 MED ORDER — POTASSIUM CHLORIDE ER 10 MEQ PO TBCR: 10.0000 meq | EXTENDED_RELEASE_TABLET | Freq: Every day | ORAL | 0 refills | Status: DC

## 2021-05-20 MED ORDER — LORAZEPAM 2 MG/ML IJ SOLN
1.0000 mg | Freq: Once | INTRAMUSCULAR | Status: AC
  Administered 2021-05-20: 1 mg via INTRAMUSCULAR
  Filled 2021-05-20: qty 1

## 2021-05-20 MED ORDER — POTASSIUM CHLORIDE CRYS ER 20 MEQ PO TBCR
40.0000 meq | EXTENDED_RELEASE_TABLET | Freq: Once | ORAL | Status: AC
  Administered 2021-05-20: 40 meq via ORAL
  Filled 2021-05-20: qty 2

## 2021-05-20 MED ORDER — SODIUM CHLORIDE 0.9 % IV BOLUS
1000.0000 mL | Freq: Once | INTRAVENOUS | Status: AC
  Administered 2021-05-20: 1000 mL via INTRAVENOUS

## 2021-05-20 MED ORDER — DIPHENHYDRAMINE HCL 50 MG/ML IJ SOLN: 50.0000 mg | Freq: Once | INTRAMUSCULAR | Status: DC

## 2021-05-20 NOTE — Discharge Instructions (Addendum)
You came to the emergency department today to be evaluated for your chest pain and cramping after ingesting unknown pills and cocaine last night.  Your physical exam and lab work were reassuring.  Your potassium was found to be low.  You were given potassium in the emergency department.  Please pick up prescription for potassium and follow-up with primary care provider or urgent care to recheck potassium level once medication is completed.  Please see attached paperwork for substance use and counseling.  Please one of these groups for help with your substance use.  Get help right away if: Your chest pain gets worse. You have a cough that gets worse, or you cough up blood. You have severe pain in your abdomen. You faint. You have sudden, unexplained chest discomfort. You have sudden, unexplained discomfort in your arms, back, neck, or jaw. You have shortness of breath at any time. You suddenly start to sweat, or your skin gets clammy. You feel nausea or you vomit. You suddenly feel lightheaded or dizzy. You have severe weakness, or unexplained weakness or fatigue. Your heart begins to beat quickly, or it feels like it is skipping beats.

## 2021-05-20 NOTE — ED Provider Notes (Signed)
[facility] EMERGENCY DEPARTMENT Provider Note   CSN: [id number] Arrival date & time: 05/20/21  1059     History Chief Complaint  Patient presents with   Ingestion    Natasha Byrd is a 35 y.o. female with a history of ADHD, suicidal ideations, depression, benzodiazepine abuse.  Presents the emergency department with a chief complaint of drug ingestion.  Patient is rolling on hospital bed screaming "mama help me."  Patient does not answer questions to orient to self, place, or time.  When asked if anything is hurting or bothering her patient states "my chest."    Patient's mother at bedside reports that she was contacted by one of the patient's friends at 0 300 who stated "took Percocet and some pills."  Patient's mother advised that she can hear patient moaning and calling for help in the background much like she is now.  Patient's friends reported that patient had been like this since 2300.  Patient's mother noticed that she uses cocaine but does not know any other drugs patient may use.   Ingestion      Past Medical History:  Diagnosis Date   Attention deficit disorder with hyperactivity    Benzodiazepine abuse (HCC)    Depression    Headache    History of suicidal ideation    Homicidal ideations    History of,   UTI (urinary tract infection)     Patient Active Problem List   Diagnosis Date Noted   Psychoses (HCC)    Psychosis (HCC) 01/26/2015   Proteinuria in pregnancy, antepartum 12/19/2012   Urinary tract infection, site not specified 09/30/2012   Benzodiazepine abuse, continuous (HCC) 03/30/2012   Quit smoking 08/17/2008   ATTENTION DEFICIT, W/HYPERACTIVITY 10/11/2006   HEADACHE, UNSPECIFIED 10/11/2006    Past Surgical History:  Procedure Laterality Date   TONSILLECTOMY       OB History     Gravida  2   Para  2   Term  2   Preterm  0   AB  0   Living  2      SAB  0   IAB  0   Ectopic  0   Multiple      Live Births   2           Family History  Problem Relation Age of Onset   Abnormal EKG Mother    Hypertension Mother    Sleep apnea Father    Hypertension Brother    Hypertension Maternal Grandmother     Social History   Tobacco Use   Smokeless tobacco: Never  Vaping Use   Vaping Use: Never used  Substance Use Topics   Alcohol use: Yes    Alcohol/week: 4.0 standard drinks    Types: 4 Cans of beer per week    Comment: Weekends   Drug use: No    Types: Marijuana    Comment: cocaine, benzos    Home Medications Prior to Admission medications   Not on File    Allergies    Patient has no known allergies.  Review of Systems   Review of Systems  Unable to perform ROS: Mental status change   Physical Exam Updated Vital Signs BP (!) 116/92 (BP Location: Right Arm)   Pulse (!) 113   Temp 98.8 F (37.1 C) (Oral)   Resp (!) 34   Ht 5\' 8"  (1.727 m)   Wt 72.6 kg   SpO2 97%   BMI 24.33 kg/m  Physical Exam Vitals and nursing note reviewed.  Constitutional:      General: She is not in acute distress.    Appearance: She is not ill-appearing, toxic-appearing or diaphoretic.  HENT:     Head: Normocephalic and atraumatic. No raccoon eyes, Battle's sign, abrasion, contusion, masses, right periorbital erythema, left periorbital erythema or laceration.  Eyes:     General: No scleral icterus.       Right eye: No discharge.        Left eye: No discharge.     Conjunctiva/sclera: Conjunctivae normal.     Pupils: Pupils are equal, round, and reactive to light.     Comments: Pupils 4 mm bilaterally  Cardiovascular:     Rate and Rhythm: Normal rate.     Pulses:          Radial pulses are 2+ on the right side and 2+ on the left side.  Pulmonary:     Effort: Pulmonary effort is normal. No respiratory distress.     Breath sounds: Normal breath sounds.  Abdominal:     General: There is no distension. There are no signs of injury.     Palpations: Abdomen is soft. There is no mass or  pulsatile mass.     Tenderness: There is no abdominal tenderness. There is no guarding or rebound.  Musculoskeletal:     Right hand: No swelling, deformity, lacerations, tenderness or bony tenderness. Normal capillary refill.     Left hand: No swelling, deformity, lacerations, tenderness or bony tenderness. Normal capillary refill.     Comments: Patient noted to have cramping to bilateral hands.  Skin:    General: Skin is warm and dry.  Neurological:     General: No focal deficit present.     Mental Status: She is alert.     GCS: GCS eye subscore is 4. GCS verbal subscore is 4. GCS motor subscore is 6.  Psychiatric:        Behavior: Behavior is uncooperative, agitated and hyperactive.    ED Results / Procedures / Treatments   Labs (all labs ordered are listed, but only abnormal results are displayed) Labs Reviewed  COMPREHENSIVE METABOLIC PANEL - Abnormal; Notable for the following components:      Result Value   Potassium 3.2 (*)    CO2 19 (*)    Glucose, Bld 103 (*)    Creatinine, Ser 1.01 (*)    Total Protein 8.5 (*)    All other components within normal limits  SALICYLATE LEVEL - Abnormal; Notable for the following components:   Salicylate Lvl <7.0 (*)    All other components within normal limits  ACETAMINOPHEN LEVEL - Abnormal; Notable for the following components:   Acetaminophen (Tylenol), Serum <10 (*)    All other components within normal limits  RAPID URINE DRUG SCREEN, HOSP PERFORMED - Abnormal; Notable for the following components:   Cocaine POSITIVE (*)    Tetrahydrocannabinol POSITIVE (*)    All other components within normal limits  ETHANOL  CBC  MAGNESIUM  I-STAT BETA HCG BLOOD, ED ([facility], [facility], [facility] ONLY)  CBG MONITORING, ED  TROPONIN I (HIGH SENSITIVITY)  TROPONIN I (HIGH SENSITIVITY)    EKG EKG Interpretation  Date/Time:  Friday May 20 2021 11:29:52 EDT Ventricular Rate:  65 PR Interval:  134 QRS Duration: 78 QT Interval:  550 QTC  Calculation: 572 R Axis:   57 Text Interpretation: Sinus rhythm QT prolonged Nonspecific T wave abnormality Confirmed by Cathren Laine ([id number]) on 05/20/2021  12:39:21 PM  Radiology DG Chest 2 View  Result Date: 05/20/2021 CLINICAL DATA:  Shortness of breath. EXAM: CHEST - 2 VIEW COMPARISON:  08/11/2020 FINDINGS: The cardiac silhouette, mediastinal and hilar contours are normal. The lungs are clear. No pleural effusions. No pulmonary lesions. The bony thorax is intact. IMPRESSION: No acute cardiopulmonary findings. Electronically Signed   By: Rudie Meyer M.D.   On: 05/20/2021 14:51    Procedures Procedures   Medications Ordered in ED Medications  LORazepam (ATIVAN) injection 1 mg (1 mg Intramuscular Given 05/20/21 1145)  LORazepam (ATIVAN) injection 1 mg (1 mg Intravenous Given 05/20/21 1147)  sodium chloride 0.9 % bolus 1,000 mL (0 mLs Intravenous Stopped 05/20/21 1430)  potassium chloride SA (KLOR-CON) CR tablet 40 mEq (40 mEq Oral Given 05/20/21 1439)    ED Course  I have reviewed the triage vital signs and the nursing notes.  Pertinent labs & imaging results that were available during my care of the patient were reviewed by me and considered in my medical decision making (see chart for details).    MDM Rules/Calculators/A&P                           Alert 35 year old female in no acute distress, nontoxic appearing.  Patient appears hyperactive and agitated rolling on hospital bed screaming "mama help me."  Patient does not answer questions to orient to self, place, or time.  Level 5 caveat applies at this time.  Mother at bedside reports that she was told patient took Percocet and "some pills," last night.  Patient complains of chest pain complains of chest pain.  CMP, CBC, acetaminophen, salicylate, ethanol, UDS, EKG, troponin, chest x-ray obtained in triage.  Patient given 2 mg Ativan.  After receiving Ativan patient noted to be sleeping comfortably.  Patient easily arousable.   Upon waking patient alert and oriented x3.  Patient reports that she took "some pills last night."  Patient does not know what kind of pills they were.  Patient also endorses cocaine use.  Denies any alcohol use.  Denies any suicidal ideations, self-harm, visual hallucinations, auditory hallucinations.  Patient reports that her chest pain has been constant since 2300 yesterday evening.  Reports that it "locked up," and she was having pain constantly since then.  No radiation of pain.  EKG shows sinus rhythm Chest x-ray shows no active cardiopulmonary disease Troponin nightly less than 2 Heart score 3 Low suspicion for ACS at this time.  Patient hemodynamically stable at this time.  Is alert and oriented.  Will discharge to be brought home by mother who is at bedside.  Patient given resources for substance use and counseling.  Patient to follow-up with primary care provider for reassessment of potassium  Discussed results, findings, treatment and follow up. Patient advised of return precautions. Patient verbalized understanding and agreed with plan.   Final Clinical Impression(s) / ED Diagnoses Final diagnoses:  Ingestion of substance, accidental or unintentional, initial encounter  Precordial chest pain    Rx / DC Orders ED Discharge Orders          Ordered    potassium chloride (KLOR-CON) 10 MEQ tablet  Daily        05/20/21 1724             Haskel Schroeder, PA-C 05/20/21 1724    Cathren Laine, MD 05/26/21 1711

## 2021-05-20 NOTE — ED Triage Notes (Signed)
Pt arrives via POV from home with nausea/vomting since 11pm last night. Pts friend called her mom at 4am. Pt yelling and screaming. Reportedly took unknown amount of percocet. Unknown SI/Hi. Pt with very anxious appearance.

## 2021-05-20 NOTE — ED Provider Notes (Signed)
Emergency Medicine Provider Triage Evaluation Note  Natasha Byrd , a 35 y.o. female  was evaluated in triage.  Pt complains of ingestion/abnormal behavior.  History is given by the patient's mother.  She states that the patient's friend called her and states that she was taking Percocets and has been acting abnormally.  Patient is currently screaming stating that she needs to lay down and that her chest hurts and she feels short of breath and that she is going to die.  Mother states that this is extremely abnormal for her and she is concerned that she was given some kind of contaminated pill..  Review of Systems  Positive: Chest pain, shortness of breath, abnormal behavior Negative: Leg swelling  Physical Exam  BP (!) 116/92 (BP Location: Right Arm)   Pulse (!) 113   Temp 98.8 F (37.1 C) (Oral)   Resp (!) 34   Ht 5\' 8"  (1.727 m)   Wt 72.6 kg   SpO2 97%   BMI 24.33 kg/m  Gen:   Awake, no distress   Resp:  Normal effort  MSK:   Moves extremities without difficulty  Other:  Crying loudly  Medical Decision Making  Medically screening exam initiated at 11:15 AM.  Appropriate orders placed.  Natasha Byrd was informed that the remainder of the evaluation will be completed by another provider, this initial triage assessment does not replace that evaluation, and the importance of remaining in the ED until their evaluation is complete.  Patient with ingestion, abnormal behavior, chest pain   Jennette Kettle, PA-C 05/20/21 1118    07/20/21, MD 05/20/21 1952

## 2021-06-28 ENCOUNTER — Encounter (HOSPITAL_COMMUNITY): Payer: Self-pay

## 2021-06-28 ENCOUNTER — Emergency Department (HOSPITAL_COMMUNITY)
Admission: EM | Admit: 2021-06-28 | Discharge: 2021-06-28 | Disposition: A | Discharge status: Home / Self Care | Payer: Medicaid Other | Attending: Emergency Medicine | Admitting: Emergency Medicine

## 2021-06-28 ENCOUNTER — Other Ambulatory Visit: Payer: Self-pay

## 2021-06-28 DIAGNOSIS — R1032 Left lower quadrant pain: Secondary | ICD-10-CM | POA: Insufficient documentation

## 2021-06-28 DIAGNOSIS — R1031 Right lower quadrant pain: Secondary | ICD-10-CM | POA: Insufficient documentation

## 2021-06-28 DIAGNOSIS — Z5321 Procedure and treatment not carried out due to patient leaving prior to being seen by health care provider: Secondary | ICD-10-CM | POA: Insufficient documentation

## 2021-06-28 DIAGNOSIS — R112 Nausea with vomiting, unspecified: Secondary | ICD-10-CM | POA: Insufficient documentation

## 2021-06-28 LAB — CBC
HCT: 38 % (ref 36.0–46.0)
Hemoglobin: 12.5 g/dL (ref 12.0–15.0)
MCH: 32.1 pg (ref 26.0–34.0)
MCHC: 32.9 g/dL (ref 30.0–36.0)
MCV: 97.7 fL (ref 80.0–100.0)
Platelets: 254 10*3/uL (ref 150–400)
RBC: 3.89 MIL/uL (ref 3.87–5.11)
RDW: 12 % (ref 11.5–15.5)
WBC: 4.7 10*3/uL (ref 4.0–10.5)
nRBC: 0 % (ref 0.0–0.2)

## 2021-06-28 LAB — COMPREHENSIVE METABOLIC PANEL
ALT: 11 U/L (ref 0–44)
AST: 17 U/L (ref 15–41)
Albumin: 3.8 g/dL (ref 3.5–5.0)
Alkaline Phosphatase: 42 U/L (ref 38–126)
Anion gap: 7 (ref 5–15)
BUN: 9 mg/dL (ref 6–20)
CO2: 24 mmol/L (ref 22–32)
Calcium: 9.1 mg/dL (ref 8.9–10.3)
Chloride: 105 mmol/L (ref 98–111)
Creatinine, Ser: 0.8 mg/dL (ref 0.44–1.00)
GFR, Estimated: >60 mL/min (ref >60)
Glucose, Bld: 93 mg/dL (ref 70–99)
Potassium: 3.2 mmol/L — ABNORMAL LOW (ref 3.5–5.1)
Sodium: 136 mmol/L (ref 135–145)
Total Bilirubin: 0.6 mg/dL (ref 0.3–1.2)
Total Protein: 7.1 g/dL (ref 6.5–8.1)

## 2021-06-28 LAB — I-STAT BETA HCG BLOOD, ED (MC, WL, AP ONLY): I-stat hCG, quantitative: <5 m[IU]/mL (ref <5)

## 2021-06-28 LAB — URINALYSIS, ROUTINE W REFLEX MICROSCOPIC
Bilirubin Urine: NEGATIVE
Glucose, UA: NEGATIVE mg/dL
Hgb urine dipstick: NEGATIVE
Ketones, ur: NEGATIVE mg/dL
Leukocytes,Ua: NEGATIVE
Nitrite: NEGATIVE
Protein, ur: NEGATIVE mg/dL
Specific Gravity, Urine: 1.018 (ref 1.005–1.030)
pH: 7 (ref 5.0–8.0)

## 2021-06-28 LAB — LIPASE, BLOOD: Lipase: 24 U/L (ref 11–51)

## 2021-06-28 NOTE — ED Notes (Signed)
Pt stated she is leaving and going to urgent care

## 2021-06-28 NOTE — ED Triage Notes (Signed)
Pt endorses RLQ/LLQ abdominal pain starting last night with N/V. Pt denies vaginal bleeding/discharge. LMP approximately "middle of last month".

## 2021-06-28 NOTE — ED Provider Notes (Signed)
Emergency Medicine Provider Triage Evaluation Note  Natasha Byrd , a 35 y.o. female  was evaluated in triage.  Pt complains of abdominal pain starting last night.  Patient states that she had abrupt onset of bilateral lower quadrant abdominal pain that caused her to double over.  She initially called EMS, but states that the pain only lasted about 10 minutes before it completely went away.  She had recurrence of pain today, but it is not as bad as it was yesterday.  Last menstrual period was in October, not sure the exact date.  But states that she has not had a period this month.  Review of Systems  Positive: Abdominal pain, increased urinary urgency Negative: Nausea, vomiting, diarrhea, dysuria, vaginal bleeding  Physical Exam  BP 120/79   Pulse 92   Temp 98.8 F (37.1 C) (Oral)   Resp 16   Ht 5\' 8"  (1.727 m)   Wt 86.2 kg   LMP  (Within Weeks)   SpO2 100%   BMI 28.89 kg/m  Gen:   Awake, no distress   Resp:  Normal effort  MSK:   Moves extremities without difficulty  Other:    Medical Decision Making  Medically screening exam initiated at 11:38 AM.  Appropriate orders placed.  Natasha Byrd was informed that the remainder of the evaluation will be completed by another provider, this initial triage assessment does not replace that evaluation, and the importance of remaining in the ED until their evaluation is complete.     Jennette Kettle 06/28/21 1139    06/30/21, MD 07/02/21 1551

## 2021-07-05 ENCOUNTER — Inpatient Hospital Stay (HOSPITAL_COMMUNITY)
Admission: AD | Admit: 2021-07-05 | Discharge: 2021-07-05 | Discharge status: Against Medical Advice | Payer: Self-pay | Attending: Obstetrics & Gynecology | Admitting: Obstetrics & Gynecology

## 2021-07-05 DIAGNOSIS — Z3202 Encounter for pregnancy test, result negative: Secondary | ICD-10-CM

## 2021-07-05 DIAGNOSIS — Z5321 Procedure and treatment not carried out due to patient leaving prior to being seen by health care provider: Secondary | ICD-10-CM

## 2021-07-05 LAB — POCT PREGNANCY, URINE: Preg Test, Ur: NEGATIVE

## 2021-07-05 NOTE — MAU Note (Signed)
Pt refusing to do urine pregnancy test, because her pregnancies never show up that way, they have to do blood.  Will wait to speak with provider.

## 2021-07-05 NOTE — MAU Note (Signed)
Thinks something must be wrong, because she is having symptoms.  Believes she is pregnant, like they told her.

## 2021-07-05 NOTE — MAU Note (Signed)
Came to desk at 1400, "received call, needs to go pick up kids", asking if pee test is accurate, and faster than blood test.  States will do one now. Test was neg- "just upset, thought she was preg"

## 2021-07-05 NOTE — MAU Note (Signed)
Went to the emergency rm last wk. Neg at home.  But never shows in pee. The nurse that drew her blood told her that it was "5 and she was pregnant". Is spotting and cramping a little.

## 2021-10-10 ENCOUNTER — Other Ambulatory Visit: Payer: Self-pay

## 2021-10-10 ENCOUNTER — Encounter (HOSPITAL_COMMUNITY): Payer: Self-pay | Admitting: Emergency Medicine

## 2021-10-10 ENCOUNTER — Emergency Department (HOSPITAL_COMMUNITY)
Admission: EM | Admit: 2021-10-10 | Discharge: 2021-10-11 | Discharge status: Against Medical Advice | Payer: Medicaid Other | Attending: Emergency Medicine | Admitting: Emergency Medicine

## 2021-10-10 ENCOUNTER — Encounter (HOSPITAL_COMMUNITY): Payer: Self-pay

## 2021-10-10 ENCOUNTER — Emergency Department (HOSPITAL_COMMUNITY)
Admission: EM | Admit: 2021-10-10 | Discharge: 2021-10-10 | Discharge status: Against Medical Advice | Payer: Medicaid Other | Attending: Emergency Medicine | Admitting: Emergency Medicine

## 2021-10-10 DIAGNOSIS — R1110 Vomiting, unspecified: Secondary | ICD-10-CM | POA: Insufficient documentation

## 2021-10-10 DIAGNOSIS — E860 Dehydration: Secondary | ICD-10-CM | POA: Insufficient documentation

## 2021-10-10 DIAGNOSIS — E876 Hypokalemia: Secondary | ICD-10-CM | POA: Insufficient documentation

## 2021-10-10 DIAGNOSIS — Z5321 Procedure and treatment not carried out due to patient leaving prior to being seen by health care provider: Secondary | ICD-10-CM | POA: Insufficient documentation

## 2021-10-10 DIAGNOSIS — R112 Nausea with vomiting, unspecified: Secondary | ICD-10-CM

## 2021-10-10 LAB — COMPREHENSIVE METABOLIC PANEL
ALT: 14 U/L (ref 0–44)
AST: 23 U/L (ref 15–41)
Albumin: 4 g/dL (ref 3.5–5.0)
Alkaline Phosphatase: 51 U/L (ref 38–126)
Anion gap: 10 (ref 5–15)
BUN: 9 mg/dL (ref 6–20)
CO2: 23 mmol/L (ref 22–32)
Calcium: 9.5 mg/dL (ref 8.9–10.3)
Chloride: 108 mmol/L (ref 98–111)
Creatinine, Ser: 0.92 mg/dL (ref 0.44–1.00)
GFR, Estimated: >60 mL/min (ref >60)
Glucose, Bld: 121 mg/dL — ABNORMAL HIGH (ref 70–99)
Potassium: 3.1 mmol/L — ABNORMAL LOW (ref 3.5–5.1)
Sodium: 141 mmol/L (ref 135–145)
Total Bilirubin: 0.5 mg/dL (ref 0.3–1.2)
Total Protein: 7.5 g/dL (ref 6.5–8.1)

## 2021-10-10 LAB — CBC
HCT: 41.6 % (ref 36.0–46.0)
Hemoglobin: 13.7 g/dL (ref 12.0–15.0)
MCH: 32.3 pg (ref 26.0–34.0)
MCHC: 32.9 g/dL (ref 30.0–36.0)
MCV: 98.1 fL (ref 80.0–100.0)
Platelets: 352 10*3/uL (ref 150–400)
RBC: 4.24 MIL/uL (ref 3.87–5.11)
RDW: 12 % (ref 11.5–15.5)
WBC: 9.8 10*3/uL (ref 4.0–10.5)
nRBC: 0 % (ref 0.0–0.2)

## 2021-10-10 LAB — LIPASE, BLOOD: Lipase: 23 U/L (ref 11–51)

## 2021-10-10 LAB — I-STAT BETA HCG BLOOD, ED (MC, WL, AP ONLY): I-stat hCG, quantitative: <5 m[IU]/mL (ref <5)

## 2021-10-10 LAB — MAGNESIUM: Magnesium: 1.6 mg/dL — ABNORMAL LOW (ref 1.7–2.4)

## 2021-10-10 LAB — ETHANOL: Alcohol, Ethyl (B): <10 mg/dL (ref <10)

## 2021-10-10 MED ORDER — MAGNESIUM SULFATE 2 GM/50ML IV SOLN: 2.0000 g | Freq: Once | INTRAVENOUS | Status: DC

## 2021-10-10 MED ORDER — POTASSIUM CHLORIDE CRYS ER 20 MEQ PO TBCR: 40.0000 meq | EXTENDED_RELEASE_TABLET | Freq: Once | ORAL | Status: DC

## 2021-10-10 MED ORDER — SODIUM CHLORIDE 0.9 % IV BOLUS: 1000.0000 mL | Freq: Once | INTRAVENOUS | Status: DC

## 2021-10-10 MED ORDER — DROPERIDOL 2.5 MG/ML IJ SOLN
1.2500 mg | Freq: Once | INTRAMUSCULAR | Status: AC
  Administered 2021-10-10: 1.25 mg via INTRAVENOUS
  Filled 2021-10-10: qty 2

## 2021-10-10 MED ORDER — SODIUM CHLORIDE 0.9 % IV BOLUS
1000.0000 mL | Freq: Once | INTRAVENOUS | Status: AC
  Administered 2021-10-10: 1000 mL via INTRAVENOUS

## 2021-10-10 MED ORDER — ONDANSETRON 4 MG PO TBDP
4.0000 mg | ORAL_TABLET | Freq: Once | ORAL | Status: AC | PRN
  Administered 2021-10-10: 4 mg via ORAL
  Filled 2021-10-10: qty 1

## 2021-10-10 NOTE — ED Notes (Signed)
Patient refusing blood work in triage, patient hyperventilating and sliding herself out of the wheelchair. RN and tech pulled pt back up in wheel chair, advised slowing breathing.

## 2021-10-10 NOTE — ED Notes (Signed)
Pt moving unable to get enough blood, tried to restick pt refused

## 2021-10-10 NOTE — ED Provider Notes (Signed)
[facility] EMERGENCY DEPARTMENT Provider Note   CSN: [id number] Arrival date & time: 10/10/21  1031     History  Chief Complaint  Patient presents with   Nausea   Emesis    Natasha Byrd is a 36 y.o. female.  Pt is a 36 yo aa female with pmhx of adhd and depression.  She comes into the ED today complaining of n/v and abd pain.  She thinks she has alcohol poisoning.  She is rolling around on the bed and yelling, so it is difficult to obtain a hx.       Home Medications Prior to Admission medications   Medication Sig Start Date End Date Taking? Authorizing Provider  potassium chloride (KLOR-CON) 10 MEQ tablet Take 1 tablet (10 mEq total) by mouth daily for 5 days. 05/20/21 05/25/21  Haskel Schroeder, PA-C      Allergies    Patient has no known allergies.    Review of Systems   Review of Systems  Gastrointestinal:  Positive for abdominal pain, nausea and vomiting.  All other systems reviewed and are negative.  Physical Exam Updated Vital Signs BP (!) 142/86 (BP Location: Left Arm)    Pulse 80    Temp (!) 97.5 F (36.4 C) (Oral)    Resp 17    SpO2 100%  Physical Exam Vitals and nursing note reviewed.  Constitutional:      Comments: Pt smells strongly of marijuana  HENT:     Head: Normocephalic and atraumatic.     Right Ear: External ear normal.     Left Ear: External ear normal.     Nose: Nose normal.     Mouth/Throat:     Mouth: Mucous membranes are dry.  Eyes:     Extraocular Movements: Extraocular movements intact.     Conjunctiva/sclera: Conjunctivae normal.     Pupils: Pupils are equal, round, and reactive to light.  Cardiovascular:     Rate and Rhythm: Normal rate and regular rhythm.     Pulses: Normal pulses.     Heart sounds: Normal heart sounds.  Pulmonary:     Effort: Pulmonary effort is normal.     Breath sounds: Normal breath sounds.  Abdominal:     General: Abdomen is flat. Bowel sounds are normal.     Palpations:  Abdomen is soft.  Musculoskeletal:        General: Normal range of motion.     Cervical back: Normal range of motion and neck supple.  Skin:    General: Skin is warm.     Capillary Refill: Capillary refill takes less than 2 seconds.  Neurological:     General: No focal deficit present.     Mental Status: She is alert.  Psychiatric:        Mood and Affect: Mood is anxious.        Behavior: Behavior is agitated.    ED Results / Procedures / Treatments   Labs (all labs ordered are listed, but only abnormal results are displayed) Labs Reviewed  COMPREHENSIVE METABOLIC PANEL - Abnormal; Notable for the following components:      Result Value   Potassium 3.1 (*)    Glucose, Bld 121 (*)    All other components within normal limits  MAGNESIUM - Abnormal; Notable for the following components:   Magnesium 1.6 (*)    All other components within normal limits  CBC  LIPASE, BLOOD  ETHANOL  URINALYSIS, ROUTINE W REFLEX MICROSCOPIC  RAPID URINE DRUG SCREEN, HOSP PERFORMED  I-STAT BETA HCG BLOOD, ED ([facility], [facility], [facility] ONLY)    EKG None  Radiology No results found.  Procedures Procedures    Medications Ordered in ED Medications  sodium chloride 0.9 % bolus 1,000 mL (has no administration in time range)  magnesium sulfate IVPB 2 g 50 mL (has no administration in time range)  potassium chloride SA (KLOR-CON M) CR tablet 40 mEq (has no administration in time range)  ondansetron (ZOFRAN-ODT) disintegrating tablet 4 mg (4 mg Oral Given 10/10/21 1041)  sodium chloride 0.9 % bolus 1,000 mL (0 mLs Intravenous Stopped 10/10/21 1414)  droperidol (INAPSINE) 2.5 MG/ML injection 1.25 mg (1.25 mg Intravenous Given 10/10/21 1208)    ED Course/ Medical Decision Making/ A&P                           Medical Decision Making Amount and/or Complexity of Data Reviewed Labs: ordered.  Risk Prescription drug management.   This patient presents to the ED for concern of n/v, this involves an extensive  number of treatment options, and is a complaint that carries with it a high risk of complications and morbidity.  The differential diagnosis includes cannabis hyperemesis, excess alcohol use, infection   Co morbidities that complicate the patient evaluation  adhd   Additional history obtained:  Additional history obtained from epic chart review External records from outside source obtained and reviewed including boyfriend   Lab Tests:  I Ordered, and personally interpreted labs.  The pertinent results include:  hypokalemia, hypomagnesemia, cbc nl, preg neg    Cardiac Monitoring:  The patient was maintained on a cardiac monitor.  I personally viewed and interpreted the cardiac monitored which showed an underlying rhythm of: nsr   Medicines ordered and prescription drug management:  I ordered medication including inapsine  for n/v  Reevaluation of the patient after these medicines showed that the patient improved I have reviewed the patients home medicines and have made adjustments as needed   Problem List / ED Course:  N/v:  I went back to reassess the patient and found the IV on the ground.  Nurse said someone saw her walk out.  I did not get to talk to her before she left. Hypokalemia/hypomagnesemia:  I ordered kcl and mg IV.  However, she left before getting these meds.  Dispostion:  Pt eloped.        Final Clinical Impression(s) / ED Diagnoses Final diagnoses:  Dehydration  Nausea and vomiting, unspecified vomiting type  Hypomagnesemia  Hypokalemia    Rx / DC Orders ED Discharge Orders     None         Jacalyn Lefevre, MD 10/10/21 1425

## 2021-10-10 NOTE — ED Triage Notes (Addendum)
Pt arrived via [facility] from home for c/o N/V all day today. Pt states she thinks she has alcohol poisoning. Pt would not give further info to EMS. When EMS was on scene, pt was hyperventilating. Pt refused to allow EMS to check CBG. Pt actively vomiting in triage.

## 2021-10-10 NOTE — ED Triage Notes (Signed)
Patient coming from home, complaint of possible alcohol poisoning. Patient states she drank more than normal last night. Endorses n/v all morning.

## 2021-10-10 NOTE — ED Notes (Signed)
Pt stated she was dizzy and got out of wheel chair and sat on floor against wheel chair. Then pt started to lay down on floor. Pt was asked to get up and refuses to get up. Pt states she feels better laying on the floor.

## 2021-10-10 NOTE — ED Provider Triage Note (Signed)
Emergency Medicine Provider Triage Evaluation Note  Natasha Byrd , a 36 y.o. female  was evaluated in triage.  Pt complains of intractable vomiting after drinking last night heavily.  Patient was seen and evaluated here this morning had a full work-up and left after she received droperidol.  Returns for similar symptoms.  Review of Systems  Positive:  Negative: See above   Physical Exam  BP 134/74    Pulse 61    Temp 99 F (37.2 C) (Oral)    Resp 16    Ht 5\' 8"  (1.727 m)    Wt 71 kg    SpO2 99%    BMI 23.80 kg/m  Gen:   Awake, no distress   Resp:  Normal effort  MSK:   Moves extremities without difficulty  Other:    Medical Decision Making  Medically screening exam initiated at 6:47 PM.  Appropriate orders placed.  Natasha Byrd was informed that the remainder of the evaluation will be completed by another provider, this initial triage assessment does not replace that evaluation, and the importance of remaining in the ED until their evaluation is complete.     Natasha Byrd [place], [place] 10/10/21 [phone]

## 2021-10-11 NOTE — ED Notes (Signed)
This Clinical research associate went around the lobby getting vitals. Patient not here at this time

## 2022-04-10 ENCOUNTER — Inpatient Hospital Stay (HOSPITAL_COMMUNITY): Payer: Medicaid Other

## 2022-04-10 ENCOUNTER — Inpatient Hospital Stay (HOSPITAL_COMMUNITY)
Admission: AD | Admit: 2022-04-10 | Discharge: 2022-04-10 | Disposition: A | Discharge status: Home / Self Care | Payer: Medicaid Other | Attending: Family Medicine | Admitting: Family Medicine

## 2022-04-10 ENCOUNTER — Encounter (HOSPITAL_COMMUNITY): Payer: Self-pay | Admitting: *Deleted

## 2022-04-10 DIAGNOSIS — O3680X0 Pregnancy with inconclusive fetal viability, not applicable or unspecified: Secondary | ICD-10-CM | POA: Diagnosis not present

## 2022-04-10 DIAGNOSIS — N939 Abnormal uterine and vaginal bleeding, unspecified: Secondary | ICD-10-CM | POA: Insufficient documentation

## 2022-04-10 HISTORY — DX: Chlamydial infection, unspecified: A74.9

## 2022-04-10 LAB — CBC
HCT: 38.6 % (ref 36.0–46.0)
Hemoglobin: 13.1 g/dL (ref 12.0–15.0)
MCH: 32.8 pg (ref 26.0–34.0)
MCHC: 33.9 g/dL (ref 30.0–36.0)
MCV: 96.7 fL (ref 80.0–100.0)
Platelets: 236 10*3/uL (ref 150–400)
RBC: 3.99 MIL/uL (ref 3.87–5.11)
RDW: 11.9 % (ref 11.5–15.5)
WBC: 3.8 10*3/uL — ABNORMAL LOW (ref 4.0–10.5)
nRBC: 0 % (ref 0.0–0.2)

## 2022-04-10 LAB — URINALYSIS, ROUTINE W REFLEX MICROSCOPIC
Bilirubin Urine: NEGATIVE
Glucose, UA: NEGATIVE mg/dL
Ketones, ur: NEGATIVE mg/dL
Nitrite: NEGATIVE
Protein, ur: NEGATIVE mg/dL
Specific Gravity, Urine: 1.014 (ref 1.005–1.030)
Squamous Epithelial / HPF: >50 — ABNORMAL HIGH (ref 0–5)
pH: 7 (ref 5.0–8.0)

## 2022-04-10 LAB — WET PREP, GENITAL
Sperm: NONE SEEN
Trich, Wet Prep: NONE SEEN
WBC, Wet Prep HPF POC: <10 (ref <10)
Yeast Wet Prep HPF POC: NONE SEEN

## 2022-04-10 LAB — HCG, QUANTITATIVE, PREGNANCY: hCG, Beta Chain, Quant, S: 79 m[IU]/mL — ABNORMAL HIGH (ref <5)

## 2022-04-10 LAB — POCT PREGNANCY, URINE: Preg Test, Ur: POSITIVE — AB

## 2022-04-10 NOTE — MAU Note (Signed)
Natasha Byrd is a 35 y.o. at Unknown here in MAU reporting: +HPT yesterday.  Having light pink bleeding that started yesterday. No pain.  LMP: beg of Aug Onset of complaint: yesterday Pain score: none Vitals:   04/10/22 0931  BP: (!) 145/81  Pulse: 83  Resp: 16  Temp: 98.5 F (36.9 C)  SpO2: 100%     Lab orders placed from triage:  UPT, urine if +

## 2022-04-10 NOTE — MAU Note (Signed)
Pt. Self swabbed 

## 2022-04-10 NOTE — MAU Provider Note (Addendum)
History     CSN: [id number]  Arrival date and time: 04/10/22 1324   Event Date/Time   First Provider Initiated Contact with Patient 04/10/22 1009      Chief Complaint  Patient presents with   Vaginal Bleeding   Possible Pregnancy   Natasha Byrd is a 36 y.o. [id number] here for vaginal bleeding and positive home pregnancy test.  Patient is presenting after positive home pregnancy test yesterday, and light pink discharge on wiping after urination. Patient reports last LMP was early at beginning of this month, and has had unprotected sexual intercourse since. See HPI review below:  Vaginal Bleeding The patient's primary symptoms include vaginal bleeding. The patient's pertinent negatives include no genital itching, missed menses, pelvic pain or vaginal discharge. This is a new problem. The current episode started yesterday. The problem occurs intermittently. The problem has been unchanged. The patient is experiencing no pain. Associated symptoms include constipation. Pertinent negatives include no abdominal pain, dysuria, fever, flank pain, frequency or urgency. The vaginal bleeding is lighter than menses. She has not been passing clots. She is sexually active. She uses nothing for contraception. Her menstrual history has been regular.  Possible Pregnancy This is a new problem. The current episode started yesterday. Pertinent negatives include no abdominal pain or fever.    OB History     Gravida  3   Para  2   Term  2   Preterm  0   AB  0   Living  2      SAB  0   IAB  0   Ectopic  0   Multiple      Live Births  2        Obstetric Comments  Hx Pre- eclampsia         Past Medical History:  Diagnosis Date   Attention deficit disorder with hyperactivity    Benzodiazepine abuse (HCC)    Chlamydia    as teen   Depression    doing good now   Headache    History of suicidal ideation    Homicidal ideations    History of,   UTI (urinary tract infection)      Past Surgical History:  Procedure Laterality Date   TONSILLECTOMY      Family History  Problem Relation Age of Onset   Abnormal EKG Mother    Hypertension Mother    Sleep apnea Father    Hypertension Brother    Hypertension Maternal Grandmother     Social History   Tobacco Use   Smoking status: Former    Packs/day: 0.30    Types: Cigarettes   Smokeless tobacco: Never   Tobacco comments:    Quit smoking cig Spring 2023  Vaping Use   Vaping Use: Never used  Substance Use Topics   Alcohol use: Not Currently    Alcohol/week: 4.0 standard drinks of alcohol    Types: 4 Cans of beer per week    Comment: 2 yrs ago   Drug use: Not Currently    Types: Marijuana    Comment: none since incarcerated (~2020)    Allergies: No Known Allergies  Facility-Administered Medications Prior to Admission  Medication Dose Route Frequency Provider Last Rate Last Admin   TDaP (BOOSTRIX) injection 0.5 mL  0.5 mL Intramuscular Once Anyanwu, Ugonna A, MD       Medications Prior to Admission  Medication Sig Dispense Refill Last Dose   potassium chloride (KLOR-CON) 10 MEQ tablet Take 1  tablet (10 mEq total) by mouth daily for 5 days. 5 tablet 0     Review of Systems  Constitutional:  Negative for fever.  Gastrointestinal:  Positive for constipation. Negative for abdominal pain.  Genitourinary:  Positive for vaginal bleeding. Negative for dysuria, flank pain, frequency, missed menses, pelvic pain, urgency and vaginal discharge.   Physical Exam   Blood pressure (!) 145/81, pulse 83, temperature 98.5 F (36.9 C), temperature source Oral, resp. rate 16, height 5\' 5"  (1.651 m), weight 73.5 kg, last menstrual period 03/14/2022, SpO2 100 %.  Physical Exam Constitutional:      Appearance: Normal appearance. She is normal weight.  HENT:     Head: Normocephalic and atraumatic.     Right Ear: External ear normal.     Left Ear: External ear normal.     Nose: Nose normal.     Mouth/Throat:      Mouth: Mucous membranes are moist.  Eyes:     Pupils: Pupils are equal, round, and reactive to light.  Cardiovascular:     Rate and Rhythm: Normal rate and regular rhythm.     Pulses: Normal pulses.     Heart sounds: Normal heart sounds.  Pulmonary:     Effort: Pulmonary effort is normal.     Breath sounds: Normal breath sounds.  Abdominal:     General: Abdomen is flat. There is no distension.     Palpations: Abdomen is soft.     Tenderness: There is no abdominal tenderness.  Skin:    General: Skin is warm and dry.  Neurological:     General: No focal deficit present.     Mental Status: She is alert.  Psychiatric:        Mood and Affect: Mood normal.    Results for orders placed or performed during the hospital encounter of 04/10/22 (from the past 24 hour(s))  Pregnancy, urine POC     Status: Abnormal   Collection Time: 04/10/22  9:47 AM  Result Value Ref Range   Preg Test, Ur POSITIVE (A) NEGATIVE  Urinalysis, Routine w reflex microscopic Urine, Clean Catch     Status: Abnormal   Collection Time: 04/10/22  9:53 AM  Result Value Ref Range   Color, Urine YELLOW YELLOW   APPearance CLOUDY (A) CLEAR   Specific Gravity, Urine 1.014 1.005 - 1.030   pH 7.0 5.0 - 8.0   Glucose, UA NEGATIVE NEGATIVE mg/dL   Hgb urine dipstick SMALL (A) NEGATIVE   Bilirubin Urine NEGATIVE NEGATIVE   Ketones, ur NEGATIVE NEGATIVE mg/dL   Protein, ur NEGATIVE NEGATIVE mg/dL   Nitrite NEGATIVE NEGATIVE   Leukocytes,Ua MODERATE (A) NEGATIVE   RBC / HPF 11-20 0 - 5 RBC/hpf   WBC, UA 6-10 0 - 5 WBC/hpf   Bacteria, UA FEW (A) NONE SEEN   Squamous Epithelial / LPF >50 (H) 0 - 5   Mucus PRESENT    Amorphous Crystal PRESENT   CBC     Status: Abnormal   Collection Time: 04/10/22 10:16 AM  Result Value Ref Range   WBC 3.8 (L) 4.0 - 10.5 K/uL   RBC 3.99 3.87 - 5.11 MIL/uL   Hemoglobin 13.1 12.0 - 15.0 g/dL   HCT 04/12/22 01.0 - 27.2 %   MCV 96.7 80.0 - 100.0 fL   MCH 32.8 26.0 - 34.0 pg   MCHC 33.9  30.0 - 36.0 g/dL   RDW 53.6 64.4 - 03.4 %   Platelets 236 150 - 400 K/uL  nRBC 0.0 0.0 - 0.2 %  hCG, quantitative, pregnancy     Status: Abnormal   Collection Time: 04/10/22 10:16 AM  Result Value Ref Range   hCG, Beta Chain, Quant, S 79 (H) <5 mIU/mL  Wet prep, genital     Status: Abnormal   Collection Time: 04/10/22 10:30 AM   Specimen: PATH Cytology Cervicovaginal Ancillary Only  Result Value Ref Range   Yeast Wet Prep HPF POC NONE SEEN NONE SEEN   Trich, Wet Prep NONE SEEN NONE SEEN   Clue Cells Wet Prep HPF POC PRESENT (A) NONE SEEN   WBC, Wet Prep HPF POC <10 <10   Sperm NONE SEEN    US OB LESS THAN 14 WEEKS WITH OB TRANSVAGINAL  IMPRESSION: Non-localization of the pregnancy on this scan. No intrauterine gestational sac. No abnormal ovarian or adnexal masses. In the setting of positive pregnancy test/serum beta hCG sonographic differential diagnosis includes intrauterine gestation too early to visualize, spontaneous abortion or occult ectopic gestation. Recommend close clinical follow-up and serial serum beta HCG monitoring, with repeat obstetric scan as warranted by beta HCG levels and clinical assessment.  MAU Course  Procedures  MDM Differential includes IUP, ectopic, threatened abortion, STI, UTI, vaginitis. Given positive home pregnancy test here and at home, will evaluate with OB ultrasound, and labs for rule out of infection. Will not type and screen as patient's vitals are stable and known to be A positive.  Wet prep positive for clue cells, will not treat as patient is asymptomatic and does not meet Amsel's criteria.  HCG quant elevated to 79, OB US did not show evidence of ectopic pregnancy or IUP. Therefore, diagnosis of pregnancy of unknown location. Plan to trend [facility] outpatient for evaluation.  Assessment and Plan  Pregnancy, unknown location -Trend HCG outpatient, appointment scheduled for 04/12/22 -Consider further ultrasound as appropriate based on  HCG trend   Celine Mans, MD, PGY-1 04/10/2022, 10:20 AM

## 2022-04-10 NOTE — MAU Provider Note (Signed)
History     CSN: [id number]  Arrival date and time: 04/10/22 2774   Event Date/Time   First Provider Initiated Contact with Patient 04/10/22 1009      Chief Complaint  Patient presents with   Vaginal Bleeding   Possible Pregnancy   36 y.o. [id number] @[redacted]w[redacted]d  by unsure LMP presenting with spotting. Had +HPT yesterday. Spotting started yesterday, states pink and only when she wipes. No recent IC. Denies vaginal itching or malodor. No concern for STIs. Denies abd pain or cramping.     OB History     Gravida  3   Para  2   Term  2   Preterm  0   AB  0   Living  2      SAB  0   IAB  0   Ectopic  0   Multiple      Live Births  2        Obstetric Comments  Hx Pre- eclampsia         Past Medical History:  Diagnosis Date   Attention deficit disorder with hyperactivity    Benzodiazepine abuse (HCC)    Chlamydia    as teen   Depression    doing good now   Headache    History of suicidal ideation    Homicidal ideations    History of,   UTI (urinary tract infection)     Past Surgical History:  Procedure Laterality Date   TONSILLECTOMY      Family History  Problem Relation Age of Onset   Abnormal EKG Mother    Hypertension Mother    Sleep apnea Father    Hypertension Brother    Hypertension Maternal Grandmother     Social History   Tobacco Use   Smoking status: Former    Packs/day: 0.30    Types: Cigarettes   Smokeless tobacco: Never   Tobacco comments:    Quit smoking cig Spring 2023  Vaping Use   Vaping Use: Never used  Substance Use Topics   Alcohol use: Not Currently    Alcohol/week: 4.0 standard drinks of alcohol    Types: 4 Cans of beer per week    Comment: 2 yrs ago   Drug use: Not Currently    Types: Marijuana    Comment: none since incarcerated (~2020)    Allergies: No Known Allergies  No medications prior to admission.    Review of Systems  Gastrointestinal:  Negative for abdominal pain.  Genitourinary:  Positive  for vaginal bleeding.   Physical Exam   Blood pressure (!) 145/81, pulse 83, temperature 98.5 F (36.9 C), temperature source Oral, resp. rate 16, height 5\' 5"  (1.651 m), weight 73.5 kg, last menstrual period 03/14/2022, SpO2 100 %.  Physical Exam Vitals and nursing note reviewed.  Constitutional:      General: She is not in acute distress.    Appearance: Normal appearance.  HENT:     Head: Normocephalic and atraumatic.  Pulmonary:     Effort: Pulmonary effort is normal. No respiratory distress.  Musculoskeletal:        General: Normal range of motion.     Cervical back: Normal range of motion.  Neurological:     General: No focal deficit present.     Mental Status: She is alert and oriented to person, place, and time.  Psychiatric:        Mood and Affect: Mood normal.        Behavior: Behavior normal.  Results for orders placed or performed during the hospital encounter of 04/10/22 (from the past 24 hour(s))  Pregnancy, urine POC     Status: Abnormal   Collection Time: 04/10/22  9:47 AM  Result Value Ref Range   Preg Test, Ur POSITIVE (A) NEGATIVE  Urinalysis, Routine w reflex microscopic Urine, Clean Catch     Status: Abnormal   Collection Time: 04/10/22  9:53 AM  Result Value Ref Range   Color, Urine YELLOW YELLOW   APPearance CLOUDY (A) CLEAR   Specific Gravity, Urine 1.014 1.005 - 1.030   pH 7.0 5.0 - 8.0   Glucose, UA NEGATIVE NEGATIVE mg/dL   Hgb urine dipstick SMALL (A) NEGATIVE   Bilirubin Urine NEGATIVE NEGATIVE   Ketones, ur NEGATIVE NEGATIVE mg/dL   Protein, ur NEGATIVE NEGATIVE mg/dL   Nitrite NEGATIVE NEGATIVE   Leukocytes,Ua MODERATE (A) NEGATIVE   RBC / HPF 11-20 0 - 5 RBC/hpf   WBC, UA 6-10 0 - 5 WBC/hpf   Bacteria, UA FEW (A) NONE SEEN   Squamous Epithelial / LPF >50 (H) 0 - 5   Mucus PRESENT    Amorphous Crystal PRESENT   CBC     Status: Abnormal   Collection Time: 04/10/22 10:16 AM  Result Value Ref Range   WBC 3.8 (L) 4.0 - 10.5 K/uL    RBC 3.99 3.87 - 5.11 MIL/uL   Hemoglobin 13.1 12.0 - 15.0 g/dL   HCT 66.8 15.9 - 47.0 %   MCV 96.7 80.0 - 100.0 fL   MCH 32.8 26.0 - 34.0 pg   MCHC 33.9 30.0 - 36.0 g/dL   RDW 76.1 51.8 - 34.3 %   Platelets 236 150 - 400 K/uL   nRBC 0.0 0.0 - 0.2 %  hCG, quantitative, pregnancy     Status: Abnormal   Collection Time: 04/10/22 10:16 AM  Result Value Ref Range   hCG, Beta Chain, Quant, S 79 (H) <5 mIU/mL  Wet prep, genital     Status: Abnormal   Collection Time: 04/10/22 10:30 AM   Specimen: PATH Cytology Cervicovaginal Ancillary Only  Result Value Ref Range   Yeast Wet Prep HPF POC NONE SEEN NONE SEEN   Trich, Wet Prep NONE SEEN NONE SEEN   Clue Cells Wet Prep HPF POC PRESENT (A) NONE SEEN   WBC, Wet Prep HPF POC <10 <10   Sperm NONE SEEN    US OB LESS THAN 14 WEEKS WITH OB TRANSVAGINAL  Result Date: 04/10/2022 CLINICAL DATA:  Vaginal bleeding. No beta HCG available at time dictation. EXAM: OBSTETRIC <14 WK [place] AND TRANSVAGINAL OB US TECHNIQUE: Both transabdominal and transvaginal ultrasound examinations were performed for complete evaluation of the gestation as well as the maternal uterus, adnexal regions, and pelvic cul-de-sac. Transvaginal technique was performed to assess early pregnancy. COMPARISON:  None Available. FINDINGS: Intrauterine gestational sac: None Yolk sac:  Not Visualized. Embryo:  Not Visualized. Cardiac Activity: Not Visualized. Subchorionic hemorrhage:  None visualized. Maternal uterus/adnexae: Thickening of the endometrium measuring 14.2 mm but within normal limits for patient's age. Possible corpus luteal cyst in the right ovary which is considered physiologic/benign requiring no independent imaging follow-up. IMPRESSION: Non-localization of the pregnancy on this scan. No intrauterine gestational sac. No abnormal ovarian or adnexal masses. In the setting of positive pregnancy test/serum beta hCG sonographic differential diagnosis includes intrauterine gestation too  early to visualize, spontaneous abortion or occult ectopic gestation. Recommend close clinical follow-up and serial serum beta HCG monitoring, with repeat obstetric scan as  warranted by beta HCG levels and clinical assessment. Electronically Signed   By: Maudry Mayhew M.D.   On: 04/10/2022 11:36    MAU Course  Procedures  MDM Labs and [place] ordered and reviewed. No IUGS, YS or FP seen on [place], suspect early pregnancy but cannot r/o ectopic pregnancy or failed pregnancy, discussed with pt. Will follow quant in 48 hrs. Stable for discharge home.   Assessment and Plan   1. Pregnancy, location unknown    Discharge home Follow up at [facility] on 8/30 for stat qhcg Ectopic/SAB precautions  Allergies as of 04/10/2022   No Known Allergies      Medication List     STOP taking these medications    potassium chloride 10 MEQ tablet Commonly known as: Gustavus Messing, CNM 04/10/2022, 2:00 PM

## 2022-04-11 LAB — GC/CHLAMYDIA PROBE AMP (~~LOC~~) NOT AT ARMC
Chlamydia: NEGATIVE
Comment: NEGATIVE
Comment: NORMAL
Neisseria Gonorrhea: NEGATIVE

## 2022-04-11 LAB — CULTURE, OB URINE: Culture: 2000 — AB

## 2022-04-12 ENCOUNTER — Ambulatory Visit: Payer: Medicaid Other

## 2022-04-12 ENCOUNTER — Encounter: Payer: Self-pay | Admitting: Certified Nurse Midwife

## 2022-04-12 ENCOUNTER — Other Ambulatory Visit: Payer: Self-pay | Admitting: Emergency Medicine

## 2022-04-12 ENCOUNTER — Ambulatory Visit (INDEPENDENT_AMBULATORY_CARE_PROVIDER_SITE_OTHER): Payer: Self-pay | Admitting: Emergency Medicine

## 2022-04-12 VITALS — BP 129/76 | HR 77 | Ht 65.0 in | Wt 162.0 lb

## 2022-04-12 DIAGNOSIS — R8271 Bacteriuria: Secondary | ICD-10-CM | POA: Insufficient documentation

## 2022-04-12 DIAGNOSIS — O3680X0 Pregnancy with inconclusive fetal viability, not applicable or unspecified: Secondary | ICD-10-CM

## 2022-04-12 DIAGNOSIS — O09899 Supervision of other high risk pregnancies, unspecified trimester: Secondary | ICD-10-CM

## 2022-04-12 MED ORDER — AMPICILLIN 500 MG PO CAPS: 500.0000 mg | ORAL_CAPSULE | Freq: Three times a day (TID) | ORAL | 0 refills | Status: DC

## 2022-04-12 NOTE — Progress Notes (Signed)
Pt presents for stat bhcg follow-up from 8/28. Denies vaginal bleeding or abdominal pain, cramping at this time. Ampicllin 500mg  TID x 7days verbal order from M. , ordred, sent to pharmacy to treat GBS+ UTI.

## 2022-04-12 NOTE — Progress Notes (Addendum)
TC to patient to discuss hcg results.Reviewed by Dr. Alysia Penna. [place] scheduled for 2 weeks per Dr. Alysia Penna.

## 2022-04-12 NOTE — Progress Notes (Deleted)
Pt here today for STAT Beta from MAU follow up on 04/10/2022.  Pt denies any vaginal bleeding, pain or cramps. Pt advised after results come back from Beta, will be contacted via phone with plan of care per Dr Crissie Reese. Pt verbalized understanding.    Judeth Cornfield, RNC

## 2022-04-13 ENCOUNTER — Ambulatory Visit: Payer: Medicaid Other

## 2022-04-13 LAB — BETA HCG QUANT (REF LAB): hCG Quant: 171 m[IU]/mL

## 2022-04-21 ENCOUNTER — Inpatient Hospital Stay (HOSPITAL_COMMUNITY): Payer: Medicaid Other

## 2022-04-21 ENCOUNTER — Inpatient Hospital Stay (HOSPITAL_COMMUNITY)
Admission: AD | Admit: 2022-04-21 | Discharge: 2022-04-21 | Disposition: A | Discharge status: Home / Self Care | Payer: Medicaid Other | Attending: Obstetrics and Gynecology | Admitting: Obstetrics and Gynecology

## 2022-04-21 DIAGNOSIS — N760 Acute vaginitis: Secondary | ICD-10-CM

## 2022-04-21 DIAGNOSIS — R8271 Bacteriuria: Secondary | ICD-10-CM

## 2022-04-21 DIAGNOSIS — O209 Hemorrhage in early pregnancy, unspecified: Secondary | ICD-10-CM | POA: Insufficient documentation

## 2022-04-21 DIAGNOSIS — Z87891 Personal history of nicotine dependence: Secondary | ICD-10-CM | POA: Insufficient documentation

## 2022-04-21 DIAGNOSIS — O09291 Supervision of pregnancy with other poor reproductive or obstetric history, first trimester: Secondary | ICD-10-CM | POA: Insufficient documentation

## 2022-04-21 DIAGNOSIS — Z3A01 Less than 8 weeks gestation of pregnancy: Secondary | ICD-10-CM

## 2022-04-21 DIAGNOSIS — O99331 Smoking (tobacco) complicating pregnancy, first trimester: Secondary | ICD-10-CM | POA: Insufficient documentation

## 2022-04-21 LAB — WET PREP, GENITAL
Sperm: NONE SEEN
Trich, Wet Prep: NONE SEEN
WBC, Wet Prep HPF POC: <10 (ref <10)
Yeast Wet Prep HPF POC: NONE SEEN

## 2022-04-21 LAB — HCG, QUANTITATIVE, PREGNANCY: hCG, Beta Chain, Quant, S: 7414 m[IU]/mL — ABNORMAL HIGH (ref <5)

## 2022-04-21 MED ORDER — METRONIDAZOLE 0.75 % VA GEL: 1.0000 | Freq: Every day | VAGINAL | 0 refills | Status: DC

## 2022-04-21 NOTE — MAU Note (Signed)
Pt says she came her on 8-28 Went to [facility] 8-30- says barely 3 weeks . Tonight says -had sex on Wed - started VB when she wiped  Thursday and today - light pink when she wipes- sometimes  Denies cramping

## 2022-04-21 NOTE — MAU Provider Note (Signed)
History     CSN: [id number]  Arrival date and time: 04/21/22 1941   Event Date/Time   First Provider Initiated Contact with Patient 04/21/22 2032      No chief complaint on file.  Natasha Byrd is a 36 y.o. G3P2002 at [redacted]w[redacted]d by Unsure LMP who receives care at [facility].  She presents today for vaginal bleeding.  She states she had intercourse on Wednesday night and started having the bleeding. She states she continues to have the bleeding, but only with wiping.  She states the spotting is light pink and she has no cramping.     OB History     Gravida  3   Para  2   Term  2   Preterm  0   AB  0   Living  2      SAB  0   IAB  0   Ectopic  0   Multiple      Live Births  2        Obstetric Comments  Hx Pre- eclampsia         Past Medical History:  Diagnosis Date   Attention deficit disorder with hyperactivity    Benzodiazepine abuse (HCC)    Chlamydia    as teen   Depression    doing good now   Headache    History of suicidal ideation    Homicidal ideations    History of,   UTI (urinary tract infection)     Past Surgical History:  Procedure Laterality Date   TONSILLECTOMY      Family History  Problem Relation Age of Onset   Abnormal EKG Mother    Hypertension Mother    Sleep apnea Father    Hypertension Brother    Hypertension Maternal Grandmother     Social History   Tobacco Use   Smoking status: Former    Packs/day: 0.30    Types: Cigarettes   Smokeless tobacco: Never   Tobacco comments:    Quit smoking cig Spring 2023  Vaping Use   Vaping Use: Never used  Substance Use Topics   Alcohol use: Not Currently    Alcohol/week: 4.0 standard drinks of alcohol    Types: 4 Cans of beer per week    Comment: 2 yrs ago   Drug use: Not Currently    Types: Marijuana    Comment: none since incarcerated (~2020)    Allergies: No Known Allergies  Medications Prior to Admission  Medication Sig Dispense Refill Last Dose   ampicillin  (PRINCIPEN) 500 MG capsule Take 1 capsule (500 mg total) by mouth 3 (three) times daily. 21 capsule 0     Review of Systems  Constitutional:  Negative for chills and fever.  Gastrointestinal:  Negative for abdominal pain, nausea and vomiting.  Genitourinary:  Positive for dyspareunia ("Started to hurt a little bit") and vaginal bleeding. Negative for difficulty urinating, dysuria and vaginal discharge.  Neurological:  Negative for dizziness, light-headedness and headaches.   Physical Exam   Blood pressure 120/80, pulse 81, temperature 98.9 F (37.2 C), temperature source Oral, resp. rate 20, height 5\' 4"  (1.626 m), weight 73.1 kg, last menstrual period 03/14/2022.  Physical Exam Vitals reviewed.  Constitutional:      Appearance: Normal appearance.  HENT:     Head: Normocephalic and atraumatic.  Eyes:     Conjunctiva/sclera: Conjunctivae normal.  Cardiovascular:     Rate and Rhythm: Normal rate.  Pulmonary:     Effort:  Pulmonary effort is normal. No respiratory distress.  Musculoskeletal:        General: Normal range of motion.     Cervical back: Normal range of motion.  Neurological:     Mental Status: She is alert and oriented to person, place, and time.  Psychiatric:        Mood and Affect: Mood normal.        Behavior: Behavior normal.     MAU Course  Procedures Results for orders placed or performed during the hospital encounter of 04/21/22 (from the past 24 hour(s))  Wet prep, genital     Status: Abnormal   Collection Time: 04/21/22  8:15 PM  Result Value Ref Range   Yeast Wet Prep HPF POC NONE SEEN NONE SEEN   Trich, Wet Prep NONE SEEN NONE SEEN   Clue Cells Wet Prep HPF POC PRESENT (A) NONE SEEN   WBC, Wet Prep HPF POC <10 <10   Sperm NONE SEEN   hCG, quantitative, pregnancy     Status: Abnormal   Collection Time: 04/21/22  8:15 PM  Result Value Ref Range   hCG, Beta Chain, Quant, S 7,414 (H) <5 mIU/mL   US OB Transvaginal  Result Date: 04/21/2022 CLINICAL  DATA:  Vaginal bleeding; beta HCG in process at the time of dictation EXAM: TRANSVAGINAL OB ULTRASOUND TECHNIQUE: Transvaginal ultrasound was performed for complete evaluation of the gestation as well as the maternal uterus, adnexal regions, and pelvic cul-de-sac. COMPARISON:  Ultrasound 04/10/2022. FINDINGS: Intrauterine gestational sac: Single Yolk sac:  Visualized. Embryo:  Not Visualized. Cardiac Activity: Not Visualized. Heart Rate: Not visualized MSD: 7.7 mm   5 w   4 d Subchorionic hemorrhage:  None visualized. Maternal uterus/adnexae: Single gestational sac with yolk sac within the uterus. Unremarkable appearance of the bilateral adnexa with color Doppler flow in both ovaries. IMPRESSION: Intrauterine gestational sac with yolk sac. Mean sac diameter of 7.7 mm consistent with 5 week 4 day gestational age. No unexpected finding. Recommend close clinical follow-up with repeat obstetric scan as warranted by beta HCG and clinical assessment. Electronically Signed   By: Minerva Fester M.D.   On: 04/21/2022 21:17    MDM Labs: hCG Wet Prep Ultrasound Assessment and Plan  36 year old G3P2002 at 5.3 weeks Vaginal Spotting  -Reviewed POC with patient. -Discussed collecting wet prep as no treatment for BV at last visit. Reviewed treatment and patient agreeable if returns positive.  -Reviewed previous urine culture findings and informed that no treatment necessary as bacteria levels were not high, but will monitor.  Plan to repeat today. Urine cup given.  -Labs ordered. -Will send for [place].   Cherre Robins 04/21/2022, 8:34 PM   Reassessment (9:44 PM)  -Results as above. -Provider reviews with patient. -Questions addressed. Informed that provider does not know the exact date of conception!  -Discussed need for repeat US in 2 weeks to confirm viability. Message sent to [facility] and [facility] Pait for rescheduling accordingly. -Rx for metrogel sent to pharmacy on file.  -Precautions  reviewed. -Patient left prior to giving urine specimen. -Encouraged to call primary office or return to MAU if symptoms worsen or with the onset of new symptoms. -Discharged to home in stable condition.  Cherre Robins MSN, CNM Advanced Practice Provider, [facility] [employer]

## 2022-04-27 ENCOUNTER — Other Ambulatory Visit: Payer: Medicaid Other

## 2022-04-29 ENCOUNTER — Other Ambulatory Visit (HOSPITAL_COMMUNITY)
Admission: EM | Admit: 2022-04-29 | Discharge: 2022-05-02 | Disposition: A | Discharge status: Home / Self Care | Payer: Medicaid Other | Attending: Psychiatry | Admitting: Psychiatry

## 2022-04-29 DIAGNOSIS — F1910 Other psychoactive substance abuse, uncomplicated: Secondary | ICD-10-CM | POA: Insufficient documentation

## 2022-04-29 DIAGNOSIS — O99321 Drug use complicating pregnancy, first trimester: Secondary | ICD-10-CM | POA: Insufficient documentation

## 2022-04-29 DIAGNOSIS — F1120 Opioid dependence, uncomplicated: Secondary | ICD-10-CM | POA: Insufficient documentation

## 2022-04-29 DIAGNOSIS — F1420 Cocaine dependence, uncomplicated: Secondary | ICD-10-CM

## 2022-04-29 DIAGNOSIS — F909 Attention-deficit hyperactivity disorder, unspecified type: Secondary | ICD-10-CM | POA: Insufficient documentation

## 2022-04-29 DIAGNOSIS — Z20822 Contact with and (suspected) exposure to covid-19: Secondary | ICD-10-CM | POA: Insufficient documentation

## 2022-04-29 DIAGNOSIS — O99341 Other mental disorders complicating pregnancy, first trimester: Secondary | ICD-10-CM | POA: Insufficient documentation

## 2022-04-29 DIAGNOSIS — Z3A01 Less than 8 weeks gestation of pregnancy: Secondary | ICD-10-CM | POA: Insufficient documentation

## 2022-04-29 DIAGNOSIS — F1290 Cannabis use, unspecified, uncomplicated: Secondary | ICD-10-CM

## 2022-04-29 DIAGNOSIS — Z9151 Personal history of suicidal behavior: Secondary | ICD-10-CM | POA: Insufficient documentation

## 2022-04-29 LAB — COMPREHENSIVE METABOLIC PANEL
ALT: 10 U/L (ref 0–44)
AST: 16 U/L (ref 15–41)
Albumin: 3.9 g/dL (ref 3.5–5.0)
Alkaline Phosphatase: 40 U/L (ref 38–126)
Anion gap: 10 (ref 5–15)
BUN: 10 mg/dL (ref 6–20)
CO2: 23 mmol/L (ref 22–32)
Calcium: 9.8 mg/dL (ref 8.9–10.3)
Chloride: 103 mmol/L (ref 98–111)
Creatinine, Ser: 0.8 mg/dL (ref 0.44–1.00)
GFR, Estimated: >60 mL/min (ref >60)
Glucose, Bld: 116 mg/dL — ABNORMAL HIGH (ref 70–99)
Potassium: 3.5 mmol/L (ref 3.5–5.1)
Sodium: 136 mmol/L (ref 135–145)
Total Bilirubin: 0.6 mg/dL (ref 0.3–1.2)
Total Protein: 6.7 g/dL (ref 6.5–8.1)

## 2022-04-29 LAB — RESP PANEL BY RT-PCR (FLU A&B, COVID) ARPGX2
Influenza A by PCR: NEGATIVE
Influenza B by PCR: NEGATIVE
SARS Coronavirus 2 by RT PCR: NEGATIVE

## 2022-04-29 LAB — LIPID PANEL
Cholesterol: 128 mg/dL (ref 0–200)
HDL: 45 mg/dL (ref >40)
LDL Cholesterol: 70 mg/dL (ref 0–99)
Total CHOL/HDL Ratio: 2.8 RATIO
Triglycerides: 66 mg/dL (ref <150)
VLDL: 13 mg/dL (ref 0–40)

## 2022-04-29 LAB — CBC WITH DIFFERENTIAL/PLATELET
Abs Immature Granulocytes: 0.01 10*3/uL (ref 0.00–0.07)
Basophils Absolute: 0 10*3/uL (ref 0.0–0.1)
Basophils Relative: 0 %
Eosinophils Absolute: 0.1 10*3/uL (ref 0.0–0.5)
Eosinophils Relative: 1 %
HCT: 35.1 % — ABNORMAL LOW (ref 36.0–46.0)
Hemoglobin: 12.3 g/dL (ref 12.0–15.0)
Immature Granulocytes: 0 %
Lymphocytes Relative: 37 %
Lymphs Abs: 1.7 10*3/uL (ref 0.7–4.0)
MCH: 33.2 pg (ref 26.0–34.0)
MCHC: 35 g/dL (ref 30.0–36.0)
MCV: 94.9 fL (ref 80.0–100.0)
Monocytes Absolute: 0.3 10*3/uL (ref 0.1–1.0)
Monocytes Relative: 7 %
Neutro Abs: 2.4 10*3/uL (ref 1.7–7.7)
Neutrophils Relative %: 55 %
Platelets: 244 10*3/uL (ref 150–400)
RBC: 3.7 MIL/uL — ABNORMAL LOW (ref 3.87–5.11)
RDW: 11.9 % (ref 11.5–15.5)
WBC: 4.5 10*3/uL (ref 4.0–10.5)
nRBC: 0 % (ref 0.0–0.2)

## 2022-04-29 LAB — POC SARS CORONAVIRUS 2 AG: SARSCOV2ONAVIRUS 2 AG: NEGATIVE

## 2022-04-29 LAB — POCT URINE DRUG SCREEN - MANUAL ENTRY (I-SCREEN)
POC Amphetamine UR: NOT DETECTED
POC Buprenorphine (BUP): NOT DETECTED
POC Cocaine UR: NOT DETECTED
POC Marijuana UR: POSITIVE — AB
POC Methadone UR: NOT DETECTED
POC Methamphetamine UR: NOT DETECTED
POC Morphine: NOT DETECTED
POC Oxazepam (BZO): NOT DETECTED
POC Oxycodone UR: NOT DETECTED
POC Secobarbital (BAR): NOT DETECTED

## 2022-04-29 LAB — MAGNESIUM: Magnesium: 2.1 mg/dL (ref 1.7–2.4)

## 2022-04-29 LAB — HEMOGLOBIN A1C
Hgb A1c MFr Bld: 4.2 % — ABNORMAL LOW (ref 4.8–5.6)
Mean Plasma Glucose: 73.84 mg/dL

## 2022-04-29 LAB — POCT PREGNANCY, URINE: Preg Test, Ur: POSITIVE — AB

## 2022-04-29 LAB — TSH: TSH: 0.472 u[IU]/mL (ref 0.350–4.500)

## 2022-04-29 LAB — ETHANOL: Alcohol, Ethyl (B): <10 mg/dL (ref <10)

## 2022-04-29 MED ORDER — PRENATAL MULTIVITAMIN CH
1.0000 | ORAL_TABLET | Freq: Every day | ORAL | Status: DC
  Administered 2022-04-30 – 2022-05-02 (×3): 1 via ORAL
  Filled 2022-04-29 (×3): qty 1

## 2022-04-29 MED ORDER — FOLIC ACID 1 MG PO TABS
1.0000 mg | ORAL_TABLET | Freq: Every day | ORAL | Status: DC
  Administered 2022-04-30 – 2022-05-02 (×3): 1 mg via ORAL
  Filled 2022-04-29 (×4): qty 1

## 2022-04-29 NOTE — Progress Notes (Signed)
Patient remains asleep in bed without issue.  Will continue to monitor.

## 2022-04-29 NOTE — Progress Notes (Signed)
   04/29/22 1651  [place] (Walk-ins at [facility] only)  How Did You Hear About [place]? Self  What Is the Reason for Your Visit/Call Today? pt is a 35 yo female who presented voluntarily and unaccompanied due to worsening drug/alcohol use and a desire to detox. Pt recently (04/10/22) found out she is about 1 month pregnant and wants to detox so she has a healthy pregnancy. Pt stated she had been regularly using cocaine, Percocet, cannabis and alcohol. Since 8/28 when she found out she was pregnant, pt stated she has "cut way back." Pt denied SI, HI, NSSH, AVH and paranoia. Pt reported use of cocaine, cannabis and alcohol today and Perocets yesterday. Pt stated lives with her cousin and grandmother  How Long Has This Been Causing You Problems? > than 6 months  Have You Recently Had Any Thoughts About Hurting Yourself? No  Are You Planning to Commit Suicide/Harm Yourself At This time? No  Have you Recently Had Thoughts About [place]? No  Are You Planning To Harm Someone At This Time? No  Are you currently experiencing any auditory, visual or other hallucinations? No  Have You Used Any Alcohol or Drugs in the Past 24 Hours? Yes  Do you have any current medical co-morbidities that require immediate attention? No  Clinician description of patient physical appearance/behavior: Pt was calm, cooperative, alert and appeared fully oriented. Pt did not appear to be responding to internal stimuli, experiencing delusional thinking or to be intoxicated. Pt's speech and movement appeared within normal limits and pt's appearance was unremarkable. Pt was tearful throughout. Pt's mood seemed depressed and pt had a flat affect which was congruent. Pt's judgment and insight seemed poor.  What Do You Feel Would Help You the Most Today? Alcohol or Drug Use Treatment  Determination of Need Urgent (48 hours)  Options For Referral Facility-Based Crisis   Sherrina [name] T. Mare Ferrari, [place], [facility], [facility] Triage  Specialist [facility]

## 2022-04-29 NOTE — Progress Notes (Signed)
Patient denies vaginal bleeding or abdominal cramping.  Encouraged her to inform staff if these symptoms occur.

## 2022-04-29 NOTE — ED Notes (Addendum)
Pt sleeping resting easy no signs of distress note respirations are within normal limits

## 2022-04-29 NOTE — Progress Notes (Signed)
Patient is presently asleep in bed at this time without distress or complaint.  NARD.  Breathing even and unlabored.  No evidence of withdrawal.  Will monitor and provide safe environment.
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
## 2022-04-29 NOTE — ED Provider Notes (Cosign Needed Addendum)
[facility] Continuous Assessment Admission H&P  Date: 04/29/22 Patient Name: KENDAHL Byrd MRN: [id number] Chief Complaint:  Chief Complaint  Patient presents with   Addiction Problem      Diagnoses:  Final diagnoses:  Polysubstance abuse (HCC)    HPI:  Natasha Byrd 36 y.o., female patient presented to [facility] as a walk in requesting alcohol and opioid detox.  Reports she was referred by [facility] residential.  Natasha Byrd, 36 y.o., female patient seen face to face by this provider, consulted with Dr. Lucianne Muss; and chart reviewed on 04/29/22.  Per chart review patient has a past psychiatric history of MDD, ADHD, history of SI, and polysubstance abuse.  Reports she does not take any medications.  She does not have any psychiatric services in place.  She has 2 children ages 70 and 7 that she does not have custody of.  She is currently unemployed.  She lives with her cousin and grandmother.  She denies any history of inpatient psychiatric admissions.  Patient reports that she recently found out she is roughly 1 month pregnant.  Since she found out she was pregnant she has cut back on her substance use.  Reports she has not had any alcohol intake in the past 1-2 weeks.  This contradicts with her response with TTS.  She earlier reported to TTS that she used alcohol daily.  She endorses regular cocaine, Percocet, and THC use.  Reports she used cocaine earlier this morning and marijuana on the way to assessment.  She last took 1-2 Percocet pills today.  Before she was pregnant she would take up to 30 Percocet pills a day.  She is motivated to change and wants to have a healthy pregnancy.  She presented to [facility] residential and was told that she needed to complete detox at [facility].  She is interested and residential substance abuse treatment.  Reports Marcelino Duster at [facility] told her that pregnancy is not a barrier for residential treatment.  During evaluation Levenia L Lute is observed standing in  the hallway in no acute distress.  She is redirected to the assessment room.  She is alert/oriented x4 and cooperative.  She is fairly groomed, attentive, and makes good eye contact.  She has normal speech.  She is anxious.  She endorses depression but attributes it to her substance use.  She has a depressed affect.  She denies any concerns with appetite or sleep.  She denies SI/HI/AVH.  She contracts for safety.  Objectively there is no evidence that she is responding to internal/external stimuli.  Patient is able to answer questions appropriately.   Discussed admission to the continuous assessment unit for overnight observation.  Patient will be reassessed in the a.m. and would be a good candidate for FBC.  Explained expectations including lab work and the milieu.  Patient is refusing an EKG at this time.  PHQ 2-9:  Flowsheet Row ED from 04/29/2022 in [facility]  Thoughts that you would be better off dead, or of hurting yourself in some way Not at all  PHQ-9 Total Score 8       Flowsheet Row ED from 04/29/2022 in [facility] (Discharged) from 04/10/2022 in [facility] ED from 10/10/2021 in [facility] EMERGENCY DEPARTMENT  C-SSRS RISK CATEGORY No Risk No Risk No Risk        Total Time spent with patient: 30 minutes  Musculoskeletal  Strength &  Muscle Tone: within normal limits Gait & Station: normal Patient leans: N/A  Psychiatric Specialty Exam  Presentation General Appearance: Appropriate for Environment; Casual  Eye Contact:Good  Speech:Clear and Coherent; Normal Rate  Speech Volume:Normal  Handedness:Right   Mood and Affect  Mood:Anxious  Affect:Congruent   Thought Process  Thought Processes:Coherent  Descriptions of Associations:Intact  Orientation:Full (Time, Place and Person)  Thought Content:Logical  Diagnosis of Schizophrenia or Schizoaffective disorder  in past: No   Hallucinations:Hallucinations: None  Ideas of Reference:None  Suicidal Thoughts:Suicidal Thoughts: No  Homicidal Thoughts:Homicidal Thoughts: No   Sensorium  Memory:Immediate Good; Recent Good; Remote Good  Judgment:Fair  Insight:Fair   Executive Functions  Concentration:Good  Attention Span:Good  [place] of Knowledge:Good  Language:Good   Psychomotor Activity  Psychomotor Activity:Psychomotor Activity: Normal   Assets  Assets:Communication Skills; Desire for Improvement; Financial Resources/Insurance; Physical Health; Social Support; Resilience   Sleep  Sleep:Sleep: Good   Nutritional Assessment (For OBS and FBC admissions only) Has the patient had a weight loss or gain of 10 pounds or more in the last 3 months?: No Has the patient had a decrease in food intake/or appetite?: No Does the patient have dental problems?: No Does the patient have eating habits or behaviors that may be indicators of an eating disorder including binging or inducing vomiting?: No Has the patient recently lost weight without trying?: 0 Has the patient been eating poorly because of a decreased appetite?: 0 Malnutrition Screening Tool Score: 0    Physical Exam Vitals and nursing note reviewed.  Constitutional:      General: She is not in acute distress.    Appearance: Normal appearance. She is not ill-appearing.  HENT:     Head: Normocephalic.  Eyes:     Conjunctiva/sclera: Conjunctivae normal.  Cardiovascular:     Rate and Rhythm: Normal rate.  Pulmonary:     Effort: Pulmonary effort is normal.  Musculoskeletal:        General: Normal range of motion.     Cervical back: Normal range of motion.  Skin:    General: Skin is warm and dry.  Neurological:     Mental Status: She is alert and oriented to person, place, and time.  Psychiatric:        Attention and Perception: Attention and perception normal.        Mood and Affect: Mood is anxious and  depressed.        Speech: Speech normal.        Behavior: Behavior is cooperative.        Thought Content: Thought content normal.        Cognition and Memory: Cognition normal.        Judgment: Judgment is impulsive.    Review of Systems  Constitutional: Negative.   HENT: Negative.    Eyes: Negative.   Respiratory: Negative.    Cardiovascular: Negative.   Musculoskeletal: Negative.   Skin: Negative.   Neurological: Negative.   Psychiatric/Behavioral:  Positive for depression and substance abuse. The patient is nervous/anxious.     Blood pressure 109/70, pulse 80, temperature 98.6 F (37 C), temperature source Oral, resp. rate 18, height 5\' 5"  (1.651 m), weight 164 lb (74.4 kg), last menstrual period 03/14/2022, SpO2 100 %. Body mass index is 27.29 kg/m.  Past Psychiatric History: MDD, ADHD, polysubstance abuse, and history of SI.  Is the patient at risk to self? No  Has the patient been a risk to self in the past 6 months? No .  Has the patient been a risk to self within the distant past? Yes   Is the patient a risk to others? No   Has the patient been a risk to others in the past 6 months? No   Has the patient been a risk to others within the distant past? No   Past Medical History:  Past Medical History:  Diagnosis Date   Attention deficit disorder with hyperactivity    Benzodiazepine abuse (HCC)    Chlamydia    as teen   Depression    doing good now   Headache    History of suicidal ideation    Homicidal ideations    History of,   UTI (urinary tract infection)     Past Surgical History:  Procedure Laterality Date   TONSILLECTOMY      Family History:  Family History  Problem Relation Age of Onset   Abnormal EKG Mother    Hypertension Mother    Sleep apnea Father    Hypertension Brother    Hypertension Maternal Grandmother     Social History:  Social History   Socioeconomic History   Marital status: Single    Spouse name: Not on file   Number  of children: Not on file   Years of education: Not on file   Highest education level: Not on file  Occupational History   Not on file  Tobacco Use   Smoking status: Former    Packs/day: 0.30    Types: Cigarettes   Smokeless tobacco: Never   Tobacco comments:    Quit smoking cig Spring 2023  Vaping Use   Vaping Use: Never used  Substance and Sexual Activity   Alcohol use: Not Currently    Alcohol/week: 4.0 standard drinks of alcohol    Types: 4 Cans of beer per week    Comment: 2 yrs ago   Drug use: Not Currently    Types: Marijuana    Comment: none since incarcerated (~2020)   Sexual activity: Yes    Birth control/protection: None  Other Topics Concern   Not on file  Social History Narrative   ** Merged History Encounter **       Social Determinants of Health   Financial Resource Strain: Not on file  Food Insecurity: Not on file  Transportation Needs: Not on file  Physical Activity: Not on file  Stress: Not on file  Social Connections: Not on file  Intimate Partner Violence: Not on file    SDOH:  SDOH Screenings   Tobacco Use: Medium Risk (04/12/2022)    Last Labs:  Admission on 04/21/2022, Discharged on 04/21/2022  Component Date Value Ref Range Status   Yeast Wet Prep HPF POC 04/21/2022 NONE SEEN  NONE SEEN Final   Trich, Wet Prep 04/21/2022 NONE SEEN  NONE SEEN Final   Clue Cells Wet Prep HPF POC 04/21/2022 PRESENT (A)  NONE SEEN Final   WBC, Wet Prep HPF POC 04/21/2022 <10  <10 Final   Sperm 04/21/2022 NONE SEEN   Final   Performed at [facility], [address] [place], [place] [address]   hCG, Conley Rolls, Sharene Butters, S 04/21/2022 7,414 (H)  <5 mIU/mL Final   Comment:          GEST. AGE      CONC.  (mIU/mL)   <=1 WEEK        5 - 50     2 WEEKS       50 - 500  3 WEEKS       100 - 10,000     4 WEEKS     1,000 - 30,000     5 WEEKS     3,500 - 115,000   6-8 WEEKS     12,000 - 270,000    12 WEEKS     15,000 - 220,000        FEMALE AND NON-PREGNANT  FEMALE:     LESS THAN 5 mIU/mL Performed at [facility], [address] [place], [place] [address]   Clinical Support on 04/12/2022  Component Date Value Ref Range Status   hCG Quant 04/12/2022 171  mIU/mL Final   Comment:                      Female (Non-pregnant)    0 -     5                             (Postmenopausal)  0 -     8                      Female (Pregnant)                      Weeks of Gestation                              3                6 -    71                              4               10 -   750                              5              217 -  7138                              6              158 - 31795                              7             [phone]                              8            32065 [phone]                              9            [id number] [phone]                             10            [phone]  12            27832 -[phone]                             14            13950 - 62530                             15            12039 - 70971                             16             9040 - 56451                             17             8175 - [phone] Roche E                          CLIA methodology   Admission on 04/10/2022, Discharged on 04/10/2022  Component Date Value Ref Range Status   Color, Urine 04/10/2022 YELLOW  YELLOW Final   APPearance 04/10/2022 CLOUDY (A)  CLEAR Final   Specific Gravity, Urine 04/10/2022 1.014  1.005 - 1.030 Final   pH 04/10/2022 7.0  5.0 - 8.0 Final   Glucose, UA 04/10/2022 NEGATIVE  NEGATIVE mg/dL Final   Hgb urine dipstick 04/10/2022 SMALL (A)  NEGATIVE Final   Bilirubin Urine 04/10/2022 NEGATIVE  NEGATIVE Final   Ketones, ur 04/10/2022 NEGATIVE  NEGATIVE mg/dL Final   Protein, ur 14/78/2956 NEGATIVE  NEGATIVE mg/dL Final   Nitrite 21/30/8657 NEGATIVE  NEGATIVE Final   Leukocytes,Ua 04/10/2022 MODERATE (A)   NEGATIVE Final   RBC / HPF 04/10/2022 11-20  0 - 5 RBC/hpf Final   WBC, UA 04/10/2022 6-10  0 - 5 WBC/hpf Final   Bacteria, UA 04/10/2022 FEW (A)  NONE SEEN Final   Squamous Epithelial / LPF 04/10/2022 >50 (H)  0 - 5 Final   Mucus 04/10/2022 PRESENT   Final   Amorphous Crystal 04/10/2022 PRESENT   Final   Performed at [facility], [address], [place], [place] [address]   Preg Test, Ur 04/10/2022 POSITIVE (A)  NEGATIVE Final   Comment:        THE SENSITIVITY OF THIS METHODOLOGY IS >24 mIU/mL    WBC 04/10/2022 3.8 (L)  4.0 - 10.5 K/uL Final   RBC 04/10/2022 3.99  3.87 - 5.11 MIL/uL Final   Hemoglobin 04/10/2022 13.1  12.0 - 15.0 g/dL Final   HCT 29/52/8413 38.6  36.0 - 46.0 % Final   MCV 04/10/2022 96.7  80.0 - 100.0 fL Final   MCH 04/10/2022 32.8  26.0 - 34.0 pg Final   MCHC 04/10/2022 33.9  30.0 - 36.0 g/dL Final   RDW 24/40/1027 11.9  11.5 - 15.5 % Final   Platelets 04/10/2022 236  150 - 400 K/uL Final   nRBC 04/10/2022  0.0  0.0 - 0.2 % Final   Performed at [facility], [address], [place], [place] [address]   Neisseria Gonorrhea 04/10/2022 Negative   Final   Chlamydia 04/10/2022 Negative   Final   Comment 04/10/2022 Normal Reference Ranger Chlamydia - Negative   Final   Comment 04/10/2022 Normal Reference Range Neisseria Gonorrhea - Negative   Final   hCG, Beta Chain, Quant, S 04/10/2022 79 (H)  <5 mIU/mL Final   Comment:          GEST. AGE      CONC.  (mIU/mL)   <=1 WEEK        5 - 50     2 WEEKS       50 - 500     3 WEEKS       100 - 10,000     4 WEEKS     1,000 - 30,000     5 WEEKS     3,500 - 115,000   6-8 WEEKS     12,000 - 270,000    12 WEEKS     15,000 - 220,000        FEMALE AND NON-PREGNANT FEMALE:     LESS THAN 5 mIU/mL Performed at [facility], [address], [place], [place] [address]    Yeast Wet Prep HPF POC 04/10/2022 NONE SEEN  NONE SEEN Final   Trich, Wet Prep 04/10/2022 NONE SEEN  NONE SEEN Final   Clue Cells Wet Prep HPF  POC 04/10/2022 PRESENT (A)  NONE SEEN Final   WBC, Wet Prep HPF POC 04/10/2022 <10  <10 Final   Sperm 04/10/2022 NONE SEEN   Final   Performed at [facility], [address] [place], [place] [address]   Specimen Description 04/10/2022 URINE, CLEAN CATCH   Final   Special Requests 04/10/2022 NONE   Final   Culture 04/10/2022  (A)   Final                   Value:2,000 COLONIES/mL GROUP B STREP(S.AGALACTIAE)ISOLATED TESTING AGAINST S. AGALACTIAE NOT ROUTINELY PERFORMED DUE TO PREDICTABILITY OF AMP/PEN/VAN SUSCEPTIBILITY. Performed at [facility], [address], [place], [place] [address]    Report Status 04/10/2022 04/11/2022 FINAL   Final    Allergies: Patient has no known allergies.  PTA Medications: (Not in a hospital admission)   Medical Decision Making  Patient presents to [facility] requesting detox from alcohol, cocaine, and opioids.  She is currently 1 month pregnant.  She is interested in residential substance abuse treatment.  She will be admitted to the continuous assessment unit for overnight observation.  She will be reevaluated in the a.m. by psychiatry and could be a possible candidate    Recommendations  Based on my evaluation the patient does not appear to have an emergency medical condition.  Admit patient to the continuous assessment unit for overnight observation.  Patient will be reevaluated in the a.m. by psychiatry.  She will be evaluated for [facility] at that time.  CIWA and COWs scoring will be ordered, with no PRN medications due to pregnancy.   Will start prenatal vitamin daily and folic acid daily.   Lab Orders         Resp Panel by RT-PCR (Flu A&B, Covid) Anterior Nasal Swab         Pregnancy, urine         Urinalysis, Routine w reflex microscopic Urine, Clean Catch  CBC with Differential/Platelet         Comprehensive metabolic panel         Hemoglobin A1c         Magnesium         Ethanol         Lipid panel         TSH         POCT  Urine Drug Screen      Ardis Hughs, NP 04/29/22  6:31 PM

## 2022-04-29 NOTE — ED Notes (Signed)
Pt remains asleep with no signs of distress noted, respirations are easy as evidenced by the rise and fall of pt chest.

## 2022-04-30 DIAGNOSIS — F909 Attention-deficit hyperactivity disorder, unspecified type: Secondary | ICD-10-CM | POA: Diagnosis not present

## 2022-04-30 DIAGNOSIS — Z20822 Contact with and (suspected) exposure to covid-19: Secondary | ICD-10-CM | POA: Diagnosis not present

## 2022-04-30 DIAGNOSIS — F1910 Other psychoactive substance abuse, uncomplicated: Secondary | ICD-10-CM | POA: Diagnosis present

## 2022-04-30 DIAGNOSIS — F1120 Opioid dependence, uncomplicated: Secondary | ICD-10-CM | POA: Diagnosis not present

## 2022-04-30 MED ORDER — DIPHENHYDRAMINE HCL 25 MG PO CAPS
25.0000 mg | ORAL_CAPSULE | Freq: Once | ORAL | Status: AC
  Administered 2022-04-30: 25 mg via ORAL
  Filled 2022-04-30: qty 1

## 2022-04-30 NOTE — ED Notes (Signed)
Patient continues to rest with no sxs of distress noted - will continue to monitor for safety 

## 2022-04-30 NOTE — ED Notes (Signed)
Patient is sleeping no problems indicated.  

## 2022-04-30 NOTE — ED Notes (Signed)
Received patient this PM. Patient in day room watching TV with peer. Patient respirations are even and unlabored. Will continue to monitor for safety.

## 2022-04-30 NOTE — ED Provider Notes (Signed)
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 the patient had a decrease in food intake/or appetite?: No Does the patient have dental problems?: No Does the patient have eating habits or behaviors that may be indicators of an eating disorder including binging or inducing vomiting?: No Has the patient recently lost weight without trying?: 0 Has the patient been eating poorly because of a decreased appetite?: 0 Malnutrition Screening Tool Score: 0    Physical Exam  Physical Exam Constitutional:      Appearance: Normal appearance.  HENT:     Head: Normocephalic and atraumatic.  Pulmonary:     Effort: Pulmonary effort is normal.  Neurological:     Mental Status: She is alert and oriented to person, place, and time.    Review of Systems  Psychiatric/Behavioral:  Negative for hallucinations and suicidal ideas. The patient does not have insomnia.    Blood pressure 98/63, pulse 72, temperature 98.4 F (36.9 C), temperature source Oral, resp. rate 18, height _0  (1.651 m), weight 164 lb (74.4 kg), last menstrual period 03/14/2022, SpO2 100 %. Body mass index is 27.29 kg/m.  Treatment Plan Summary: Daily contact with patient to assess and evaluate symptoms and progress in treatment and Medication management   Natasha Byrd 36 y.o., female patient presented to [facility] as a walk in requesting alcohol and opioid detox.   Based on assessment today patient appears to be overall pleasant and motivated for sobriety.  Patient endorses cocaine and Percocet use however her UDS is negative for both of these items but is positive for [facility] which patient does endorse daily use up.  Regardless as patient is pregnant sobriety from all of these substances is recommended.  Patient has a low chance of displaying withdrawal symptoms from opioids or cocaine due to them no longer being in her urine despite endorsing use in the past 48 hours.  EKG: Patient refused - Labs-urine pregnancy: Positive, UDS: Positive for THC,  CBC: HCT 35.1, CMP: Glucose 116, A1c: 4.2, Mg: WNL, EtOH: Negative, lipid panel: WNL, TSH: WNL  Cannabis use disorder, severe Reported polysubstance abuse (stimulant-cocaine and opioid) - Continue to monitor patient for withdrawal - Recommend transfer to [facility] rehab inpatient once accepted - [facility]  Pregnancy -Prenatal vitamin - Folic acid      PGY-3 Freida Busman, MD 04/30/2022 2:41 PM

## 2022-04-30 NOTE — ED Notes (Signed)
Pt is awake and alert. She was escorted from [facility] to [facility] by staff.  Gait steady and denies difficulty/pain with ambulation. Pt denies SI, HI or AVH.  She was oriented to [facility], searched and signed admission paperwork.  Pt was given hot lunch meal and is currently in no distress.   Dr. Candie Chroman aware.

## 2022-04-30 NOTE — ED Notes (Signed)
Patient requested something to help with sleep. Provider notified.

## 2022-04-30 NOTE — ED Notes (Signed)
Patient has been calm and cooperative on the unit. Patient ate lunch and was transferred to [facility].

## 2022-04-30 NOTE — ED Provider Notes (Signed)
Facility Based Crisis Admission H&P  Date: 04/30/22 Patient Name: Natasha Byrd MRN: [id number] Chief Complaint:  Chief Complaint  Patient presents with   Addiction Problem      Diagnoses:  Final diagnoses:  Polysubstance abuse (HCC)    HPI: Natasha Byrd 36 y.o., female patient presented to [facility] as a walk in requesting alcohol and opioid detox.  Reports she was referred by [facility] residential.  She was admitted to the continuous assessment unit for overnight observation.   Natasha Byrd, 36 y.o., female patient seen face to face by this provider, consulted with Dr. Lucianne Muss; and chart reviewed on 04/30/22.  Per chart review patient has a past psychiatric history of MDD, ADHD, history of SI, and polysubstance abuse.  UDS upon admission is positive for THC and EtOH is negative.  On today's evaluation patient is observed sitting in her bed eating.  She is calm and pleasant upon approach.  She is alert/oriented x4, cooperative, and makes good eye contact.  She has normal speech and behavior.  She denies any concerns with appetite or sleep.  She denies SI/HI/AVH.  She is denying any withdrawal symptoms at this time.  She continues to be motivated and to seek residential substance abuse treatment.  Discussed admission to the facility based crisis center and patient is in agreement.    PHQ 2-9:  Flowsheet Row ED from 04/29/2022 in [facility]  Thoughts that you would be better off dead, or of hurting yourself in some way Not at all  PHQ-9 Total Score 0       Flowsheet Row ED from 04/29/2022 in [facility] Admission (Discharged) from 04/10/2022 in [facility] Assessment [facility] ED from 10/10/2021 in [facility] EMERGENCY DEPARTMENT  C-SSRS RISK CATEGORY No Risk No Risk No Risk        Total Time spent with patient: 30 minutes  Musculoskeletal  Strength & Muscle Tone: within normal limits Gait & Station:  normal Patient leans: N/A  Psychiatric Specialty Exam  Presentation General Appearance: Appropriate for Environment; Casual  Eye Contact:Good  Speech:Clear and Coherent  Speech Volume:Normal  Handedness:Right   Mood and Affect  Mood:Euthymic  Affect:Appropriate; Congruent   Thought Process  Thought Processes:Coherent  Descriptions of Associations:Intact  Orientation:Full (Time, Place and Person)  Thought Content:Logical  Diagnosis of Schizophrenia or Schizoaffective disorder in past: No   Hallucinations:Hallucinations: None  Ideas of Reference:None  Suicidal Thoughts:Suicidal Thoughts: No  Homicidal Thoughts:Homicidal Thoughts: No   Sensorium  Memory:Immediate Good; Recent Good  Judgment:Good  Insight:Good   Executive Functions  Concentration:Good  Attention Span:Good  Recall:Good  Fund of Knowledge:Good  Language:Good   Psychomotor Activity  Psychomotor Activity:Psychomotor Activity: Normal   Assets  Assets:Communication Skills; Desire for Improvement; Resilience   Sleep  Sleep:Sleep: Good   Nutritional Assessment (For OBS and FBC admissions only) Has the patient had a weight loss or gain of 10 pounds or more in the last 3 months?: No Has the patient had a decrease in food intake/or appetite?: No Does the patient have dental problems?: No Does the patient have eating habits or behaviors that may be indicators of an eating disorder including binging or inducing vomiting?: No Has the patient recently lost weight without trying?: 0 Has the patient been eating poorly because of a decreased appetite?: 0 Malnutrition Screening Tool Score: 0    Physical Exam Vitals and nursing note reviewed.  Constitutional:      General: She  is not in acute distress.    Appearance: Normal appearance. She is not ill-appearing.  HENT:     Head: Normocephalic.  Eyes:     General:        Right eye: No discharge.        Left eye: No discharge.      Conjunctiva/sclera: Conjunctivae normal.     Pupils: Pupils are equal, round, and reactive to light.  Cardiovascular:     Rate and Rhythm: Normal rate.  Pulmonary:     Effort: Pulmonary effort is normal.  Musculoskeletal:        General: Normal range of motion.     Cervical back: Normal range of motion.  Skin:    General: Skin is warm and dry.  Neurological:     Mental Status: She is alert and oriented to person, place, and time.  Psychiatric:        Attention and Perception: Attention and perception normal.        Mood and Affect: Mood and affect normal.        Speech: Speech normal.        Behavior: Behavior normal. Behavior is cooperative.        Thought Content: Thought content normal.        Cognition and Memory: Cognition normal.        Judgment: Judgment normal.    Review of Systems  Constitutional: Negative.   HENT: Negative.    Eyes: Negative.   Respiratory: Negative.    Cardiovascular: Negative.   Musculoskeletal: Negative.   Skin: Negative.   Neurological: Negative.   Psychiatric/Behavioral:  Positive for substance abuse.     Blood pressure 110/66, pulse 77, temperature 97.8 F (36.6 C), temperature source Oral, resp. rate 18, height  (1.651 m), weight 164 lb (74.4 kg), last menstrual period 03/14/2022, SpO2 98 %. Body mass index is 27.29 kg/m.  Past Psychiatric History: MDD, ADHD, polysubstance abuse, and history of SI.  Is the patient at risk to self? No  Has the patient been a risk to self in the past 6 months? No .    Has the patient been a risk to self within the distant past? No   Is the patient a risk to others? No   Has the patient been a risk to others in the past 6 months? No   Has the patient been a risk to others within the distant past? No   Past Medical History:  Past Medical History:  Diagnosis Date   Attention deficit disorder with hyperactivity    Benzodiazepine abuse (HCC)    Chlamydia    as teen   Depression    doing good now    Headache    History of suicidal ideation    Homicidal ideations    History of,   UTI (urinary tract infection)     Past Surgical History:  Procedure Laterality Date   TONSILLECTOMY      Family History:  Family History  Problem Relation Age of Onset   Abnormal EKG Mother    Hypertension Mother    Sleep apnea Father    Hypertension Brother    Hypertension Maternal Grandmother     Social History:  Social History   Socioeconomic History   Marital status: Single    Spouse name: Not on file   Number of children: Not on file   Years of education: Not on file   Highest education level: Not on file  Occupational History  Not on file  Tobacco Use   Smoking status: Former    Packs/day: 0.30    Types: Cigarettes   Smokeless tobacco: Never   Tobacco comments:    Quit smoking cig Spring 2023  Vaping Use   Vaping Use: Never used  Substance and Sexual Activity   Alcohol use: Not Currently    Alcohol/week: 4.0 standard drinks of alcohol    Types: 4 Cans of beer per week    Comment: 2 yrs ago   Drug use: Not Currently    Types: Marijuana    Comment: none since incarcerated (~2020)   Sexual activity: Yes    Birth control/protection: None  Other Topics Concern   Not on file  Social History Narrative   ** Merged History Encounter **       Social Determinants of Health   Financial Resource Strain: Not on file  Food Insecurity: Not on file  Transportation Needs: Not on file  Physical Activity: Not on file  Stress: Not on file  Social Connections: Not on file  Intimate Partner Violence: Not on file    SDOH:  SDOH Screenings   Depression (PHQ2-9): Low Risk  (04/30/2022)  Recent Concern: Depression (PHQ2-9) - Medium Risk (04/29/2022)  Tobacco Use: Medium Risk (04/12/2022)    Last Labs:  Admission on 04/29/2022  Component Date Value Ref Range Status   POC Amphetamine UR 04/29/2022 None Detected  NONE DETECTED (Cut Off Level 1000 ng/mL) Preliminary   POC  Secobarbital (BAR) 04/29/2022 None Detected  NONE DETECTED (Cut Off Level 300 ng/mL) Preliminary   POC Buprenorphine (BUP) 04/29/2022 None Detected  NONE DETECTED (Cut Off Level 10 ng/mL) Preliminary   POC Oxazepam (BZO) 04/29/2022 None Detected  NONE DETECTED (Cut Off Level 300 ng/mL) Preliminary   POC Cocaine UR 04/29/2022 None Detected  NONE DETECTED (Cut Off Level 300 ng/mL) Preliminary   POC Methamphetamine UR 04/29/2022 None Detected  NONE DETECTED (Cut Off Level 1000 ng/mL) Preliminary   POC Morphine 04/29/2022 None Detected  NONE DETECTED (Cut Off Level 300 ng/mL) Preliminary   POC Methadone UR 04/29/2022 None Detected  NONE DETECTED (Cut Off Level 300 ng/mL) Preliminary   POC Oxycodone UR 04/29/2022 None Detected  NONE DETECTED (Cut Off Level 100 ng/mL) Preliminary   POC Marijuana UR 04/29/2022 Positive (A)  NONE DETECTED (Cut Off Level 50 ng/mL) Preliminary   SARS Coronavirus 2 by RT PCR 04/29/2022 NEGATIVE  NEGATIVE Final   Comment: (NOTE) SARS-CoV-2 target nucleic acids are NOT DETECTED.  The SARS-CoV-2 RNA is generally detectable in upper respiratory specimens during the acute phase of infection. The lowest concentration of SARS-CoV-2 viral copies this assay can detect is 138 copies/mL. A negative result does not preclude SARS-Cov-2 infection and should not be used as the sole basis for treatment or other patient management decisions. A negative result may occur with  improper specimen collection/handling, submission of specimen other than nasopharyngeal swab, presence of viral mutation(s) within the areas targeted by this assay, and inadequate number of viral copies(<138 copies/mL). A negative result must be combined with clinical observations, patient history, and epidemiological information. The expected result is Negative.  Fact Sheet for Patients:  BloggerCourse.com  Fact Sheet for Healthcare Providers:   SeriousBroker.it  This test is no                          t yet approved or cleared by the [place] FDA and  has been authorized for detection  and/or diagnosis of SARS-CoV-2 by FDA under an Emergency Use Authorization (EUA). This EUA will remain  in effect (meaning this test can be used) for the duration of the COVID-19 declaration under Section 564(b)(1) of the Act, 21 U.S.C.section 360bbb-3(b)(1), unless the authorization is terminated  or revoked sooner.       Influenza A by PCR 04/29/2022 NEGATIVE  NEGATIVE Final   Influenza B by PCR 04/29/2022 NEGATIVE  NEGATIVE Final   Comment: (NOTE) The Xpert Xpress SARS-CoV-2/FLU/RSV plus assay is intended as an aid in the diagnosis of influenza from Nasopharyngeal swab specimens and should not be used as a sole basis for treatment. Nasal washings and aspirates are unacceptable for Xpert Xpress SARS-CoV-2/FLU/RSV testing.  Fact Sheet for Patients: BloggerCourse.com  Fact Sheet for Healthcare Providers: SeriousBroker.it  This test is not yet approved or cleared by the [place] FDA and has been authorized for detection and/or diagnosis of SARS-CoV-2 by FDA under an Emergency Use Authorization (EUA). This EUA will remain in effect (meaning this test can be used) for the duration of the COVID-19 declaration under Section 564(b)(1) of the Act, 21 U.S.C. section 360bbb-3(b)(1), unless the authorization is terminated or revoked.  Performed at [facility], [address], [place], [place] [address]    WBC 04/29/2022 4.5  4.0 - 10.5 K/uL Final   RBC 04/29/2022 3.70 (L)  3.87 - 5.11 MIL/uL Final   Hemoglobin 04/29/2022 12.3  12.0 - 15.0 g/dL Final   HCT 69/62/9528 35.1 (L)  36.0 - 46.0 % Final   MCV 04/29/2022 94.9  80.0 - 100.0 fL Final   MCH 04/29/2022 33.2  26.0 - 34.0 pg Final   MCHC 04/29/2022 35.0  30.0 - 36.0 g/dL Final   RDW 41/32/4401  11.9  11.5 - 15.5 % Final   Platelets 04/29/2022 244  150 - 400 K/uL Final   nRBC 04/29/2022 0.0  0.0 - 0.2 % Final   Neutrophils Relative % 04/29/2022 55  % Final   Neutro Abs 04/29/2022 2.4  1.7 - 7.7 K/uL Final   Lymphocytes Relative 04/29/2022 37  % Final   Lymphs Abs 04/29/2022 1.7  0.7 - 4.0 K/uL Final   Monocytes Relative 04/29/2022 7  % Final   Monocytes Absolute 04/29/2022 0.3  0.1 - 1.0 K/uL Final   Eosinophils Relative 04/29/2022 1  % Final   Eosinophils Absolute 04/29/2022 0.1  0.0 - 0.5 K/uL Final   Basophils Relative 04/29/2022 0  % Final   Basophils Absolute 04/29/2022 0.0  0.0 - 0.1 K/uL Final   Immature Granulocytes 04/29/2022 0  % Final   Abs Immature Granulocytes 04/29/2022 0.01  0.00 - 0.07 K/uL Final   Performed at [facility], [address] [place], [place] [address]   Sodium 04/29/2022 136  135 - 145 mmol/L Final   Potassium 04/29/2022 3.5  3.5 - 5.1 mmol/L Final   Chloride 04/29/2022 103  98 - 111 mmol/L Final   CO2 04/29/2022 23  22 - 32 mmol/L Final   Glucose, Bld 04/29/2022 116 (H)  70 - 99 mg/dL Final   Glucose reference range applies only to samples taken after fasting for at least 8 hours.   BUN 04/29/2022 10  6 - 20 mg/dL Final   Creatinine, Ser 04/29/2022 0.80  0.44 - 1.00 mg/dL Final   Calcium 36/64/4034 9.8  8.9 - 10.3 mg/dL Final   Total Protein 74/25/9563 6.7  6.5 - 8.1 g/dL Final   Albumin 87/56/4332 3.9  3.5 - 5.0 g/dL Final   AST 16/10/960409/16/2023 16  15 - 41 U/L Final   ALT 04/29/2022 10  0 - 44 U/L Final   Alkaline Phosphatase 04/29/2022 40  38 - 126 U/L Final   Total Bilirubin 04/29/2022 0.6  0.3 - 1.2 mg/dL Final   GFR, Estimated 04/29/2022 >60  >60 mL/min Final   Comment: (NOTE) Calculated using the CKD-EPI Creatinine Equation (2021)    Anion gap 04/29/2022 10  5 - 15 Final   Performed at [facility], [address] [place], [place] [address]   Hgb A1c MFr Bld 04/29/2022 4.2 (L)  4.8 - 5.6 % Final   Comment: (NOTE) Pre  diabetes:          5.7%-6.4%  Diabetes:              >6.4%  Glycemic control for   <7.0% adults with diabetes    Mean Plasma Glucose 04/29/2022 73.84  mg/dL Final   Performed at [facility], [address] [place], [place] [address]   Magnesium 04/29/2022 2.1  1.7 - 2.4 mg/dL Final   Performed at [facility], [address] [place], [place] [address]   Alcohol, Ethyl (B) 04/29/2022 <10  <10 mg/dL Final   Comment: (NOTE) Lowest detectable limit for serum alcohol is 10 mg/dL.  For medical purposes only. Performed at [facility], [address] [place], [place] [address]    Cholesterol 04/29/2022 128  0 - 200 mg/dL Final   Triglycerides 86/57/846909/16/2023 66  <150 mg/dL Final   HDL 62/95/284109/16/2023 45  >40 mg/dL Final   Total CHOL/HDL Ratio 04/29/2022 2.8  RATIO Final   VLDL 04/29/2022 13  0 - 40 mg/dL Final   LDL Cholesterol 04/29/2022 70  0 - 99 mg/dL Final   Comment:        Total Cholesterol/HDL:CHD Risk Coronary Heart Disease Risk Table                     Men   Women  1/2 Average Risk   3.4   3.3  Average Risk       5.0   4.4  2 X Average Risk   9.6   7.1  3 X Average Risk  23.4   11.0        Use the calculated Patient Ratio above and the CHD Risk Table to determine the patient's CHD Risk.        ATP III CLASSIFICATION (LDL):  <100     mg/dL   Optimal  [phone]  mg/dL   Near or Above                    Optimal  130-159  mg/dL   Borderline  [phone]  mg/dL   High  >664>190     mg/dL   Very High Performed at [facility], [address] [place], [place] [address]    SARSCOV2ONAVIRUS 2 AG 04/29/2022 NEGATIVE  NEGATIVE Final   Comment: (NOTE) SARS-CoV-2 antigen NOT DETECTED.   Negative results are presumptive.  Negative results do not preclude SARS-CoV-2 infection and should not be used as the sole basis for treatment or other patient management decisions, including infection  control decisions, particularly in the presence of clinical signs and   symptoms consistent with COVID-19, or in those who have been in contact with the virus.  Negative results must be combined with clinical observations,  patient history, and epidemiological information. The expected result is Negative.  Fact Sheet for Patients: HandmadeRecipes.com.cy  Fact Sheet for Healthcare Providers: FuneralLife.at  This test is not yet approved or cleared by the [place] FDA and  has been authorized for detection and/or diagnosis of SARS-CoV-2 by FDA under an Emergency Use Authorization (EUA).  This EUA will remain in effect (meaning this test can be used) for the duration of  the COV                          ID-19 declaration under Section 564(b)(1) of the Act, 21 U.S.C. section 360bbb-3(b)(1), unless the authorization is terminated or revoked sooner.     Preg Test, Ur 04/29/2022 POSITIVE (A)  NEGATIVE Final   Comment:        THE SENSITIVITY OF THIS METHODOLOGY IS >24 mIU/mL    TSH 04/29/2022 0.472  0.350 - 4.500 uIU/mL Final   Comment: Performed by a 3rd Generation assay with a functional sensitivity of <=0.01 uIU/mL. Performed at [facility], [place] [address] [place], [place] [address]   Admission on 04/21/2022, Discharged on 04/21/2022  Component Date Value Ref Range Status   Yeast Wet Prep HPF POC 04/21/2022 NONE SEEN  NONE SEEN Final   Trich, Wet Prep 04/21/2022 NONE SEEN  NONE SEEN Final   Clue Cells Wet Prep HPF POC 04/21/2022 PRESENT (A)  NONE SEEN Final   WBC, Wet Prep HPF POC 04/21/2022 <10  <10 Final   Sperm 04/21/2022 NONE SEEN   Final   Performed at [facility], [place] [address] [place], [place] [address]   hCG, Ollen Barges, America Brown, S 04/21/2022 7,414 (H)  <5 mIU/mL Final   Comment:          GEST. AGE      CONC.  (mIU/mL)   <=1 WEEK        5 - 50     2 WEEKS       50 - 500     3 WEEKS       100 - 10,000     4 WEEKS     1,000 - 30,000     5 WEEKS     3,500 - 115,000   6-8 WEEKS      12,000 - 270,000    12 WEEKS     15,000 - 220,000        FEMALE AND NON-PREGNANT FEMALE:     LESS THAN 5 mIU/mL Performed at [facility], [place] [address], [place], [place] [address]   Clinical Support on 04/12/2022  Component Date Value Ref Range Status   hCG Quant 04/12/2022 171  mIU/mL Final   Comment:                      Female (Non-pregnant)    0 -     5                             (Postmenopausal)  0 -     8                      Female (Pregnant)                      Weeks of Gestation  3                6 -    71                              4               10 -   750                              5              217 -  7138                              6              [place]  CLIA methodology   Admission on 04/10/2022, Discharged on 04/10/2022  Component Date Value Ref Range Status   Color, Urine 04/10/2022 YELLOW  YELLOW Final   APPearance 04/10/2022 CLOUDY (A)  CLEAR Final   Specific Gravity, Urine 04/10/2022 1.014  1.005 - 1.030 Final   pH 04/10/2022 7.0  5.0 - 8.0 Final   Glucose, UA 04/10/2022 NEGATIVE  NEGATIVE mg/dL Final   Hgb urine dipstick 04/10/2022 SMALL (A)  NEGATIVE Final   Bilirubin Urine 04/10/2022 NEGATIVE  NEGATIVE Final   Ketones, ur 04/10/2022 NEGATIVE  NEGATIVE mg/dL Final   Protein, ur 16/05/9603 NEGATIVE  NEGATIVE mg/dL Final    Nitrite 54/04/8118 NEGATIVE  NEGATIVE Final   Leukocytes,Ua 04/10/2022 MODERATE (A)  NEGATIVE Final   RBC / HPF 04/10/2022 11-20  0 - 5 RBC/hpf Final   WBC, UA 04/10/2022 6-10  0 - 5 WBC/hpf Final   Bacteria, UA 04/10/2022 FEW (A)  NONE SEEN Final   Squamous Epithelial / LPF 04/10/2022 >50 (H)  0 - 5 Final   Mucus 04/10/2022 PRESENT   Final   Amorphous Crystal 04/10/2022 PRESENT   Final   Performed at [facility], [address], [place], [place] [address]   Preg Test, Ur 04/10/2022 POSITIVE (A)  NEGATIVE Final   Comment:        THE SENSITIVITY OF THIS METHODOLOGY IS >24 mIU/mL    WBC 04/10/2022 3.8 (L)  4.0 - 10.5 K/uL Final   RBC 04/10/2022 3.99  3.87 - 5.11 MIL/uL Final   Hemoglobin 04/10/2022 13.1  12.0 - 15.0 g/dL Final   HCT 95/62/1308 38.6  36.0 - 46.0 % Final   MCV 04/10/2022 96.7  80.0 - 100.0 fL Final   MCH 04/10/2022 32.8  26.0 - 34.0 pg Final   MCHC 04/10/2022 33.9  30.0 - 36.0 g/dL Final   RDW 65/78/4696 11.9  11.5 - 15.5 % Final   Platelets 04/10/2022 236  150 - 400 K/uL Final   nRBC 04/10/2022 0.0  0.0 - 0.2 % Final   Performed at [facility], [address] [place], [place] [address]   Neisseria Gonorrhea 04/10/2022 Negative   Final   Chlamydia 04/10/2022 Negative   Final   Comment 04/10/2022 Normal Reference Ranger Chlamydia - Negative   Final   Comment 04/10/2022 Normal Reference Range Neisseria Gonorrhea - Negative   Final   hCG, Beta Chain, Quant, S 04/10/2022 79 (H)  <5 mIU/mL Final   Comment:          GEST. AGE      CONC.  (mIU/mL)   <=1 WEEK        5 - 50     2 WEEKS       50 - 500     3 WEEKS       100 - 10,000     4 WEEKS     1,000 - 30,000     5 WEEKS     3,500 - 115,000   6-8 WEEKS     12,000 - 270,000    12 WEEKS     15,000 - 220,000        FEMALE AND NON-PREGNANT FEMALE:     LESS THAN 5 mIU/mL Performed at [facility], [address] [place], [place] [address]    Yeast Wet Prep HPF POC 04/10/2022 NONE SEEN  NONE SEEN Final    Trich, Wet Prep 04/10/2022 NONE SEEN  NONE SEEN Final   Clue Cells  Wet Prep HPF POC 04/10/2022 PRESENT (A)  NONE SEEN Final   WBC, Wet Prep HPF POC 04/10/2022 <10  <10 Final   Sperm 04/10/2022 NONE SEEN   Final   Performed at [facility], [address] [place], [place] [address]   Specimen Description 04/10/2022 URINE, CLEAN CATCH   Final   Special Requests 04/10/2022 NONE   Final   Culture 04/10/2022  (A)   Final                   Value:2,000 COLONIES/mL GROUP B STREP(S.AGALACTIAE)ISOLATED TESTING AGAINST S. AGALACTIAE NOT ROUTINELY PERFORMED DUE TO PREDICTABILITY OF AMP/PEN/VAN SUSCEPTIBILITY. Performed at [facility], [address] [place], [place] [address]    Report Status 04/10/2022 04/11/2022 FINAL   Final    Allergies: Patient has no known allergies.  PTA Medications: (Not in a hospital admission)   Long Term Goals: Improvement in symptoms so as ready for discharge  Short Term Goals: Patient will verbalize feelings in meetings with treatment team members., Patient will attend at least of 50% of the groups daily., Pt will complete the PHQ9 on admission, day 3 and discharge., Patient will participate in completing the [place] Suicide Severity Rating Scale, Patient will score a low risk of violence for 24 hours prior to discharge, and Patient will take medications as prescribed daily.  Medical Decision Making  Patient continues to seek residential substance abuse treatment.  She is criteria for treatment in the [facility].    Recommendations  Based on my evaluation the patient does not appear to have an emergency medical condition.  Admit patient to the facility based crisis unit.  We will notify social worker that patient seeks assistance to residential substance abuse treatment-[facility]. In addition Patient will need to be linked with services such as family-planning, and OB/GYN services.  Ardis Hughs, NP 04/30/22  6:23 PM

## 2022-05-01 ENCOUNTER — Encounter (HOSPITAL_COMMUNITY): Payer: Self-pay

## 2022-05-01 DIAGNOSIS — Z20822 Contact with and (suspected) exposure to covid-19: Secondary | ICD-10-CM | POA: Diagnosis not present

## 2022-05-01 DIAGNOSIS — F909 Attention-deficit hyperactivity disorder, unspecified type: Secondary | ICD-10-CM | POA: Diagnosis not present

## 2022-05-01 DIAGNOSIS — F1910 Other psychoactive substance abuse, uncomplicated: Secondary | ICD-10-CM | POA: Diagnosis not present

## 2022-05-01 DIAGNOSIS — F1120 Opioid dependence, uncomplicated: Secondary | ICD-10-CM | POA: Diagnosis not present

## 2022-05-01 MED ORDER — METRONIDAZOLE 0.75 % VA GEL: 1.0000 | Freq: Every day | VAGINAL | 1 refills | Status: DC

## 2022-05-01 MED ORDER — FOLIC ACID 1 MG PO TABS: 1.0000 mg | ORAL_TABLET | Freq: Every day | ORAL | 1 refills | Status: DC

## 2022-05-01 MED ORDER — DIPHENHYDRAMINE HCL 25 MG PO CAPS
25.0000 mg | ORAL_CAPSULE | Freq: Once | ORAL | Status: AC
  Administered 2022-05-01: 25 mg via ORAL

## 2022-05-01 MED ORDER — DIPHENHYDRAMINE HCL 25 MG PO CAPS
ORAL_CAPSULE | ORAL | Status: AC
  Filled 2022-05-01: qty 1

## 2022-05-01 MED ORDER — PRENATAL MULTIVITAMIN CH: 1.0000 | ORAL_TABLET | Freq: Every day | ORAL | 1 refills | Status: DC

## 2022-05-01 NOTE — Tx Team (Signed)
LCSW Progress Note  [phone] - LCSW made referral for residential treatment at [place], [phone].  LCSW provided clinical documentation needed to assist in the referral.  LCSW provided pt with the appointment time of 0900, 03 May 2022 to meet with Donata Duff.  This information has been updated in the following providers section of the AVS.  LCSW spoke with pt about her long-term/short-term goals and what type of treatment she was seeking after her residential care with [facility].   Omelia Blackwater, MSW, [place] [facility] [phone] phone [phone] work mobile

## 2022-05-01 NOTE — Progress Notes (Signed)
Received Natasha Byrd this AM asleep in her bed, she woke up on her own, received breakfast and shortly thereafter talked with the Education officer, museum. Later she was compliant with her medications. She denied all of the psychiatric symptoms and feels ready to transition to [facility].

## 2022-05-01 NOTE — ED Provider Notes (Signed)
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   Based on assessment today patient appears to be overall pleasant and motivated for sobriety.  Patient endorses cocaine and Percocet use however her UDS is negative for both of these items but is positive for [facility] which patient does endorse daily use up.  Regardless as patient is pregnant sobriety from all of these substances is recommended.  Patient has a low chance of displaying withdrawal symptoms from opioids or cocaine due to them no longer being in her urine despite endorsing use in the past 48 hours.  EKG: Patient refused - Labs-urine pregnancy: Positive, UDS: Positive for THC, CBC: HCT 35.1, CMP: Glucose 116, A1c: 4.2, Mg: WNL, EtOH: Negative, lipid panel: WNL, TSH: WNL  Cannabis use disorder, severe Reported polysubstance abuse (stimulant-cocaine and opioid) - Continue to monitor patient for withdrawal - Recommend transfer to [facility] rehab inpatient once accepted - COWS  Pregnancy -Prenatal vitamin - Folic acid  DISPO: Tentative date: 05/03/2022 Location: [facility] 14-day sample and 30-day prescription (written) with 1 month refill completed  Signed: Merrily Brittle, DO Psychiatry Resident, PGY-2 [facility] Based Crisis  05/01/2022, 11:23 AM

## 2022-05-01 NOTE — BH IP Treatment Plan (Signed)
Interdisciplinary Treatment and Diagnostic Plan Update  05/01/2022 Time of Session: 0845  Natasha Byrd MRN: [id number]  Diagnosis:  Final diagnoses:  Polysubstance abuse ([place])     Current Medications:  Current Facility-Administered Medications  Medication Dose Route Frequency Provider Last Rate Last Admin   folic acid (FOLVITE) tablet 1 mg  1 mg Oral Daily Revonda Humphrey, NP   1 mg at 05/01/22 7124   prenatal multivitamin tablet 1 tablet  1 tablet Oral Daily Revonda Humphrey, NP   1 tablet at 05/01/22 5809   Current Outpatient Medications  Medication Sig Dispense Refill   Prenatal Vit-Fe Fumarate-FA (PRENATAL MULTIVITAMIN) TABS tablet Take 1 tablet by mouth daily at 12 noon. Gummie Form is what pt uses at home     metroNIDAZOLE (METROGEL VAGINAL) 0.75 % vaginal gel Place 1 Applicatorful vaginally at bedtime. Insert one applicator, at bedtime, for 5 nights. 70 g 0   PTA Medications: Prior to Admission medications   Medication Sig Start Date End Date Taking? Authorizing Provider  Prenatal Vit-Fe Fumarate-FA (PRENATAL MULTIVITAMIN) TABS tablet Take 1 tablet by mouth daily at 12 noon. Gummie Form is what pt uses at home   Yes [provider]  metroNIDAZOLE (METROGEL VAGINAL) 0.75 % vaginal gel Place 1 Applicatorful vaginally at bedtime. Insert one applicator, at bedtime, for 5 nights. 04/21/22   [name], CNM    Patient Stressors: Financial difficulties   Loss of stable housing.   Substance abuse   Other: Pregnant; living in high-risk environment; ongoing substance use in the family who are not supportive in helping her maintain clean time; no transportation, no financial income/employment.    Patient Strengths: Ability for insight  Average or above average intelligence  Communication skills  General fund of knowledge  Motivation for treatment/growth  Supportive family/friends  Other: Responsibility to her children; supportive partner who wants to see her  succeed.  Treatment Modalities: Medication Management, Group therapy, Case management,  1 to 1 session with clinician, Psychoeducation, Recreational therapy.   Physician Treatment Plan for Primary and Secondary Diagnosis:  Final diagnoses:  Polysubstance abuse ([place])   Long Term Goal(s): Improvement in symptoms so as ready for discharge  Short Term Goals: Patient will verbalize feelings in meetings with treatment team members. Patient will attend at least of 50% of the groups daily. Pt will complete the PHQ9 on admission, day 3 and discharge. Patient will participate in completing the [place] Patient will score a low risk of violence for 24 hours prior to discharge Patient will take medications as prescribed daily.  Medication Management: Evaluate patient's response, side effects, and tolerance of medication regimen.  Therapeutic Interventions: 1 to 1 sessions, Unit Group sessions and Medication administration.  Evaluation of Outcomes: Progressing  LCSW Treatment Plan for Primary Diagnosis:  Final diagnoses:  Polysubstance abuse ([place])    Long Term Goal(s): Safe transition to appropriate next level of care at discharge.  Short Term Goals: Facilitate acceptance of mental health diagnosis and concerns through verbal commitment to aftercare plan and appointments at discharge., Patient will identify one social support prior to discharge to aid in patient's recovery., Patient will attend AA/NA groups as scheduled., Identify minimum of 2 triggers associated with mental health/substance abuse issues with treatment team members., and Increase skills for wellness and recovery by attending 50% of scheduled groups.  Therapeutic Interventions: Assess for all discharge needs, 1 to 1 time with Social worker, Explore available resources and support systems, Assess for adequacy in  community support network, Educate family and significant other(s) on suicide prevention,  Complete Psychosocial Assessment, Interpersonal group therapy.  Evaluation of Outcomes: Progressing   Progress in Treatment: Attending groups: Not yet. Participating in groups: Not yet. Taking medication as prescribed: Yes Toleration medication: Yes. Family/Significant other contact made: No, will contact:  None indicated. Patient understands diagnosis: Yes. Discussing patient identified problems/goals with staff: Yes. Medical problems stabilized or resolved: Yes. Denies suicidal/homicidal ideation: Yes. Issues/concerns per patient self-inventory: Yes. Other: Nothing indicated that has not been mentioned in the [facility].  New problem(s) identified: No, Describe:  n/a  New Short Term/Long Term Goal(s): "To re-establish myself."  Patient Goals:  Looking to maintain a healthy pregnancy; get into a residential program that can help her with substance use treatment as well as finding employment so she can get herself back on her feet.  Discharge Plan or Barriers: Ongoing substance use in the family making it difficult for her to rely on them for stable housing; no transportation; minimal support system.  Reason for Continuation of Hospitalization: Withdrawal symptoms   Estimated Length of Stay:  Last [place] Suicide Severity Risk Score: [place] [facility] from 04/29/2022 in [facility] Admission (Discharged) from 04/10/2022 in [facility] ED from 10/10/2021 in [place] No Risk No Risk No Risk       Last PHQ 2/9 Scores:    04/30/2022    2:11 PM 04/29/2022    5:48 PM  Depression screen PHQ 2/9  Decreased Interest 0 1  Down, Depressed, Hopeless 0 1  PHQ - 2 Score 0 2  Altered sleeping 0 1  Tired, decreased energy 0 1  Change in appetite 0 1  Feeling bad or failure about yourself  0 1  Trouble concentrating 0 1  Moving slowly or fidgety/restless 0 1  Suicidal thoughts 0  0  PHQ-9 Score 0 8  Difficult doing work/chores  Somewhat difficult    Scribe for Treatment Team: Kerri Perches, LCSW 05/01/2022 10:07 AM

## 2022-05-01 NOTE — Progress Notes (Signed)
Natasha Byrd ate lunch, completed her safety plan and afterwards took a nap. Later she c/o of mild contractions and pinkish tinge secretions when she cleanses her  vaginal  area. She stated the same symptoms occurred one week prior to her arrival to this facility. We talked one to one and she described her home as a drug traffic environment. We talked further and afterwards she was given a list of homes for unwed mothers. She spent one hour calling and found two possible places, one in [place] and [employer]. Her mood lifted afterwards.

## 2022-05-01 NOTE — ED Notes (Signed)
Pt is in the dayroom watching TV.  Respirations are even and unlabored. No acute distress noted. Will continue to monitor for safety. 

## 2022-05-01 NOTE — ED Notes (Signed)
Pt is in the bed sleeping. Respirations are even and unlabored. No acute distress noted. Will continue to monitor for safety. 

## 2022-05-01 NOTE — ED Notes (Signed)
Pt requested for medication to help with sleep.

## 2022-05-01 NOTE — Group Note (Signed)
Group Topic: Understanding Self  Group Date: 05/01/2022 Start Time: 1930 End Time: 2015 Facilitators: Shirlee More  Department: [facility]  Number of Participants: 3  Group Focus: personal responsibility Treatment Modality:  Solution-Focused Therapy Interventions utilized were support Purpose: regain self-worth  Name: Natasha Byrd Date of Birth: July 07, 1986  MR: [id number]    Level of Participation: active Quality of Participation: cooperative Interactions with others: gave feedback Mood/Affect: agitated and appropriate Triggers (if applicable): N/A Cognition: coherent/clear Progress: Gaining insight Response: Pt was active in MHT group Plan: patient will be encouraged to continue to work on self.  Patients Problems:  Patient Active Problem List   Diagnosis Date Noted   Polysubstance abuse ([place]) 04/30/2022   GBS bacteriuria 04/12/2022   Psychoses ([place])    Psychosis ([place]) 01/26/2015   Proteinuria in pregnancy, antepartum 12/19/2012   Urinary tract infection, site not specified 09/30/2012   Benzodiazepine abuse, continuous ([place]) 03/30/2012   Quit smoking 08/17/2008   ATTENTION DEFICIT, W/HYPERACTIVITY 10/11/2006   HEADACHE, UNSPECIFIED 10/11/2006

## 2022-05-02 DIAGNOSIS — F1120 Opioid dependence, uncomplicated: Secondary | ICD-10-CM | POA: Diagnosis not present

## 2022-05-02 DIAGNOSIS — F1290 Cannabis use, unspecified, uncomplicated: Secondary | ICD-10-CM | POA: Diagnosis present

## 2022-05-02 DIAGNOSIS — Z20822 Contact with and (suspected) exposure to covid-19: Secondary | ICD-10-CM | POA: Diagnosis not present

## 2022-05-02 DIAGNOSIS — F1910 Other psychoactive substance abuse, uncomplicated: Secondary | ICD-10-CM | POA: Diagnosis not present

## 2022-05-02 DIAGNOSIS — F909 Attention-deficit hyperactivity disorder, unspecified type: Secondary | ICD-10-CM | POA: Diagnosis not present

## 2022-05-02 DIAGNOSIS — F1420 Cocaine dependence, uncomplicated: Secondary | ICD-10-CM | POA: Diagnosis present

## 2022-05-02 DIAGNOSIS — Z3A01 Less than 8 weeks gestation of pregnancy: Secondary | ICD-10-CM

## 2022-05-02 MED ORDER — NALOXONE HCL 4 MG/0.1ML NA LIQD: 1.0000 | Freq: Once | NASAL | 0 refills | Status: AC

## 2022-05-02 NOTE — BH Specialist Note (Signed)
LCSW Progress Note  LCSW completed referral form for [facility] with [facility] and faxed it to [phone].  LCSW provided pt with copies of the referral forms to keep for her records or in case the residential facility did not receive them.  Follow-up appointment with Donata Duff, intake coordinator with [facility] at [place] was included in the pt's AVS.  Pt was briefed to be present at [facility] [place], 03 May 2022.  Pt acknowledged the instructions.   Omelia Blackwater, MSW, [place] [phone] phone

## 2022-05-02 NOTE — Discharge Instructions (Addendum)
Dear Natasha Byrd,  Most effective treatment for your mental health disease involves BOTH a psychiatrist AND a therapist Psychiatrist to manage medications Therapist to help identify personal goals, barriers from those goals, and plan to achieve those goals by understanding emotions Please make regular appointments with an outpatient psychiatrist and other doctors once you leave the hospital (if any, otherwise, please see below for resources to make an appointment).  For therapy outside the hospital, please ask for these specific types of therapy: DBT ________________________________________________________  SAFETY CRISIS  Dial 988 for [place]    Text [phone] for Crisis Text Line:     [facility]:  [address] FIRST FLOOR.  [place], [place] [address].  [phone]  Mobile Crisis Response Teams Listed by counties in vicinity of [place]. [phone] [place] [phone] [place] [phone] [facility] [place] [facility] [phone] [place] [phone] [place]. [phone] [place].  [place] [phone] ________________________________________________________  To see which pharmacy near you is the CHEAPEST for certain medications, please use GoodRx. It is free website and has a free phone app.    Also consider looking at [facility] $4.00 or [facility]'s $7.00 prescription list. Both are free to view if googled "[facility] $4 prescription" and "public's $7 prescription". These are set prices, no insurance required. [facility]'s low cost medications: $4-$15 for 30days  prescriptions or $10-$38 for 90days prescriptions  ________________________________________________________  Difficulties with sleep?   Can also use this free app for insomnia called CBT-I. Let your doctors and therapists know so they can help with extra tips and tricks or for guidance and accountability. NO ADDS on the app.     ________________________________________________________  Non-Emergent / Urgent  [facility] [address], [place], [place] [address] [phone] OUTPATIENT Walk-in information: Please note, all walk-ins are first come & first serve, with limited number of availability.  Please note that to be eligible for services you must bring: ID or a piece of mail with your name [facility] address  Therapist for therapy:  Monday & Wednesdays: Please ARRIVE at 7:15 AM for registration Will START at 8:00 AM Every 1st & 2nd Friday of the month: Please ARRIVE at 10:15 AM for registration Will START at 1 PM - 5 PM  Psychiatrist for medication management: Monday - Friday:  Please ARRIVE at 7:15 AM for registration Will START at 8:00 AM  Regretfully, due to limited availability, please be aware that you may not been seen on the same day as walk-in. Please consider making an appoint or try again. Thank you for your patience and understanding.

## 2022-05-02 NOTE — ED Provider Notes (Signed)
Attestation note Dr. Alfonse Spruce 05/02/2022 progress note:  I have reviewed the note by Dr. Alfonse Spruce, and I am in agreement with the assessment and plan.  I have independently interviewed and assessed the patient. In summary, Natasha Byrd 36 y.o., female patient presented to [facility] as a walk in requesting alcohol and opioid detox.  Reports she was referred by [facility] residential.  Patient meets criteria for admission to facility based crisis unit.  She is able to contract for safety while admitted.  She denies SI, HI, AVH  Shereta Crothers Donny Pique, MD
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
## 2022-05-02 NOTE — ED Provider Notes (Addendum)
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 breath.   Cardiovascular:  Negative for chest pain.  Gastrointestinal:  Negative for nausea and vomiting.  Neurological:  Negative for dizziness and headaches.  Psychiatric/Behavioral:  Negative for hallucinations and suicidal ideas. The patient does not have insomnia.    Blood pressure 100/62, pulse 71, temperature 98.3 F (36.8 C), temperature source Oral, resp. rate 18, height 5' 5" (1.651 m), weight 164 lb (74.4 kg), last menstrual period 03/14/2022, SpO2 100 %. Body mass index is 27.29 kg/m.  Treatment Plan Summary: Daily contact with patient to assess and evaluate symptoms and progress in treatment and Medication management  Shamere L Gumbs 36 y.o., female patient presented to [facility] as a walk in requesting alcohol and opioid detox.   Based on assessment today patient appears to be overall pleasant and motivated for sobriety.  Patient endorses cocaine and Percocet use however her UDS is negative for both of these items but is positive for [facility] which patient does endorse daily use up.  Regardless as patient is pregnant sobriety from all of these substances is recommended.  Patient has a low chance of displaying withdrawal symptoms from opioids or cocaine due to them no longer being in her urine despite endorsing use in the past 48 hours.  - Labs-urine pregnancy: Positive, UDS: Positive for THC, CBC: HCT 35.1, CMP: Glucose 116, A1c: 4.2, Mg: WNL, EtOH: Negative, lipid panel: WNL, TSH: WNL  Cannabis use disorder, severe Reported polysubstance abuse (stimulant-cocaine and opioid) - Continue to monitor patient for withdrawal -- COWS  Pregnancy - Prenatal vitamin - Folic acid  DISPO: Tentative date: 05/02/2022 Location: Home [facility]: 05/02/2022 14-day sample and 30-day prescription (written) with 1 month refill completed TB Quantiferon Gold Test Ordered on 05/02/2022 Physical Exam completed 05/02/2022  Signed: Merrily Brittle, DO Psychiatry  Resident, PGY-2 [facility] Crisis  05/02/2022, 10:38 AM

## 2022-05-02 NOTE — ED Notes (Signed)
Patient attended group in the courtyard, we discussed Anger and ways to deflect or prevent anger, provided workbooks for patient to read, we went thru them together, and discussed, different types of anger, sadness, shame, scared, threatened, anxiety, embarrassed, lonely, overwhelmed to name a few, Patient was very verbal in discussing her anger problems and how she deals with it. We discussed the warning signs of anger, like sweating, throwing things, becoming argumentative, scowling, becoming aggressive, and what we can do to avoid these feelings and if we cannot, how can we handle it so things do not get out of hand, you can walk away, think of positive things or better things you would rather do then argue, avoid people who want to argue and fight, to to change your atmosphere, talk to someone you care about to get a better perspective on things. I handed out work sheets they can work on themselves, what do you do when you are angry, do you cry, call names, post on social media, use foul language, slam doors, what is your coping mechanism. Finally we discussed family needs what does a healthy relationship look like, it could be someone you love, a friend or family member, with a worksheet. Patient was very forthcoming and friendly.

## 2022-05-02 NOTE — ED Notes (Signed)
Patient is awake and alert at this time.  She is focused on speaking with social worker who has not arrived yet.  Patient is scheduled to go to [facility] tomorrow however she wants to leave today and transport self to facility.  Patient claims she has positive support in her father and her baby daddy.  Will notify social worker when she arrives.  Will monitor patient.  No evidence of withdrawal.  No reports of cramping or vaginal bleeding.

## 2022-05-02 NOTE — ED Notes (Signed)
Pt is in the bed sleeping. Respirations are even and unlabored. No acute distress noted. Will continue to monitor for safety. 

## 2022-05-05 ENCOUNTER — Encounter (HOSPITAL_COMMUNITY): Payer: Self-pay | Admitting: Obstetrics and Gynecology

## 2022-05-05 ENCOUNTER — Inpatient Hospital Stay (HOSPITAL_COMMUNITY): Payer: Medicaid Other

## 2022-05-05 ENCOUNTER — Inpatient Hospital Stay (HOSPITAL_COMMUNITY)
Admission: AD | Admit: 2022-05-05 | Discharge: 2022-05-05 | Disposition: A | Discharge status: Home / Self Care | Payer: Medicaid Other | Attending: Obstetrics and Gynecology | Admitting: Obstetrics and Gynecology

## 2022-05-05 ENCOUNTER — Other Ambulatory Visit: Payer: Self-pay

## 2022-05-05 DIAGNOSIS — O208 Other hemorrhage in early pregnancy: Secondary | ICD-10-CM | POA: Diagnosis not present

## 2022-05-05 DIAGNOSIS — Z3A01 Less than 8 weeks gestation of pregnancy: Secondary | ICD-10-CM | POA: Insufficient documentation

## 2022-05-05 DIAGNOSIS — O23591 Infection of other part of genital tract in pregnancy, first trimester: Secondary | ICD-10-CM | POA: Insufficient documentation

## 2022-05-05 DIAGNOSIS — O09291 Supervision of pregnancy with other poor reproductive or obstetric history, first trimester: Secondary | ICD-10-CM | POA: Diagnosis not present

## 2022-05-05 DIAGNOSIS — O26891 Other specified pregnancy related conditions, first trimester: Secondary | ICD-10-CM | POA: Diagnosis not present

## 2022-05-05 DIAGNOSIS — O209 Hemorrhage in early pregnancy, unspecified: Secondary | ICD-10-CM | POA: Diagnosis not present

## 2022-05-05 DIAGNOSIS — R8271 Bacteriuria: Secondary | ICD-10-CM

## 2022-05-05 DIAGNOSIS — R4589 Other symptoms and signs involving emotional state: Secondary | ICD-10-CM

## 2022-05-05 LAB — URINALYSIS, ROUTINE W REFLEX MICROSCOPIC
Bilirubin Urine: NEGATIVE
Glucose, UA: NEGATIVE mg/dL
Hgb urine dipstick: NEGATIVE
Ketones, ur: NEGATIVE mg/dL
Leukocytes,Ua: NEGATIVE
Nitrite: NEGATIVE
Protein, ur: 30 mg/dL — AB
Specific Gravity, Urine: 1.025 (ref 1.005–1.030)
pH: 7 (ref 5.0–8.0)

## 2022-05-05 MED ORDER — ACETAMINOPHEN 500 MG PO TABS
1000.0000 mg | ORAL_TABLET | Freq: Once | ORAL | Status: AC
  Administered 2022-05-05: 1000 mg via ORAL
  Filled 2022-05-05: qty 2

## 2022-05-05 NOTE — MAU Note (Addendum)
At [place], CNM, called out for this RN to come into the room as the patient was very upset and crying uncontrollably.   Upon entering room the patient stated, "I'm going to kill myself" repeatedly. Patient then stated "I just want to do drugs and kill myself. Let me go, I need to go. Why is this happening? I don't want to be here." CNM and RN tried to console the patient. Patient continually yelling "I'm going to kill myself, I'm going to end it."   This RN stepped out of the room while CNM stayed in the room to call [facility] and inform her of the situation. [facility] stated she would come to MAU and that if the provider deems the patient needs to be IVC'd to step out and start the paperwork. RN stepped back into the room and CNM stepped out to place TTS order. RN placed order for suicide precautions and placed the order for Continuous Monitoring due to suicide concerns.   After calmly speaking with the patient she agreed to stay for TTS evaluation.   Mason Jim, NT, at bedside. NT cleaned out patient's room and sat with patient.

## 2022-05-05 NOTE — BH Assessment (Signed)
Comprehensive Clinical Assessment (CCA) Note  05/05/2022 Natasha Byrd [id number]  Chief Complaint:  Chief Complaint  Patient presents with   Vaginal Bleeding   Visit Diagnosis:   F33.2 Major depressive disorder, Recurrent episode, Severe F41.1 Generalized anxiety disorder  Flowsheet Row Admission (Current) from 05/05/2022 in [facility] 1S Maternity Assessment Unit ED from 04/29/2022 in [facility] Admission (Discharged) from 04/10/2022 in [facility] 1S Maternity Assessment Unit  C-SSRS RISK CATEGORY High Risk No Risk No Risk      The patient demonstrates the following risk factors for suicide: Chronic rise factors for suicide include: psychiatric disorder of major depressive disorder, anxiety disorder, substance use disorder, and medical illness pregnancy . Acute risk factors for suicide include: family or marital conflict and loss (financial, interpersonal, professional). Protective factors for this patient include: positive social support, positive therapeutic relationship, coping skills, hope for the future, and life satisfaction. Considering these factors, the overall suicide risk at this point appears to be high. Patient is not appropriate for outpatient follow up.  Disposition: Liborio Nixon NP, recommends pt to be psychiatric cleared and follow up with Behavioral Health Outpatient and Counseling Services.  Disposition discussed  with  V. Naval architect.  RN will discuss with EDP.  Natasha Byrd is a 36 year old female who presents voluntarily by  an [facility] to [facility]  and unaccompanied.  Pt refused TTS to contact Pt's grandmother, or any other relatives.  Pt denies SI, HI or AVH.  Pt reports that she made the statement "I don't want to be here, I should have kept my mouth closed".  When clinician asked if she wants to hurt herself today, pt reports "no".  Pt reports that she became upset when the nurse could not answer her question, "will my baby be alright, I am not seven weeks  pregnant, I am four weeks pregnant, they don't know".  Pt denies prior suicide attempts; also denies any history of intentional self harm.  Pt acknowledged the following symptoms: frustration, anxious, sadness, crying restlessness, worrying and irritable. Pt reports that she is sleeping eight or more hours daily.  Pt reports that she is eating five small meals daily.  Pt reports she smoke one or two blunts (marijuana) daily, " it helps me to calm down".  Pt denies using cocaine, and opioids, "I just said that so I could be accepted by [facility]".  Pt denies using alcohol.  Pt  identifies her primary  stressor as with not being able to carry this child, "I lost custody of two children due to five years incarceration, I don't want to lose this child, this child will give me a new direction.  Pt lives with her grandmother and reports that she works part time. Pt denies family MH history and denies family substance used.  Pt denies any  history of abuse or trauma.  Pt denies any current legal problems.  Pt denies any guns or weapons in the home.   Pt says she is not currently receiving weekly outpatient therapy, "I need therapy and I want to talk to somebody".  Pt reports  that she is receiving outpatient medication management.  Pt reports she she is pregnant.  Pt is dressed casual, alert, oriented x 4 with blunt speech and restless motor behavior.  Eye contact is good.  Pt's mood is depressed and affect is anxious.  Thought process is relevant.  Pt's insight is lacking and gaps.  Pt judgment is poor .  There is no  indication Pt is currently responding to internal stimuli or experiencing delusional thought content.     CCA Screening, Triage and Referral (STR)  Patient Reported Information How did you hear about [place]? Self  What Is the Reason for Your Visit/Call Today? Anxious  How Long Has This Been Causing You Problems? <Week  What Do You Feel Would Help You the Most Today? Treatment for Depression or  other mood problem; Alcohol or Drug Use Treatment; Housing Assistance; Stress Management; Transportation Assistance   Have You Recently Had Any Thoughts About [place]? No (Pt stated "i don't want to be here".)  Are You Planning to Commit Suicide/Harm Yourself At This time? No   Have you Recently Had Thoughts About [place]? No  Are You Planning to Harm Someone at This Time? No  Explanation: No data recorded  Have You Used Any Alcohol or Drugs in the Past 24 Hours? No (Pt reports last use of marijuana 9/14)  How Long Ago Did You Use Drugs or Alcohol? No data recorded What Did You Use and How Much? No data recorded  Do You Currently Have a Therapist/Psychiatrist? No  Name of Therapist/Psychiatrist: No data recorded  Have You Been Recently Discharged From Any Office Practice or Programs? No  Explanation of Discharge From Practice/Program: No data recorded    CCA Screening Triage Referral Assessment Type of Contact: Tele-Assessment  Telemedicine Service Delivery: Telemedicine service delivery: This service was provided via telemedicine using a 2-way, interactive audio and video technology  Is this Initial or Reassessment? Initial Assessment  Date Telepsych consult ordered in [facility]:  05/05/22  Time Telepsych consult ordered in [facility]:  No data recorded Location of Assessment: [facility] ED  Provider Location: [facility] Assessment Services   Collateral Involvement: No collateral involved.   Does Patient Have a Stage manager Guardian? No  Legal Guardian Contact Information: No data recorded Copy of Legal Guardianship Form: No data recorded Legal Guardian Notified of Arrival: No data recorded Legal Guardian Notified of Pending Discharge: No data recorded If Minor and Not Living with Parent(s), Who has Custody? n/a  Is CPS involved or ever been involved? Never  Is APS involved or ever been involved? Never   Patient Determined To Be At Risk for Harm To  Self or Others Based on Review of Patient Reported Information or Presenting Complaint? No (Pt reported "I don't want to be here anymore")  Method: No data recorded Availability of Means: No data recorded Intent: No data recorded Notification Required: No data recorded Additional Information for Danger to Others Potential: No data recorded Additional Comments for Danger to Others Potential: No data recorded Are There Guns or Other Weapons in Your Home? No data recorded Types of Guns/Weapons: No data recorded Are These Weapons Safely Secured?                            No data recorded Who Could Verify You Are Able To Have These Secured: No data recorded Do You Have any Outstanding Charges, Pending Court Dates, Parole/Probation? No data recorded Contacted To Inform of Risk of Harm To Self or Others: No data recorded   Does Patient Present under Involuntary Commitment? No  IVC Papers Initial File Date: No data recorded  [place] of Residence: [place]   Patient Currently Receiving the Following Services: Medication Management   Determination of Need: Urgent (48 hours)   Options For Referral: Medication Management; Outpatient Therapy  CCA Biopsychosocial Patient Reported Schizophrenia/Schizoaffective Diagnosis in Past: No   Strengths: uta   Mental Health Symptoms Depression:   Change in energy/activity; Difficulty Concentrating; Fatigue; Hopelessness; Increase/decrease in appetite; Sleep (too much or little); Tearfulness   Duration of Depressive symptoms:    Mania:   None   Anxiety:    Irritability; Restlessness; Sleep; Worrying; Tension; Fatigue   Psychosis:   None   Duration of Psychotic symptoms:    Trauma:   Re-experience of traumatic event; Avoids reminders of event; Guilt/shame   Obsessions:   None   Compulsions:   None   Inattention:   N/A   Hyperactivity/Impulsivity:   N/A   Oppositional/Defiant Behaviors:   N/A   Emotional Irregularity:    Potentially harmful impulsivity; Intense/unstable relationships   Other Mood/Personality Symptoms:   uta    Mental Status Exam Appearance and self-care  Stature:   Average   Weight:   Average weight   Clothing:   Casual; Careless/inappropriate   Grooming:   Normal   Cosmetic use:   Age appropriate   Posture/gait:   Normal   Motor activity:   Not Remarkable; Restless   Sensorium  Attention:   Persistent   Concentration:   Anxiety interferes   Orientation:   Situation; Place; Person; Object   Recall/memory:   Normal   Affect and Mood  Affect:   Anxious; Blunted   Mood:   Anxious; Irritable; Hopeless; Angry; Euthymic   Relating  Eye contact:   Normal   Facial expression:   Constricted; Depressed   Attitude toward examiner:   Cooperative; Guarded   Thought and Language  Speech flow:  Clear and Coherent; Paucity; Slow; Soft   Thought content:   Appropriate to Mood and Circumstances   Preoccupation:   None   Hallucinations:   None   Organization:  No data recorded  [employer] of Knowledge:   Average   Intelligence:   Average   Abstraction:   Functional   Judgement:   Poor   Reality Testing:   Adequate   Insight:   Gaps; Lacking   Decision Making:   Impulsive   Social Functioning  Social Maturity:   Impulsive   Social Judgement:   Heedless   Stress  Stressors:   Other (Comment); Relationship; Financial; Housing (Newly discovered pregnancy)   Coping Ability:   Deficient supports; Exhausted; Overwhelmed   Skill Deficits:   Decision making; Self-care; Self-control (uta)   Supports:   Family; Friends/Service system; Support needed     Religion: Religion/Spirituality Are You A Religious Person?: Yes What is Your Religious Affiliation?: Christian How Might This Affect Treatment?: Ephriam Knuckles supports her faith  Leisure/Recreation: Leisure / Recreation Do You Have Hobbies?: Yes Leisure and  Hobbies: shopping  Exercise/Diet: Exercise/Diet Do You Exercise?: No Have You Gained or Lost A Significant Amount of Weight in the Past Six Months?: No Do You Follow a Special Diet?: No Do You Have Any Trouble Sleeping?: No   CCA Employment/Education Employment/Work Situation: Employment / Work Situation Employment Situation: Unemployed Patient's Job has Been Impacted by Current Illness: No Has Patient ever Been in Equities trader?: No  Education: Education Is Patient Currently Attending School?: No Last Grade Completed: 9 Did You Product manager?: No Did You Have An Individualized Education Program (IIEP): No (uta) Did You Have Any Difficulty At School?: Yes (trouble getting along with others) Were Any Medications Ever Prescribed For These Difficulties?: No Patient's Education Has Been Impacted by Current Illness: No  CCA Family/Childhood History Family and Relationship History: Family history Marital status: Single Does patient have children?: Yes How many children?: 2 (Ages 71 and 92 yo- Both were taken from her custody when she was incarcerated.) How is patient's relationship with their children?: distance  Childhood History:  Childhood History By whom was/is the patient raised?: Foster parents, Adoptive parents Did patient suffer any verbal/emotional/physical/sexual abuse as a child?: No Did patient suffer from severe childhood neglect?: No Has patient ever been sexually abused/assaulted/raped as an adolescent or adult?: No Was the patient ever a victim of a crime or a disaster?: No Witnessed domestic violence?: No Has patient been affected by domestic violence as an adult?: No  Child/Adolescent Assessment:     CCA Substance Use Alcohol/Drug Use: Alcohol / Drug Use Pain Medications: None  Prescriptions: None  Over the Counter: None  History of alcohol / drug use?: Yes Longest period of sobriety (when/how long): None--Past hx of alcohol and THC use in teens  unknown if pt is still using  Negative Consequences of Use: Personal relationships Withdrawal Symptoms: Nausea / Vomiting, Blackouts, Cramps, Weakness, Irritability Substance #1 Name of Substance 1: Marijuana 1 - Age of First Use: 20 1 - Amount (size/oz): 1 or 2 blunts daily 1 - Frequency: daily 1 - Duration: ongoing 1 - Last Use / Amount: 04/27/22 1 - Method of Aquiring: UTA 1- Route of Use: smoking Substance #2 2 - Age of First Use: 33 2 - Amount (size/oz): varies 2 - Frequency: daily until recently when she has been trying to cut back 2 - Duration: ongoing 2 - Last Use / Amount: yesterday Substance #3 Name of Substance 3: cannabis 3 - Age of First Use: 18 3 - Amount (size/oz): varies 3 - Frequency: daily until recently when she has been trying to cut back 3 - Last Use / Amount: today Substance #4 Name of Substance 4: alcohol 4 - Age of First Use: 24 4 - Amount (size/oz): varies 4 - Frequency: daily until recently when she has been trying to cut back 4 - Duration: ongoing 4 - Last Use / Amount: 04/10/22 when she foiund out she was pregnant                 ASAM's:  Six Dimensions of Multidimensional Assessment  Dimension 1:  Acute Intoxication and/or Withdrawal Potential:   Dimension 1:  Description of individual's past and current experiences of substance use and withdrawal: no withdrawal sx reported  Dimension 2:  Biomedical Conditions and Complications:   Dimension 2:  Description of patient's biomedical conditions and  complications: no condition reported other than pregnancy  Dimension 3:  Emotional, Behavioral, or Cognitive Conditions and Complications:  Dimension 3:  Description of emotional, behavioral, or cognitive conditions and complications: Hx of MDD and GAD  Dimension 4:  Readiness to Change:  Dimension 4:  Description of Readiness to Change criteria: Ready to change reported  Dimension 5:  Relapse, Continued use, or Continued Problem Potential:  Dimension  5:  Relapse, continued use, or continued problem potential critiera description: continued use at the moment despite wanting to stop  Dimension 6:  Recovery/Living Environment:  Dimension 6:  Recovery/Iiving environment criteria description: pt described staying in various family's homes  ASAM Severity Score: ASAM's Severity Rating Score: 12  ASAM Recommended Level of Treatment: ASAM Recommended Level of Treatment: Level III Residential Treatment   Substance use Disorder (SUD) Substance Use Disorder (SUD)  Checklist Symptoms of Substance Use: Continued use despite having a  persistent/recurrent physical/psychological problem caused/exacerbated by use, Continued use despite persistent or recurrent social, interpersonal problems, caused or exacerbated by use, Persistent desire or unsuccessful efforts to cut down or control use, Presence of craving or strong urge to use, Recurrent use that results in a failure to fulfill major role obligations (work, school, home), Social, occupational, recreational activities given up or reduced due to use  Recommendations for Services/Supports/Treatments: Recommendations for Services/Supports/Treatments Recommendations For Services/Supports/Treatments: Individual Therapy  Discharge Disposition:    DSM5 Diagnoses: Patient Active Problem List   Diagnosis Date Noted   Opioid use disorder, severe, dependence (HCC) 05/02/2022   Cannabis use disorder 05/02/2022   Cocaine use disorder, severe, dependence (HCC) 05/02/2022   Less than [redacted] weeks gestation of pregnancy 05/02/2022   Polysubstance abuse (HCC) 04/30/2022   GBS bacteriuria 04/12/2022   Psychoses (HCC)    Psychosis (HCC) 01/26/2015   Proteinuria in pregnancy, antepartum 12/19/2012   Urinary tract infection, site not specified 09/30/2012   Benzodiazepine abuse, continuous (HCC) 03/30/2012   Quit smoking 08/17/2008   ATTENTION DEFICIT, W/HYPERACTIVITY 10/11/2006   HEADACHE, UNSPECIFIED 10/11/2006      Referrals to Alternative Service(s): Referred to Alternative Service(s):   Place:   Date:   Time:    Referred to Alternative Service(s):   Place:   Date:   Time:    Referred to Alternative Service(s):   Place:   Date:   Time:    Referred to Alternative Service(s):   Place:   Date:   Time:     Meryle Readyijuana  Zyen Triggs, Counselor

## 2022-05-05 NOTE — MAU Note (Addendum)
X1 Attempt to contact TTS at 1228. X2 Attempt at 1230 X3 Attempt at 1231  Page sent at 1232 to ACT Team.  X4 Attempt at 1235 to contact TTS. Transferred twice with no result. Continual ringing.  X5 Attempt at 1240. Spoke with Gwenn at the front desk who took down this RN's name and number and stated she would give it to TTS to call back.

## 2022-05-05 NOTE — MAU Provider Note (Signed)
History     CSN: [id number]  Arrival date and time: 05/05/22 3267   Event Date/Time   First Provider Initiated Contact with Patient 05/05/22 [phone]      Chief Complaint  Patient presents with   Vaginal Bleeding   HPI Natasha Byrd is a 36 y.o. G3P2002 at [redacted]w[redacted]d by LMP who presents to MAU for vaginal bleeding. Patient reports this morning when she woke up she noticed some pink spotting when she wiped as well as a "small speck" in the toilet. She has not had anymore since then. She reports she had IC 2 days ago. She was also recently diagnosed with BV however was unable to pick up her prescription as she has transportation issues. She reports very mild cramping that she rates 1/10. She denies urinary s/s, itching or odor. She reports that this is a highly desired pregnancy and she endorses that she is very anxious about the outcome of this pregnancy. She reports she wants to "get her life together" so that she can do right for this baby. She recently started [facility] however reports they dropped her because of transportation issues.   OB History     Gravida  3   Para  2   Term  2   Preterm  0   AB  0   Living  2      SAB  0   IAB  0   Ectopic  0   Multiple      Live Births  2        Obstetric Comments  Hx Pre- eclampsia         Past Medical History:  Diagnosis Date   Attention deficit disorder with hyperactivity    Benzodiazepine abuse ([place])    Chlamydia    as teen   Depression    doing good now   Headache    History of suicidal ideation    Homicidal ideations    History of,   UTI (urinary tract infection)     Past Surgical History:  Procedure Laterality Date   TONSILLECTOMY      Family History  Problem Relation Age of Onset   Abnormal EKG Mother    Hypertension Mother    Sleep apnea Father    Hypertension Brother    Hypertension Maternal Grandmother     Social History   Tobacco Use   Smoking status: Former    Packs/day: 0.30    Types:  Cigarettes   Smokeless tobacco: Never   Tobacco comments:    Quit smoking cig Spring 2023  Vaping Use   Vaping Use: Never used  Substance Use Topics   Alcohol use: Not Currently    Alcohol/week: 4.0 standard drinks of alcohol    Types: 4 Cans of beer per week    Comment: 2 yrs ago   Drug use: Not Currently    Types: Marijuana    Comment: none since incarcerated (~2020)    Allergies: No Known Allergies  Medications Prior to Admission  Medication Sig Dispense Refill Last Dose   Prenatal Vit-Fe Fumarate-FA (PRENATAL MULTIVITAMIN) TABS tablet Take 1 tablet by mouth daily at 12 noon. Gummie Form is what pt uses at home 30 tablet 1 Past Week   folic acid (FOLVITE) 1 MG tablet Take 1 tablet (1 mg total) by mouth daily. 30 tablet 1    metroNIDAZOLE (METROGEL VAGINAL) 0.75 % vaginal gel Place 1 Applicatorful vaginally at bedtime. Insert one applicator, at bedtime, for 5 nights.  70 g 1    Review of Systems  Constitutional: Negative.   Respiratory: Negative.    Cardiovascular: Negative.   Gastrointestinal:  Positive for abdominal pain (cramping).  Genitourinary:  Positive for vaginal bleeding (spotting). Negative for dysuria and frequency.  Musculoskeletal: Negative.   Neurological: Negative.    Physical Exam   Blood pressure 126/79, pulse 82, temperature 98.3 F (36.8 C), temperature source Oral, resp. rate 19, height 5\' 5"  (1.651 m), weight 73.3 kg, last menstrual period 03/17/2022, SpO2 100 %.  Physical Exam Vitals and nursing note reviewed.  Constitutional:      General: She is not in acute distress. Eyes:     Pupils: Pupils are equal, round, and reactive to light.  Cardiovascular:     Rate and Rhythm: Normal rate.  Pulmonary:     Effort: Pulmonary effort is normal.  Abdominal:     Palpations: Abdomen is soft.     Tenderness: There is no abdominal tenderness.  Musculoskeletal:        General: Normal range of motion.     Cervical back: Normal range of motion.  Skin:     General: Skin is warm and dry.  Neurological:     General: No focal deficit present.     Mental Status: She is alert and oriented to person, place, and time.  Psychiatric:        Mood and Affect: Mood is anxious.        Behavior: Behavior normal.   05/17/2022 OB Transvaginal  Result Date: 05/05/2022 CLINICAL DATA:  Cramping/bleeding early EXAM: TRANSVAGINAL OB ULTRASOUND TECHNIQUE: Transvaginal ultrasound was performed for complete evaluation of the gestation as well as the maternal uterus, adnexal regions, and pelvic cul-de-sac. COMPARISON:  Ultrasound 04/21/2022. FINDINGS: Intrauterine gestational sac: Single Yolk sac:  Visualized, borderline enlarged measuring 6-8 mm. Embryo:  Not Visualized. Cardiac Activity: Not Visualized. Heart Rate: Not applicable MSD: 11.4 mm   5 w   6 d, MSD previously measured 7.7 mm. Subchorionic hemorrhage:  Small Maternal uterus/adnexae: Normal ovaries. IMPRESSION: Mildly increased size of the intrauterine gestational sac with mean sac diameter 11.4 mm, previously 7.7 mm. Borderline enlarged yolk sac. No embryo with cardiac activity identified. Small subchorionic hemorrhage. Findings are suspicious but not yet definitive for failed pregnancy. Recommend follow-up 06/21/2022 in 10-14 days for definitive diagnosis. This recommendation follows SRU consensus guidelines: Diagnostic Criteria for Nonviable Pregnancy Early in the First Trimester. [birth year]-1979 Med [id number]. Electronically Signed   By: ; [id number] M.D.   On: 05/05/2022 09:53    MAU Course  Procedures  MDM UA Ultrasound SW consult TTS  While waiting for ultrasound results, SW at bedside to provide patient with resources regarding housing, transportation, etc. She has a history of polysubstance use and does not have a good support system although her mother and sister are around. She reports she was also dropped from [facility] for transportation issues. See SW consult note.  1115: I reviewed with patient that today's  ultrasound was highly suspicious but not definite for failed pregnancy. Will repeat ultrasound in 10 days for confirmation. As I was reviewing ultrasound results, patient started screaming/crying that she "does not want to live anymore, her life does not have a purpose and that she is going to go home and use a bunch of drugs and kill herself". RN at bedside with CNM. After calmly speaking with patient, she agrees to stay for TTS evaluation.   1230: Patient started to walk off unit saying that she  was leaving. She says "that she is not going to go kill herself and that everything she said prior was just because she was upset". RN present for conversation as well. I discussed with patient that I would like to have TTS evaluate and ensure that she was safe, had appropriate resources, and can make a safety plan. She agrees to wait for TTS and walked back to room with CNM and RN.   1455: TTS assessment of patient complete. See separate note for further details. Spoke with [employer] and Katherine Mantle, NP. Patient does not meet criteria for inpatient treatment and is stable for outpatient management and follow up for psychiatric services. She was given resources and a safety plan was developed. Patient adamantly reports that she is not going to hurt herself and continues to reiterate  Assessment and Plan  [redacted] weeks gestation of pregnancy Vaginal bleeding affecting pregnancy Complaint of feeling depressed  - Discharge home with strict return precautions - Patient is aware of safety plan and where to seek outpatient behavioral health resources/follow up - Patient to return to MAU or ED for any emergencies - Follow up ultrasound in 10 days. Message sent to [facility] to reschedule.   Brand Males, CNM 05/05/2022, 5:16 PM

## 2022-05-05 NOTE — MAU Note (Addendum)
Falencio with TTS called this RN at [employer]. He stated he would attempt to see the patient as soon as he could. Unable to give this RN a timeframe of when the patient would be assessed.

## 2022-05-05 NOTE — MAU Note (Signed)
Natasha Byrd is a 36 y.o. at [redacted]w[redacted]d here in MAU reporting: she's having spotting with wiping that began intermittently 2 days ago.  Reports passed 1 small clot. Reports mild abdominal cramping.  Endorses last intercourse 2 days ago. LMP: 03/19/2022 Onset of complaint: 2 days ago. Pain score: 1 Vitals:   05/05/22 0811  BP: (!) 113/59  Pulse: 75  Resp: 19  Temp: 98.3 F (36.8 C)  SpO2: 100%     FHT:N/A Lab orders placed from triage:   UA

## 2022-05-05 NOTE — Social Work (Signed)
CSW received consult for resources. CSW met with MOB to offer support and complete assessment.    CSW met with MOB at bedside and introduced role. MOB presented pleasant and very receptive to [employer] visit. MOB was forthcoming with information and stated that she has been "trying to find a place for myself." MOB shared that she currently lives with her grandmother which she is feels is not a good environment for her, "there are drugs and other stuff, she is not your typical church grandma." MOB reported she has called several housing places and has been wait listed for all of them including Rooms at the [place]. MOB reported she call the housing authority and they are not currently accepting new applications. MOB reported she has not used drugs since August after learning about the pregnancy and declined substance use resources. CSW inquired about MOB admission at [facility]. MOB reported she was staying there for free housing. MOB stated, "I want to get my life together." She does not have family support and the father of baby is involved "on and off." MOB expressed she is anxious about her lab work and "if everything is okay with the baby." CSW acknowledged MOB concerns and offered emotional support. CSW acknowledged MOB efforts to "get her life together." MOB stated this morning she was on the phone with a case manager trying to enroll into the "Baby Love Program" through [facility] for maternal support services. MOB reported that she also starts a new job next week with at "[employer]." MOB stated she will continue [facility] services at [facility] and understands that she can use Medicaid transportation for the appointments. CSW provided the contact information for Medicaid heathy blue non-emergency transportation. MOB denied food insecurities as she currently receives food stamps and will apply for [facility]. MOB expressed that she has been feeling a lot of stress. CSW inquired if MOB is enrolled in current mental health services. MOB  reported that she is interested in seeing a counselor. CSW provided MOB with a list of mental health providers and discussed the importance of following up. MOB reported understanding. CSW assessed MOB for additional needs. MOB reported no further need and expressed appreciation for CSW visit.   CSW identifies no further need for intervention and no barriers to discharge at this time.  Kathrin Greathouse, MSW, LCSW Women's and [place] Worker  [phone] 05/05/2022  12:33 PM

## 2022-05-05 NOTE — Discharge Instructions (Addendum)
Discharge recommendations:   Please see information for follow-up appointment with psychiatry and therapy.  Please follow up with your primary care provider for all medical related needs.    Therapy: We recommend that patient participate in individual therapy to address mental health concerns.  Safety:  The patient should abstain from use of illicit substances/drugs and abuse of any medications. If symptoms worsen or do not continue to improve or if the patient becomes actively suicidal or homicidal then it is recommended that the patient return to the closest hospital emergency department, the [facility], or call 911 for further evaluation and treatment. National Suicide Prevention Lifeline 1-800-SUICIDE or 615-875-2545.  About 988 988 offers 24/7 access to trained crisis counselors who can help people experiencing mental health-related distress. People can call or text 988 or chat 988lifeline.org for themselves or if they are worried about a loved one who may need crisis support.   You are encouraged to follow up with [facility] for outpatient treatment.  Walk in/ Open Access Hours: Monday - Friday 8AM - 11AM (To see provider and therapist) Friday - [place] (To see therapist only)  [facility] [address] [place], [place]

## 2022-05-05 NOTE — MAU Note (Signed)
Elmyra Ricks, LCSW, at bedside.

## 2022-05-05 NOTE — MAU Note (Signed)
TTS on call with patient 

## 2022-05-05 NOTE — MAU Note (Signed)
Called [place], [place], and notified her that the patient has returned from [place] and is ready to be seen.

## 2022-05-05 NOTE — MAU Note (Signed)
At 1230 patient come out of the room stating, "I need to get some fresh air, where do I go? I have got to get out of here. I cannot wait any more." RN called for CNM and walked towards the patient as she went through the double doors to try and exit. Patient stopped in the hallway and stated "I am not going to kill myself. Everything I said was just because I was upset. People say things when they are upset." RN and CNM discussed that we were waiting on TTS and that we desire for her to be safe. Patient agreed to stay again. Patient walked back to her room with NT at bedside. Menu given to patient for patient to order a meal.

## 2022-05-11 ENCOUNTER — Encounter: Payer: Self-pay | Admitting: Obstetrics and Gynecology

## 2022-05-11 ENCOUNTER — Ambulatory Visit
Admission: RE | Admit: 2022-05-11 | Discharge: 2022-05-11 | Disposition: A | Discharge status: Home / Self Care | Payer: Medicaid Other | Source: Ambulatory Visit | Attending: Obstetrics and Gynecology | Admitting: Obstetrics and Gynecology

## 2022-05-11 ENCOUNTER — Ambulatory Visit (INDEPENDENT_AMBULATORY_CARE_PROVIDER_SITE_OTHER): Payer: Medicaid Other | Admitting: Obstetrics and Gynecology

## 2022-05-11 DIAGNOSIS — Z3480 Encounter for supervision of other normal pregnancy, unspecified trimester: Secondary | ICD-10-CM | POA: Diagnosis not present

## 2022-05-11 DIAGNOSIS — O3680X0 Pregnancy with inconclusive fetal viability, not applicable or unspecified: Secondary | ICD-10-CM | POA: Diagnosis not present

## 2022-05-11 DIAGNOSIS — N939 Abnormal uterine and vaginal bleeding, unspecified: Secondary | ICD-10-CM | POA: Diagnosis not present

## 2022-05-11 DIAGNOSIS — Z3A01 Less than 8 weeks gestation of pregnancy: Secondary | ICD-10-CM | POA: Diagnosis not present

## 2022-05-11 DIAGNOSIS — O26891 Other specified pregnancy related conditions, first trimester: Secondary | ICD-10-CM | POA: Diagnosis not present

## 2022-05-11 DIAGNOSIS — O209 Hemorrhage in early pregnancy, unspecified: Secondary | ICD-10-CM | POA: Diagnosis not present

## 2022-05-11 NOTE — Progress Notes (Signed)
Ultrasounds Results Note  SUBJECTIVE HPI:  Natasha Byrd is a 36 y.o. G3P2002 at [redacted]w[redacted]d by LMP who presents to the [facility] for followup ultrasound results. The patient denies abdominal pain or vaginal bleeding.  Upon review of the patient's records, patient was first seen in MAU on 8/28 where an empty uterus was noted with a positive pregnancy test. Repeat ultrasound on 9/22 showed an empty gestational sac with yolk sac.  Repeat ultrasound was performed earlier today.   Past Medical History:  Diagnosis Date   Attention deficit disorder with hyperactivity    Benzodiazepine abuse ([place])    Chlamydia    as teen   Depression    doing good now   Headache    History of suicidal ideation    Homicidal ideations    History of,   UTI (urinary tract infection)    Past Surgical History:  Procedure Laterality Date   TONSILLECTOMY     Social History   Socioeconomic History   Marital status: Single    Spouse name: Not on file   Number of children: Not on file   Years of education: Not on file   Highest education level: Not on file  Occupational History   Not on file  Tobacco Use   Smoking status: Former    Packs/day: 0.30    Types: Cigarettes   Smokeless tobacco: Never   Tobacco comments:    Quit smoking cig Spring 2023  Vaping Use   Vaping Use: Never used  Substance and Sexual Activity   Alcohol use: Not Currently    Alcohol/week: 4.0 standard drinks of alcohol    Types: 4 Cans of beer per week    Comment: 2 yrs ago   Drug use: Not Currently    Types: Marijuana    Comment: none since incarcerated (~2020)   Sexual activity: Yes    Birth control/protection: None  Other Topics Concern   Not on file  Social History Narrative   ** Merged History Encounter **       Social Determinants of Health   Financial Resource Strain: Not on file  Food Insecurity: Not on file  Transportation Needs: Not on file  Physical Activity: Not on file  Stress: Not on file   Social Connections: Not on file  Intimate Partner Violence: Not on file   Current Outpatient Medications on File Prior to Visit  Medication Sig Dispense Refill   folic acid (FOLVITE) 1 MG tablet Take 1 tablet (1 mg total) by mouth daily. 30 tablet 1   metroNIDAZOLE (METROGEL VAGINAL) 0.75 % vaginal gel Place 1 Applicatorful vaginally at bedtime. Insert one applicator, at bedtime, for 5 nights. 70 g 1   Prenatal Vit-Fe Fumarate-FA (PRENATAL MULTIVITAMIN) TABS tablet Take 1 tablet by mouth daily at 12 noon. Gummie Form is what pt uses at home 30 tablet 1   No current facility-administered medications on file prior to visit.   No Known Allergies  I have reviewed patient's Past Medical Hx, Surgical Hx, Family Hx, Social Hx, medications and allergies.   Review of Systems Review of Systems  Constitutional: Negative for fever and chills.  Gastrointestinal: Negative for nausea, vomiting, abdominal pain, diarrhea and constipation.  Genitourinary: Negative for dysuria.  Musculoskeletal: Negative for back pain.  Neurological: Negative for dizziness and weakness.    Physical Exam  LMP 03/17/2022 (Approximate)   GENERAL: Well-developed, well-nourished female in no acute distress.  HEENT: Normocephalic, atraumatic.   LUNGS: Effort normal ABDOMEN: soft,  non-tender HEART: Regular rate  SKIN: Warm, dry and without erythema PSYCH: Normal mood and affect NEURO: Alert and oriented x 4  LAB RESULTS No results found for this or any previous visit (from the past 24 hour(s)).  IMAGING US OB LESS THAN 14 WEEKS WITH OB TRANSVAGINAL  Result Date: 05/11/2022 CLINICAL DATA:  Vaginal bleeding. EXAM: OBSTETRIC <14 WK [place] AND TRANSVAGINAL OB US TECHNIQUE: Both transabdominal and transvaginal ultrasound examinations were performed for complete evaluation of the gestation as well as the maternal uterus, adnexal regions, and pelvic cul-de-sac. Transvaginal technique was performed to assess early pregnancy.  COMPARISON:  05/05/2022 FINDINGS: Intrauterine gestational sac: Present Yolk sac: Prominent yolk sac measuring 12 mm. Similar appearance on prior ultrasound. Embryo:  None Cardiac Activity: N/A Heart Rate: N/A bpm MSD: 19 mm   6 w   6 d Subchorionic hemorrhage:  None visualized. Maternal uterus/adnexae: Normal ovaries. IMPRESSION: Persistent empty gestational sac. Estimated mean sac diameter gestational age is 6 weeks and 6 days but no embryonic pole is identified. There is a persistent large yolk sac. Findings are most consistent with an anembryonic pregnancy (blighted ovum). Electronically Signed   By: Rudie Meyer M.D.   On: 05/11/2022 10:51   US OB Transvaginal  Result Date: 05/05/2022 CLINICAL DATA:  Cramping/bleeding early EXAM: TRANSVAGINAL OB ULTRASOUND TECHNIQUE: Transvaginal ultrasound was performed for complete evaluation of the gestation as well as the maternal uterus, adnexal regions, and pelvic cul-de-sac. COMPARISON:  Ultrasound 04/21/2022. FINDINGS: Intrauterine gestational sac: Single Yolk sac:  Visualized, borderline enlarged measuring 6-8 mm. Embryo:  Not Visualized. Cardiac Activity: Not Visualized. Heart Rate: Not applicable MSD: 11.4 mm   5 w   6 d, MSD previously measured 7.7 mm. Subchorionic hemorrhage:  Small Maternal uterus/adnexae: Normal ovaries. IMPRESSION: Mildly increased size of the intrauterine gestational sac with mean sac diameter 11.4 mm, previously 7.7 mm. Borderline enlarged yolk sac. No embryo with cardiac activity identified. Small subchorionic hemorrhage. Findings are suspicious but not yet definitive for failed pregnancy. Recommend follow-up US in 10-14 days for definitive diagnosis. This recommendation follows SRU consensus guidelines: Diagnostic Criteria for Nonviable Pregnancy Early in the First Trimester. Malva Limes Med 2013; [id number]:3382-50. Electronically Signed   By: Caprice Renshaw M.D.   On: 05/05/2022 09:53   US OB Transvaginal  Result Date: 04/21/2022 CLINICAL DATA:   Vaginal bleeding; beta HCG in process at the time of dictation EXAM: TRANSVAGINAL OB ULTRASOUND TECHNIQUE: Transvaginal ultrasound was performed for complete evaluation of the gestation as well as the maternal uterus, adnexal regions, and pelvic cul-de-sac. COMPARISON:  Ultrasound 04/10/2022. FINDINGS: Intrauterine gestational sac: Single Yolk sac:  Visualized. Embryo:  Not Visualized. Cardiac Activity: Not Visualized. Heart Rate: Not visualized MSD: 7.7 mm   5 w   4 d Subchorionic hemorrhage:  None visualized. Maternal uterus/adnexae: Single gestational sac with yolk sac within the uterus. Unremarkable appearance of the bilateral adnexa with color Doppler flow in both ovaries. IMPRESSION: Intrauterine gestational sac with yolk sac. Mean sac diameter of 7.7 mm consistent with 5 week 4 day gestational age. No unexpected finding. Recommend close clinical follow-up with repeat obstetric scan as warranted by beta HCG and clinical assessment. Electronically Signed   By: Minerva Fester M.D.   On: 04/21/2022 21:17    ASSESSMENT 1. [redacted] weeks gestation of pregnancy     PLAN Discharge home in stable condition Ultrasound results reviewed with the patient and no progression between 9/22 and 9/28 ultrasound highly concerning for a failed pregnancy Patient states conception  date was on 04/02/22. She feels that it may be too early to detect a normal pregnancy Will order another ultrasound next week per patient request  Naamah Boggess, Gigi Gin, MD  05/11/2022  12:01 PM

## 2022-05-12 ENCOUNTER — Inpatient Hospital Stay (HOSPITAL_COMMUNITY)
Admission: AD | Admit: 2022-05-12 | Discharge: 2022-05-12 | Disposition: A | Discharge status: Home / Self Care | Payer: Medicaid Other | Attending: Obstetrics and Gynecology | Admitting: Obstetrics and Gynecology

## 2022-05-12 ENCOUNTER — Other Ambulatory Visit: Payer: Self-pay | Admitting: Student

## 2022-05-12 ENCOUNTER — Inpatient Hospital Stay (HOSPITAL_COMMUNITY): Payer: Medicaid Other

## 2022-05-12 ENCOUNTER — Encounter (HOSPITAL_COMMUNITY): Payer: Self-pay | Admitting: Obstetrics and Gynecology

## 2022-05-12 DIAGNOSIS — Z3A01 Less than 8 weeks gestation of pregnancy: Secondary | ICD-10-CM | POA: Diagnosis not present

## 2022-05-12 DIAGNOSIS — N939 Abnormal uterine and vaginal bleeding, unspecified: Secondary | ICD-10-CM | POA: Diagnosis not present

## 2022-05-12 DIAGNOSIS — Z3A08 8 weeks gestation of pregnancy: Secondary | ICD-10-CM

## 2022-05-12 DIAGNOSIS — O039 Complete or unspecified spontaneous abortion without complication: Secondary | ICD-10-CM

## 2022-05-12 DIAGNOSIS — O209 Hemorrhage in early pregnancy, unspecified: Secondary | ICD-10-CM | POA: Diagnosis not present

## 2022-05-12 DIAGNOSIS — O26891 Other specified pregnancy related conditions, first trimester: Secondary | ICD-10-CM | POA: Diagnosis not present

## 2022-05-12 MED ORDER — OXYCODONE-ACETAMINOPHEN 5-325 MG PO TABS: 1.0000 | ORAL_TABLET | Freq: Four times a day (QID) | ORAL | 0 refills | Status: AC | PRN

## 2022-05-12 MED ORDER — MISOPROSTOL 200 MCG PO TABS: 800.0000 ug | ORAL_TABLET | ORAL | 1 refills | Status: DC | PRN

## 2022-05-12 MED ORDER — IBUPROFEN 600 MG PO TABS: 600.0000 mg | ORAL_TABLET | Freq: Four times a day (QID) | ORAL | 0 refills | Status: DC | PRN

## 2022-05-12 MED ORDER — HYDROMORPHONE HCL 1 MG/ML IJ SOLN
1.0000 mg | Freq: Once | INTRAMUSCULAR | Status: AC
  Administered 2022-05-12: 1 mg via INTRAMUSCULAR
  Filled 2022-05-12: qty 1

## 2022-05-12 MED ORDER — OXYCODONE-ACETAMINOPHEN 5-325 MG PO TABS
2.0000 | ORAL_TABLET | Freq: Once | ORAL | Status: AC
  Administered 2022-05-12: 2 via ORAL
  Filled 2022-05-12: qty 2

## 2022-05-12 NOTE — MAU Provider Note (Signed)
History     [id number]  Arrival date and time: 05/12/22 4401    Chief Complaint  Patient presents with   Abdominal Pain   Back Pain   Vaginal Bleeding     HPI Natasha Byrd is a 36 y.o. at [redacted]w[redacted]d who presents for abdominal pain & back pain. Patient was diagnosed with missed ab yesterday but left without making a decision regarding plan because she was upset.  Reports increase in pain today. Reports abdominal cramping that radiates to her low back. Rates pain 10/10. Hasn't treated symptoms. Has had some bleeding; not saturating pads but has passed some clots.    OB History     Gravida  3   Para  2   Term  2   Preterm  0   AB  0   Living  2      SAB  0   IAB  0   Ectopic  0   Multiple      Live Births  2        Obstetric Comments  Hx Pre- eclampsia         Past Medical History:  Diagnosis Date   Attention deficit disorder with hyperactivity    Benzodiazepine abuse (HCC)    Chlamydia    as teen   Depression    doing good now   Headache    History of suicidal ideation    Homicidal ideations    History of,   UTI (urinary tract infection)     Past Surgical History:  Procedure Laterality Date   TONSILLECTOMY      Family History  Problem Relation Age of Onset   Abnormal EKG Mother    Hypertension Mother    Sleep apnea Father    Hypertension Brother    Hypertension Maternal Grandmother     No Known Allergies  No current facility-administered medications on file prior to encounter.   Current Outpatient Medications on File Prior to Encounter  Medication Sig Dispense Refill   folic acid (FOLVITE) 1 MG tablet Take 1 tablet (1 mg total) by mouth daily. 30 tablet 1   metroNIDAZOLE (METROGEL VAGINAL) 0.75 % vaginal gel Place 1 Applicatorful vaginally at bedtime. Insert one applicator, at bedtime, for 5 nights. 70 g 1   Prenatal Vit-Fe Fumarate-FA (PRENATAL MULTIVITAMIN) TABS tablet Take 1 tablet by mouth daily at 12 noon. Gummie Form is what  pt uses at home 30 tablet 1     ROS Pertinent positives and negative per HPI, all others reviewed and negative  Physical Exam   BP 117/73   Pulse 73   Temp 98.1 F (36.7 C) (Oral)   Ht 5\' 5"  (1.651 m)   Wt 73.9 kg   LMP 03/17/2022 (Approximate)   SpO2 100%   BMI 27.11 kg/m   Patient Vitals for the past 24 hrs:  BP Temp Temp src Pulse SpO2 Height Weight  05/12/22 1025 117/73 98.1 F (36.7 C) Oral 73 100 % -- --  05/12/22 1001 -- -- -- -- -- 5\' 5"  (1.651 m) 73.9 kg    Physical Exam Vitals and nursing note reviewed.  Constitutional:      General: She is in acute distress (patient visibly upset & crying).     Appearance: She is well-developed. She is not ill-appearing.  Pulmonary:     Effort: Pulmonary effort is normal. No respiratory distress.  Abdominal:     Palpations: Abdomen is soft.     Tenderness: There is no  abdominal tenderness.  Skin:    General: Skin is warm and dry.  Neurological:     Mental Status: She is alert.        Labs No results found for this or any previous visit (from the past 24 hour(s)).  Imaging  MAU Course  Procedures Lab Orders  No laboratory test(s) ordered today   Meds ordered this encounter  Medications   oxyCODONE-acetaminophen (PERCOCET/ROXICET) 5-325 MG per tablet 2 tablet   HYDROmorphone (DILAUDID) injection 1 mg   misoprostol (CYTOTEC) 200 MCG tablet    Sig: Take 4 tablets (800 mcg total) by mouth every other day as needed (take second dose in 48 hours if no results after first dose).    Dispense:  4 tablet    Refill:  1    Order Specific Question:   Supervising Provider    Answer:   Nashua Bing [[id number]]   oxyCODONE-acetaminophen (PERCOCET/ROXICET) 5-325 MG tablet    Sig: Take 1 tablet by mouth every 6 (six) hours as needed for up to 5 days for severe pain.    Dispense:  20 tablet    Refill:  0    Order Specific Question:   Supervising Provider    Answer:   Maize Bing [[id number]]   ibuprofen (ADVIL) 600  MG tablet    Sig: Take 1 tablet (600 mg total) by mouth every 6 (six) hours as needed for cramping.    Dispense:  30 tablet    Refill:  0    Order Specific Question:   Supervising Provider    Answer:   [place] Bing [[id number]]   US OB Transvaginal  Result Date: 05/12/2022 CLINICAL DATA:  Pelvic pain, vaginal bleeding EXAM: TRANSVAGINAL OB ULTRASOUND TECHNIQUE: Transvaginal ultrasound was performed for complete evaluation of the gestation as well as the maternal uterus, adnexal regions, and pelvic cul-de-sac. COMPARISON:  Previous studies including the examination of 05/11/2022 FINDINGS: Intrauterine gestational sac: Single Yolk sac:  Seen measuring 12 mm in diameter. Embryo:  Not seen Cardiac Activity: Not seen MSD: 20.5 mm   6 w   6 d Subchorionic hemorrhage:  None visualized. Maternal uterus/adnexae: Cervix is closed. There are no adnexal masses. There is no free fluid in pelvis. IMPRESSION: There is a gestational sac containing larger than usual sized yolk sac without fetal pole or fetal cardiac activity. Findings suggest possible failed gestation with incomplete abortion. Please correlate with serial HCG estimations and follow-up sonogram as clinically warranted. Electronically Signed   By: Ernie Avena M.D.   On: 05/12/2022 11:11     MDM Patient had ultrasound on 9/8 that showed IUGS with yolk sac. Ultrasound today shows IUGS MSD 20.5 mm & yolk sac, no embryo. Diagnostic for failed pregnancy.     She is RH positive   Discussed management of missed abortion: expectant management vs misoprostol vs D&E.  Risks and benefits of all modalities discussed; all questions answered.  Patient opted for misoprostol administration.  Risks and benefits of medical management were carefully explained, including a success rate of 80-90%, the need for another person to be at home with her, and to call/come in if she had heavy bleeding, dizziness, or severe pain not relieved by medication.  Verbal  consent was obtained.  Misoprostol 800 mcg with 1 refill & oxycodone were prescribed; written instructions given to patient. She will follow up in one week; if there has been no passage of pregnancy, will consider D&E.  Bleeding precautions reviewed; she was told to call clinic  or come to MAU for any concerns.  Assessment and Plan   1. Miscarriage   2. [redacted] weeks gestation of pregnancy    -Rx percocet & ibuprofen -Reviewed bleeding/infection precautions & reasons to return to MAU -Message to [facility] for 1 week follow up  Jorje Guild, NP 05/12/22 12:50 PM

## 2022-05-12 NOTE — MAU Note (Signed)
Natasha Byrd is a 36 y.o. at [redacted]w[redacted]d here in MAU reporting: "my baby's dead". States she's having lower abdominal and back pain.  States was seen yesterday @ office and informed she had a blighted ovum.  States wants her uterus "cleaned out".  "I want this over with". LMP: N/A Onset of complaint: yesterday Pain score: 10 There were no vitals filed for this visit.   FHT:N/A Lab orders placed from triage:   None  Pt asked to describe pain.  Pt states the "I told you the pain is a 10, it's off the charts.  It hurts, it really hurts.  Why the fuck do you keep asking me these dumb ass questions. I'm not answering anymore questions.  Can you go get somebody else."  Pt walked out of triage to nurse's station stating in a raised voice "she keeps asking me questions, asking me to rate my pain.  I told her it was a 10." Pt escorted into room 128 by Robyne Askew, NP for further evaluation.

## 2022-05-12 NOTE — Discharge Instructions (Signed)
Cytotec for Pregnancy Failure FACTS YOU SHOULD KNOW  WHAT IS AN EARLY PREGNANCY FAILURE? Once the egg is fertilized with the sperm and begins to develop, it attaches to the lining of the uterus. This early pregnancy tissue may not develop into an embryo (the beginning stage of a baby). Sometimes an embryo does develop but does not continue to grow. These problems can be seen on ultrasound.   MANAGEMNT OF EARLY PREGNANCY FAILURE: About 4 out of 100 (0.25%) women will have a pregnancy loss in her lifetime.  One in five pregnancies is found to be an early pregnancy failure.  There are 3 ways to care for an early pregnancy failure:   Surgery, (2) Medicine, (3) Waiting for you to pass the pregnancy on your own. The decision as to how to proceed after being diagnosed with and early pregnancy failure is an individual one.  The decision can be made only after appropriate counseling.  You need to weigh the pros and cons of the 3 choices. Then you can make the choice that works for you. SURGERY (D&E) Procedure over in 1 day Requires being put to sleep Bleeding may be light Possible problems during surgery, including injury to womb(uterus) Care provider has more control Medicine (CYTOTEC) The complete procedure may take days to weeks No Surgery Bleeding may be heavy at times There may be drug side effects Patient has more control Waiting You may choose to wait, in which case your own body may complete the passing of the abnormal early pregnancy on its own in about 2-4 weeks Your bleeding may be heavy at times There is a small possibility that you may need surgery if the bleeding is too much or not all of the pregnancy has passed. CYTOTEC MANAGEMENT Prostaglandins (cytotec) are the most widely used drug for this purpose. They cause the uterus to cramp and contract. You will place the medicine yourself inside your vagina in the privacy of your home. Empting of the uterus should occur within 3 days but  the process may continue for several weeks. The bleeding may seem heavy at times. POSSIBLE SIDE EFFECTS FROM CYTOTEC Nausea   Vomiting Diarrhea Fever Chills  Hot Flashes Side effects  from the process of the early pregnancy failure include: Cramping  Bleeding Headaches  Dizziness RISKS: This is a low risk procedure. Less than 1 in 100 women has a complication. An incomplete passage of the early pregnancy may occur. Also, Hemorrhage (heavy bleeding) could happen.  Rarely the pregnancy will not be passed completely. Excessively heavy bleeding may occur.  Your doctor may need to perform surgery to empty the uterus (D&E). Afterwards: Everybody will feel differently after the early pregnancy completion. You may have soreness or cramps for a day or two. You may have soreness or cramps for day or two.  You may have light bleeding for up to 2 weeks. You may be as active as you feel like being. If you have any of the following problems you may call Maternity Admissions Unit at [phone]. If you have pain that does not get better  with pain medication Bleeding that soaks through 2 thick full-sized sanitary pads in an hour Cramps that last longer than 2 days Foul smelling discharge Fever above 100.4 degrees F Even if you do not have any of these symptoms, you should have a follow-up exam to make sure you are healing properly. This appointment will be made for you before you leave the hospital. Your next normal period will  start again in 4-6 week after the loss. You can get pregnant soon after the loss, so use birth control right away. Finally: Make sure all your questions are answered before during and after any procedure. Follow up with medical care and family planning methods.     

## 2022-05-16 ENCOUNTER — Telehealth: Payer: Self-pay | Admitting: *Deleted

## 2022-05-16 ENCOUNTER — Encounter: Payer: Medicaid Other | Admitting: Obstetrics & Gynecology

## 2022-05-16 ENCOUNTER — Telehealth: Payer: Self-pay

## 2022-05-16 NOTE — Telephone Encounter (Signed)
TC from pt reporting missed AB, DX in MAU and now wants to have D & C procedure ASAP. Does not want to take RX cytotec. Consulted with Dr. Ernestina Patches. Pt given options:1) present to MAU and request procedure (pt advised would not be done in OR with anesthesia but with cervical block and pain meds) 2) present for scheduled visit with provider 05/17/22 @ 1:30 pm and provider could discuss and schedule D & C for later date 3) take cytotec as prescribed. Pt is understandably emotional and has multiple social and psych problems. In addition she is concerned about loosing her newly started job. In addition she has limited transportation. Pt concerns were actively listened to. Emotional support provided. Pt would like to keep appt 05/17/22 but requests telehealth due to transportation issue. Dr. Ernestina Patches was again consulted. Appt changed to telehealth visit. Message sent to Dr. Ilda Basset, provider pt is scheduled with 05/17/22. Returned call to pt to confirm. No answer. Left VM. MyChart message sent.

## 2022-05-16 NOTE — Telephone Encounter (Signed)
Called patient, no answer, left voicemail with surgery date, time, location, preop instructions and call back number. 

## 2022-05-17 ENCOUNTER — Ambulatory Visit (INDEPENDENT_AMBULATORY_CARE_PROVIDER_SITE_OTHER): Payer: Medicaid Other | Admitting: Obstetrics and Gynecology

## 2022-05-17 ENCOUNTER — Telehealth: Payer: Self-pay

## 2022-05-17 ENCOUNTER — Other Ambulatory Visit: Payer: Self-pay

## 2022-05-17 ENCOUNTER — Telehealth: Payer: Self-pay | Admitting: *Deleted

## 2022-05-17 ENCOUNTER — Encounter (HOSPITAL_COMMUNITY): Payer: Self-pay | Admitting: Obstetrics and Gynecology

## 2022-05-17 DIAGNOSIS — O021 Missed abortion: Secondary | ICD-10-CM

## 2022-05-17 MED ORDER — DOXYCYCLINE HYCLATE 100 MG IV SOLR
200.0000 mg | INTRAVENOUS | Status: AC
  Administered 2022-05-18: 200 mg via INTRAVENOUS
  Filled 2022-05-17 (×2): qty 200

## 2022-05-17 NOTE — Progress Notes (Signed)
TELEHEALTH GYNECOLOGY VISIT ENCOUNTER NOTE  Provider location: [facility]   Patient location: Home  I connected with Natasha Byrd on 05/17/22 at  1:30 PM EDT by telephone and verified that I am speaking with the correct person using two identifiers. Patient was unable to do MyChart audiovisual encounter due to technical difficulties, she tried several times.    I discussed the limitations, risks, security and privacy concerns of performing an evaluation and management service by telephone and the availability of in person appointments. I also discussed with the patient that there may be a patient responsible charge related to this service. The patient expressed understanding and agreed to proceed.   History:  Natasha Byrd is a 36 y.o. G56P2002 female being evaluated today for missed AB at approximately 8 weeks.  Patient seen in MAU twice and most recently elected for medical management on 9/29 but then she decided on surgical; she has been set up for d&c tomorrow, October 5th at [facility].  No GYN complaints or concerns   Past Medical History:  Diagnosis Date   Attention deficit disorder with hyperactivity    Benzodiazepine abuse ([place])    Chlamydia    as teen   Depression    doing good now   Headache    History of suicidal ideation    Homicidal ideations    History of,   UTI (urinary tract infection)    Past Surgical History:  Procedure Laterality Date   TONSILLECTOMY     The following portions of the patient's history were reviewed and updated as appropriate: allergies, current medications, past family history, past medical history, past social history, past surgical history and problem list.   Review of Systems:  Pertinent items noted in HPI and remainder of comprehensive ROS otherwise negative.  Physical Exam:   General:  Alert, oriented and cooperative.   Mental Status: Normal mood and affect perceived. Normal judgment and  thought content.  Physical exam deferred due to nature of the encounter  Labs and Imaging Results for orders placed or performed during the hospital encounter of 05/05/22 (from the past 336 hour(s))  Urinalysis, Routine w reflex microscopic Urine, Clean Catch   Collection Time: 05/05/22  8:27 AM  Result Value Ref Range   Color, Urine YELLOW YELLOW   APPearance HAZY (A) CLEAR   Specific Gravity, Urine 1.025 1.005 - 1.030   pH 7.0 5.0 - 8.0   Glucose, UA NEGATIVE NEGATIVE mg/dL   Hgb urine dipstick NEGATIVE NEGATIVE   Bilirubin Urine NEGATIVE NEGATIVE   Ketones, ur NEGATIVE NEGATIVE mg/dL   Protein, ur 30 (A) NEGATIVE mg/dL   Nitrite NEGATIVE NEGATIVE   Leukocytes,Ua NEGATIVE NEGATIVE   RBC / HPF 0-5 0 - 5 RBC/hpf   WBC, UA 0-5 0 - 5 WBC/hpf   Bacteria, UA RARE (A) NONE SEEN   Squamous Epithelial / LPF 11-20 0 - 5   Mucus PRESENT    US OB Transvaginal  Result Date: 05/12/2022 CLINICAL DATA:  Pelvic pain, vaginal bleeding EXAM: TRANSVAGINAL OB ULTRASOUND TECHNIQUE: Transvaginal ultrasound was performed for complete evaluation of the gestation as well as the maternal uterus, adnexal regions, and pelvic cul-de-sac. COMPARISON:  Previous studies including the examination of 05/11/2022 FINDINGS: Intrauterine gestational sac: Single Yolk sac:  Seen measuring 12 mm in diameter. Embryo:  Not seen Cardiac Activity: Not seen MSD: 20.5 mm   6 w   6 d Subchorionic hemorrhage:  None visualized. Maternal uterus/adnexae:  Cervix is closed. There are no adnexal masses. There is no free fluid in pelvis. IMPRESSION: There is a gestational sac containing larger than usual sized yolk sac without fetal pole or fetal cardiac activity. Findings suggest possible failed gestation with incomplete abortion. Please correlate with serial HCG estimations and follow-up sonogram as clinically warranted. Electronically Signed   By: Ernie Avena M.D.   On: 05/12/2022 11:11   US OB LESS THAN 14 WEEKS WITH OB  TRANSVAGINAL  Result Date: 05/11/2022 CLINICAL DATA:  Vaginal bleeding. EXAM: OBSTETRIC <14 WK [place] AND TRANSVAGINAL OB US TECHNIQUE: Both transabdominal and transvaginal ultrasound examinations were performed for complete evaluation of the gestation as well as the maternal uterus, adnexal regions, and pelvic cul-de-sac. Transvaginal technique was performed to assess early pregnancy. COMPARISON:  05/05/2022 FINDINGS: Intrauterine gestational sac: Present Yolk sac: Prominent yolk sac measuring 12 mm. Similar appearance on prior ultrasound. Embryo:  None Cardiac Activity: N/A Heart Rate: N/A bpm MSD: 19 mm   6 w   6 d Subchorionic hemorrhage:  None visualized. Maternal uterus/adnexae: Normal ovaries. IMPRESSION: Persistent empty gestational sac. Estimated mean sac diameter gestational age is 6 weeks and 6 days but no embryonic pole is identified. There is a persistent large yolk sac. Findings are most consistent with an anembryonic pregnancy (blighted ovum). Electronically Signed   By: Rudie Meyer M.D.   On: 05/11/2022 10:51   US OB Transvaginal  Result Date: 05/05/2022 CLINICAL DATA:  Cramping/bleeding early EXAM: TRANSVAGINAL OB ULTRASOUND TECHNIQUE: Transvaginal ultrasound was performed for complete evaluation of the gestation as well as the maternal uterus, adnexal regions, and pelvic cul-de-sac. COMPARISON:  Ultrasound 04/21/2022. FINDINGS: Intrauterine gestational sac: Single Yolk sac:  Visualized, borderline enlarged measuring 6-8 mm. Embryo:  Not Visualized. Cardiac Activity: Not Visualized. Heart Rate: Not applicable MSD: 11.4 mm   5 w   6 d, MSD previously measured 7.7 mm. Subchorionic hemorrhage:  Small Maternal uterus/adnexae: Normal ovaries. IMPRESSION: Mildly increased size of the intrauterine gestational sac with mean sac diameter 11.4 mm, previously 7.7 mm. Borderline enlarged yolk sac. No embryo with cardiac activity identified. Small subchorionic hemorrhage. Findings are suspicious but not yet  definitive for failed pregnancy. Recommend follow-up US in 10-14 days for definitive diagnosis. This recommendation follows SRU consensus guidelines: Diagnostic Criteria for Nonviable Pregnancy Early in the First Trimester. Malva Limes Med 2013; 829:9371-69. Electronically Signed   By: Caprice Renshaw M.D.   On: 05/05/2022 09:53   US OB Transvaginal  Result Date: 04/21/2022 CLINICAL DATA:  Vaginal bleeding; beta HCG in process at the time of dictation EXAM: TRANSVAGINAL OB ULTRASOUND TECHNIQUE: Transvaginal ultrasound was performed for complete evaluation of the gestation as well as the maternal uterus, adnexal regions, and pelvic cul-de-sac. COMPARISON:  Ultrasound 04/10/2022. FINDINGS: Intrauterine gestational sac: Single Yolk sac:  Visualized. Embryo:  Not Visualized. Cardiac Activity: Not Visualized. Heart Rate: Not visualized MSD: 7.7 mm   5 w   4 d Subchorionic hemorrhage:  None visualized. Maternal uterus/adnexae: Single gestational sac with yolk sac within the uterus. Unremarkable appearance of the bilateral adnexa with color Doppler flow in both ovaries. IMPRESSION: Intrauterine gestational sac with yolk sac. Mean sac diameter of 7.7 mm consistent with 5 week 4 day gestational age. No unexpected finding. Recommend close clinical follow-up with repeat obstetric scan as warranted by beta HCG and clinical assessment. Electronically Signed   By: Minerva Fester M.D.   On: 04/21/2022 21:17      Assessment and Plan:     Patient  has issues with her phone and trouble getting in touch with her so I reviewed with her and I talked to the OR who said for her to get to the main hospital at 11am tomorrow for her case at 1330. I also reviewed with the patient re: the procedure, recovery process, need for support person after surgery, etc.   All questions asked and answered.       I discussed the assessment and treatment plan with the patient. The patient was provided an opportunity to ask questions and all were  answered. The patient agreed with the plan and demonstrated an understanding of the instructions.   The patient was advised to call back or seek an in-person evaluation/go to the ED if the symptoms worsen or if the condition fails to improve as anticipated.  I provided 15 minutes of non-face-to-face time during this encounter.   Hendricks Bing, MD [facility] for [employer], [facility]

## 2022-05-17 NOTE — Progress Notes (Signed)
PCP - none Cardiologist - n/a  Chest x-ray - 110/7/22 EKG - 05/20/21 Stress Test - n/a ECHO - n/a Cardiac Cath - n/a  ICD Pacemaker/Loop - n/a  Sleep Study -  n/a CPAP - none  ERAS: Clear liquids til 10:30 AM DOS.  STOP now taking any Aspirin (unless otherwise instructed by your surgeon), Aleve, Naproxen, Ibuprofen, Motrin, Advil, Goody's, BC's, all herbal medications, fish oil, and all vitamins.   Coronavirus Screening Do you have any of the following symptoms:  Cough yes/no: No Fever (>100.40F)  yes/no: No Runny nose yes/no: No Sore throat yes/no: No Difficulty breathing/shortness of breath  yes/no: No  Have you traveled in the last 14 days and where? yes/no: No  Patient verbalized understanding of instructions that were given via phone.

## 2022-05-17 NOTE — Telephone Encounter (Signed)
TC from pt regarding scheduling of D & C. Advised surgery scheduler would reach out to her. Msg sent to scheduler to f/u for pt. Scheduler called pt 10/3 and left VM. Pt sts she can not access VM right now. Msg sent to scheduler to repeat call to pt. Pt advised to answer all calls.

## 2022-05-17 NOTE — Telephone Encounter (Signed)
Called patient about surgery date, time, location and preop instructions, no answer, did not leave a voicemail as she told staff earlier that she is unable to check her voicemails.

## 2022-05-17 NOTE — Telephone Encounter (Signed)
TC from pt. States she has not received call back regarding surgery time of arrival, location, etc. Pt was previously called and VM left by scheduler. Pt states she can not access her VM. Msg sent to scheduler.

## 2022-05-18 ENCOUNTER — Other Ambulatory Visit: Payer: Medicaid Other

## 2022-05-18 ENCOUNTER — Encounter (HOSPITAL_COMMUNITY)
Admission: RE | Disposition: A | Discharge status: Home / Self Care | Payer: Self-pay | Source: Home / Self Care | Attending: Obstetrics and Gynecology

## 2022-05-18 ENCOUNTER — Ambulatory Visit (HOSPITAL_COMMUNITY)
Admission: RE | Admit: 2022-05-18 | Discharge: 2022-05-18 | Disposition: A | Discharge status: Home / Self Care | Payer: Medicaid Other | Attending: Obstetrics and Gynecology | Admitting: Obstetrics and Gynecology

## 2022-05-18 ENCOUNTER — Ambulatory Visit (HOSPITAL_COMMUNITY): Payer: Medicaid Other | Admitting: Anesthesiology

## 2022-05-18 ENCOUNTER — Other Ambulatory Visit: Payer: Self-pay

## 2022-05-18 ENCOUNTER — Encounter (HOSPITAL_COMMUNITY): Payer: Self-pay | Admitting: Obstetrics and Gynecology

## 2022-05-18 ENCOUNTER — Ambulatory Visit (HOSPITAL_BASED_OUTPATIENT_CLINIC_OR_DEPARTMENT_OTHER): Payer: Medicaid Other | Admitting: Anesthesiology

## 2022-05-18 DIAGNOSIS — Z87891 Personal history of nicotine dependence: Secondary | ICD-10-CM | POA: Diagnosis not present

## 2022-05-18 DIAGNOSIS — O021 Missed abortion: Secondary | ICD-10-CM | POA: Diagnosis not present

## 2022-05-18 DIAGNOSIS — Z3A01 Less than 8 weeks gestation of pregnancy: Secondary | ICD-10-CM | POA: Diagnosis not present

## 2022-05-18 HISTORY — PX: DILATION AND EVACUATION: SHX1459

## 2022-05-18 LAB — COMPREHENSIVE METABOLIC PANEL
ALT: 10 U/L (ref 0–44)
AST: 18 U/L (ref 15–41)
Albumin: 3.9 g/dL (ref 3.5–5.0)
Alkaline Phosphatase: 34 U/L — ABNORMAL LOW (ref 38–126)
Anion gap: 5 (ref 5–15)
BUN: 12 mg/dL (ref 6–20)
CO2: 24 mmol/L (ref 22–32)
Calcium: 9.2 mg/dL (ref 8.9–10.3)
Chloride: 109 mmol/L (ref 98–111)
Creatinine, Ser: 0.66 mg/dL (ref 0.44–1.00)
GFR, Estimated: >60 mL/min (ref >60)
Glucose, Bld: 82 mg/dL (ref 70–99)
Potassium: 3.3 mmol/L — ABNORMAL LOW (ref 3.5–5.1)
Sodium: 138 mmol/L (ref 135–145)
Total Bilirubin: 0.4 mg/dL (ref 0.3–1.2)
Total Protein: 7.3 g/dL (ref 6.5–8.1)

## 2022-05-18 LAB — CBC
HCT: 36.7 % (ref 36.0–46.0)
Hemoglobin: 12.7 g/dL (ref 12.0–15.0)
MCH: 33 pg (ref 26.0–34.0)
MCHC: 34.6 g/dL (ref 30.0–36.0)
MCV: 95.3 fL (ref 80.0–100.0)
Platelets: 215 10*3/uL (ref 150–400)
RBC: 3.85 MIL/uL — ABNORMAL LOW (ref 3.87–5.11)
RDW: 11.6 % (ref 11.5–15.5)
WBC: 2.9 10*3/uL — ABNORMAL LOW (ref 4.0–10.5)
nRBC: 0 % (ref 0.0–0.2)

## 2022-05-18 LAB — TYPE AND SCREEN
ABO/RH(D): A POS
Antibody Screen: NEGATIVE

## 2022-05-18 SURGERY — DILATION AND EVACUATION, UTERUS
Anesthesia: General | Site: Vagina

## 2022-05-18 MED ORDER — AMISULPRIDE (ANTIEMETIC) 5 MG/2ML IV SOLN: 10.0000 mg | Freq: Once | INTRAVENOUS | Status: DC | PRN

## 2022-05-18 MED ORDER — ACETAMINOPHEN 500 MG PO TABS
1000.0000 mg | ORAL_TABLET | ORAL | Status: AC
  Administered 2022-05-18: 1000 mg via ORAL
  Filled 2022-05-18: qty 2

## 2022-05-18 MED ORDER — PROPOFOL 10 MG/ML IV BOLUS
INTRAVENOUS | Status: AC
  Filled 2022-05-18: qty 20

## 2022-05-18 MED ORDER — FENTANYL CITRATE (PF) 100 MCG/2ML IJ SOLN
25.0000 ug | INTRAMUSCULAR | Status: DC | PRN
  Administered 2022-05-18 (×2): 50 ug via INTRAVENOUS

## 2022-05-18 MED ORDER — PROPOFOL 10 MG/ML IV BOLUS
INTRAVENOUS | Status: DC | PRN
  Administered 2022-05-18: 160 mg via INTRAVENOUS

## 2022-05-18 MED ORDER — FENTANYL CITRATE (PF) 100 MCG/2ML IJ SOLN
INTRAMUSCULAR | Status: AC
  Filled 2022-05-18: qty 2

## 2022-05-18 MED ORDER — FENTANYL CITRATE (PF) 100 MCG/2ML IJ SOLN
50.0000 ug | INTRAMUSCULAR | Status: AC | PRN
  Administered 2022-05-18: 100 ug via INTRAVENOUS
  Administered 2022-05-18: 50 ug via INTRAVENOUS
  Filled 2022-05-18: qty 2

## 2022-05-18 MED ORDER — CHLORHEXIDINE GLUCONATE 0.12 % MT SOLN
OROMUCOSAL | Status: AC
  Administered 2022-05-18: 15 mL via OROMUCOSAL
  Filled 2022-05-18: qty 15

## 2022-05-18 MED ORDER — 0.9 % SODIUM CHLORIDE (POUR BTL) OPTIME
TOPICAL | Status: DC | PRN
  Administered 2022-05-18: 1000 mL

## 2022-05-18 MED ORDER — EPHEDRINE SULFATE-NACL 50-0.9 MG/10ML-% IV SOSY
PREFILLED_SYRINGE | INTRAVENOUS | Status: DC | PRN
  Administered 2022-05-18: 15 mg via INTRAVENOUS
  Administered 2022-05-18: 10 mg via INTRAVENOUS

## 2022-05-18 MED ORDER — ORAL CARE MOUTH RINSE: 15.0000 mL | Freq: Once | OROMUCOSAL | Status: AC

## 2022-05-18 MED ORDER — ONDANSETRON HCL 4 MG/2ML IJ SOLN
INTRAMUSCULAR | Status: DC | PRN
  Administered 2022-05-18: 4 mg via INTRAVENOUS

## 2022-05-18 MED ORDER — OXYCODONE HCL 5 MG PO TABS
5.0000 mg | ORAL_TABLET | Freq: Once | ORAL | Status: AC
  Administered 2022-05-18: 5 mg via ORAL

## 2022-05-18 MED ORDER — PROMETHAZINE HCL 25 MG/ML IJ SOLN: 6.2500 mg | INTRAMUSCULAR | Status: DC | PRN

## 2022-05-18 MED ORDER — SOD CITRATE-CITRIC ACID 500-334 MG/5ML PO SOLN
30.0000 mL | ORAL | Status: AC
  Administered 2022-05-18: 30 mL via ORAL
  Filled 2022-05-18: qty 30

## 2022-05-18 MED ORDER — OXYCODONE HCL 5 MG PO TABS
ORAL_TABLET | ORAL | Status: AC
  Filled 2022-05-18: qty 1

## 2022-05-18 MED ORDER — LACTATED RINGERS IV SOLN: INTRAVENOUS | Status: DC

## 2022-05-18 MED ORDER — OXYCODONE HCL 5 MG PO TABS: 5.0000 mg | ORAL_TABLET | ORAL | 0 refills | Status: DC | PRN

## 2022-05-18 MED ORDER — CHLORHEXIDINE GLUCONATE 0.12 % MT SOLN: 15.0000 mL | Freq: Once | OROMUCOSAL | Status: AC

## 2022-05-18 MED ORDER — FENTANYL CITRATE (PF) 250 MCG/5ML IJ SOLN
INTRAMUSCULAR | Status: AC
  Filled 2022-05-18: qty 5

## 2022-05-18 MED ORDER — LIDOCAINE 2% (20 MG/ML) 5 ML SYRINGE
INTRAMUSCULAR | Status: DC | PRN
  Administered 2022-05-18: 100 mg via INTRAVENOUS

## 2022-05-18 MED ORDER — FENTANYL CITRATE (PF) 250 MCG/5ML IJ SOLN
INTRAMUSCULAR | Status: DC | PRN
  Administered 2022-05-18: 50 ug via INTRAVENOUS

## 2022-05-18 MED ORDER — MIDAZOLAM HCL 2 MG/2ML IJ SOLN
INTRAMUSCULAR | Status: AC
  Filled 2022-05-18: qty 2

## 2022-05-18 MED ORDER — METHYLERGONOVINE MALEATE 0.2 MG/ML IJ SOLN
INTRAMUSCULAR | Status: AC
  Filled 2022-05-18: qty 1

## 2022-05-18 MED ORDER — IBUPROFEN 600 MG PO TABS: 600.0000 mg | ORAL_TABLET | Freq: Four times a day (QID) | ORAL | 1 refills | Status: DC | PRN

## 2022-05-18 MED ORDER — DEXAMETHASONE SODIUM PHOSPHATE 10 MG/ML IJ SOLN
INTRAMUSCULAR | Status: DC | PRN
  Administered 2022-05-18: 10 mg via INTRAVENOUS

## 2022-05-18 MED ORDER — POVIDONE-IODINE 10 % EX SWAB
2.0000 | Freq: Once | CUTANEOUS | Status: AC
  Administered 2022-05-18: 2 via TOPICAL

## 2022-05-18 MED ORDER — MIDAZOLAM HCL 2 MG/2ML IJ SOLN
INTRAMUSCULAR | Status: DC | PRN
  Administered 2022-05-18: 2 mg via INTRAVENOUS

## 2022-05-18 SURGICAL SUPPLY — 22 items
CATH ROBINSON RED A/P 16FR (CATHETERS) ×1 IMPLANT
FILTER UTR ASPR ASSEMBLY (MISCELLANEOUS) ×1 IMPLANT
GLOVE SURG ORTHO 8.0 STRL STRW (GLOVE) ×1 IMPLANT
GOWN STRL REUS W/ TWL LRG LVL3 (GOWN DISPOSABLE) ×1 IMPLANT
GOWN STRL REUS W/ TWL XL LVL3 (GOWN DISPOSABLE) ×1 IMPLANT
GOWN STRL REUS W/TWL LRG LVL3 (GOWN DISPOSABLE) ×1
GOWN STRL REUS W/TWL XL LVL3 (GOWN DISPOSABLE) ×1
HOSE CONNECTING 18IN BERKELEY (TUBING) ×1 IMPLANT
KIT BERKELEY 1ST TRI 3/8 NO TR (MISCELLANEOUS) ×1 IMPLANT
KIT BERKELEY 1ST TRIMESTER 3/8 (MISCELLANEOUS) ×1 IMPLANT
NS IRRIG 1000ML POUR BTL (IV SOLUTION) ×1 IMPLANT
PACK VAGINAL MINOR WOMEN LF (CUSTOM PROCEDURE TRAY) ×1 IMPLANT
PAD OB MATERNITY 4.3X12.25 (PERSONAL CARE ITEMS) ×1 IMPLANT
SET BERKELEY SUCTION TUBING (SUCTIONS) ×1 IMPLANT
SPIKE FLUID TRANSFER (MISCELLANEOUS) ×1 IMPLANT
TOWEL GREEN STERILE FF (TOWEL DISPOSABLE) ×2 IMPLANT
UNDERPAD 30X36 HEAVY ABSORB (UNDERPADS AND DIAPERS) ×1 IMPLANT
VACURETTE 10 RIGID CVD (CANNULA) IMPLANT
VACURETTE 7MM CVD STRL WRAP (CANNULA) IMPLANT
VACURETTE 8 RIGID CVD (CANNULA) IMPLANT
VACURETTE 8MM F TIP (MISCELLANEOUS) IMPLANT
VACURETTE 9 RIGID CVD (CANNULA) IMPLANT

## 2022-05-18 NOTE — Transfer of Care (Signed)
Immediate Anesthesia Transfer of Care Note  Patient: Natasha Byrd  Procedure(s) Performed: DILATATION AND EVACUATION (Vagina )  Patient Location: PACU  Anesthesia Type:General  Level of Consciousness: drowsy and patient cooperative  Airway & Oxygen Therapy: Patient Spontanous Breathing  Post-op Assessment: Report given to RN and Post -op Vital signs reviewed and stable  Post vital signs: Reviewed and stable  Last Vitals:  Vitals Value Taken Time  BP 122/69 05/18/22 1409  Temp    Pulse 75 05/18/22 1411  Resp 14 05/18/22 1411  SpO2 100 % 05/18/22 1411  Vitals shown include unvalidated device data.  Last Pain:  Vitals:   05/18/22 1154  TempSrc:   PainSc: 10-Worst pain ever         Complications: No notable events documented.

## 2022-05-18 NOTE — Anesthesia Procedure Notes (Signed)
Procedure Name: LMA Insertion Date/Time: 05/18/2022 1:36 PM  Performed by: Lance Coon, CRNAPre-anesthesia Checklist: Patient identified, Emergency Drugs available, Suction available, Patient being monitored and Timeout performed Patient Re-evaluated:Patient Re-evaluated prior to induction Oxygen Delivery Method: Circle system utilized Preoxygenation: Pre-oxygenation with 100% oxygen Induction Type: IV induction LMA: LMA inserted LMA Size: 3.0 Number of attempts: 1 Placement Confirmation: positive ETCO2 and breath sounds checked- equal and bilateral Tube secured with: Tape Dental Injury: Teeth and Oropharynx as per pre-operative assessment

## 2022-05-18 NOTE — Anesthesia Postprocedure Evaluation (Signed)
Anesthesia Post Note  Patient: Natasha Byrd  Procedure(s) Performed: DILATATION AND EVACUATION (Vagina )     Patient location during evaluation: PACU Anesthesia Type: General Level of consciousness: sedated Pain management: pain level controlled Vital Signs Assessment: post-procedure vital signs reviewed and stable Respiratory status: spontaneous breathing and respiratory function stable Cardiovascular status: stable Postop Assessment: no apparent nausea or vomiting Anesthetic complications: no   No notable events documented.  Last Vitals:  Vitals:   05/18/22 1440 05/18/22 1455  BP: 115/70 114/74  Pulse: 99 65  Resp: 15 14  Temp:  36.7 C  SpO2: 99% 98%    Last Pain:  Vitals:   05/18/22 1452  TempSrc:   PainSc: 4                  Rohith Fauth DANIEL

## 2022-05-18 NOTE — Anesthesia Preprocedure Evaluation (Addendum)
Anesthesia Evaluation  Patient identified by MRN, date of birth, ID band Patient awake    Reviewed: Allergy & Precautions, NPO status , Patient's Chart, lab work & pertinent test results  History of Anesthesia Complications Negative for: history of anesthetic complications  Airway Mallampati: II  TM Distance: >3 FB Neck ROM: Full    Dental no notable dental hx. (+) Dental Advisory Given   Pulmonary neg pulmonary ROS, former smoker,    Pulmonary exam normal        Cardiovascular negative cardio ROS Normal cardiovascular exam     Neuro/Psych PSYCHIATRIC DISORDERS Depression negative neurological ROS     GI/Hepatic negative GI ROS, Neg liver ROS,   Endo/Other  negative endocrine ROS  Renal/GU negative Renal ROS     Musculoskeletal negative musculoskeletal ROS (+)   Abdominal   Peds  Hematology negative hematology ROS (+)   Anesthesia Other Findings   Reproductive/Obstetrics                            Anesthesia Physical Anesthesia Plan  ASA: 2  Anesthesia Plan: General   Post-op Pain Management: Tylenol PO (pre-op)* and Toradol IV (intra-op)*   Induction: Intravenous  PONV Risk Score and Plan: 3 and Ondansetron, Dexamethasone and Midazolam  Airway Management Planned: LMA  Additional Equipment:   Intra-op Plan:   Post-operative Plan: Extubation in OR  Informed Consent: I have reviewed the patients History and Physical, chart, labs and discussed the procedure including the risks, benefits and alternatives for the proposed anesthesia with the patient or authorized representative who has indicated his/her understanding and acceptance.     Dental advisory given  Plan Discussed with: Anesthesiologist and CRNA  Anesthesia Plan Comments:        Anesthesia Quick Evaluation

## 2022-05-18 NOTE — H&P (Signed)
OB/GYN Pre-Op History and Physical  Natasha Byrd is a 36 y.o. [id number] presenting for management of missed abortion.  Pt was diagnosed with missed abortion on 05/11/22.  Pt was given option of medical management versus surgical procedure.  Pt desires procedure.  Risks and benefits of the procedure were given including bleeding, infection, involvement of other organs including bladder and bowel as well as uterine perforation.      Past Medical History:  Diagnosis Date   Attention deficit disorder with hyperactivity    Benzodiazepine abuse (HCC)    and ETOH abuse   Chlamydia    as teen   Depression    doing good now   Headache    History of suicidal ideation    Homicidal ideations    History of,   UTI (urinary tract infection)     Past Surgical History:  Procedure Laterality Date   TONSILLECTOMY      OB History  Gravida Para Term Preterm AB Living  3 2 2  0 0 2  SAB IAB Ectopic Multiple Live Births  0 0 0   2    # Outcome Date GA Lbr Len/2nd Weight Sex Delivery Anes PTL Lv  3 Current           2 Term 03/27/13 [redacted]w[redacted]d 16:20 / 00:11 2970 g F Vag-Spont EPI  LIV  1 Term 08/08/08 [redacted]w[redacted]d 23:00 3544 g M Vag-Spont EPI  LIV     Birth Comments: Initially twin pregnancy.  Twin A demise noted at 26 weeks, but was noted to be a remote demise as decomposed tissue identified.  12 week scan showed viable Twin A.  So demise occured early in the 2nd trimester. No other complications during pregnancy.    Obstetric Comments  Hx Pre- eclampsia    Social History   Socioeconomic History   Marital status: Single    Spouse name: Not on file   Number of children: Not on file   Years of education: Not on file   Highest education level: Not on file  Occupational History   Not on file  Tobacco Use   Smoking status: Former    Packs/day: 0.30    Types: Cigarettes   Smokeless tobacco: Never   Tobacco comments:    Quit smoking cig Spring 2023  Vaping Use   Vaping Use: Never used  Substance  and Sexual Activity   Alcohol use: Not Currently    Alcohol/week: 4.0 standard drinks of alcohol    Types: 4 Cans of beer per week    Comment: 2 yrs ago   Drug use: Not Currently    Types: Marijuana    Comment: none since incarcerated (~2020)   Sexual activity: Not Currently    Birth control/protection: None  Other Topics Concern   Not on file  Social History Narrative   ** Merged History Encounter **       Social Determinants of Health   Financial Resource Strain: Not on file  Food Insecurity: Not on file  Transportation Needs: Not on file  Physical Activity: Not on file  Stress: Not on file  Social Connections: Not on file    Family History  Problem Relation Age of Onset   Abnormal EKG Mother    Hypertension Mother    Sleep apnea Father    Hypertension Brother    Hypertension Maternal Grandmother     Medications Prior to Admission  Medication Sig Dispense Refill Last Dose   folic acid (FOLVITE) 1 MG  tablet Take 1 tablet (1 mg total) by mouth daily. 30 tablet 1 Past Month   ibuprofen (ADVIL) 600 MG tablet Take 1 tablet (600 mg total) by mouth every 6 (six) hours as needed for cramping. 30 tablet 0 Past Week   metroNIDAZOLE (METROGEL VAGINAL) 0.75 % vaginal gel Place 1 Applicatorful vaginally at bedtime. Insert one applicator, at bedtime, for 5 nights. 70 g 1    Prenatal Vit-Fe Fumarate-FA (PRENATAL MULTIVITAMIN) TABS tablet Take 1 tablet by mouth daily at 12 noon. Gummie Form is what pt uses at home 30 tablet 1 Past Month   misoprostol (CYTOTEC) 200 MCG tablet Take 4 tablets (800 mcg total) by mouth every other day as needed (take second dose in 48 hours if no results after first dose). (Patient not taking: Reported on 05/17/2022) 4 tablet 1 Not Taking    No Known Allergies  Review of Systems: Negative except for what is mentioned in HPI.     Physical Exam: BP 119/70   Pulse 64   Temp 97.7 F (36.5 C) (Oral)   Resp 18   Ht 5\' 5"  (1.651 m)   Wt 69.9 kg   LMP  03/17/2022 (Approximate)   SpO2 100%   BMI 25.63 kg/m  CONSTITUTIONAL: Well-developed, well-nourished female in no acute distress.  HENT:  Normocephalic, atraumatic, External right and left ear normal. Oropharynx is clear and moist EYES: Conjunctivae and EOM are normal.  NECK: Normal range of motion, supple, no masses SKIN: Skin is warm and dry. No rash noted. Not diaphoretic. No erythema. No pallor. [place]: Alert and oriented to person, place, and time. Normal reflexes, muscle tone coordination. No cranial nerve deficit noted. PSYCHIATRIC: Normal mood and affect. Normal behavior. Normal judgment and thought content. CARDIOVASCULAR: Normal heart rate noted, regular rhythm RESPIRATORY: Effort and breath sounds normal, no problems with respiration noted ABDOMEN: Soft, nontender, nondistended, PELVIC: Deferred MUSCULOSKELETAL: Normal range of motion. No edema and no tenderness. 2+ distal pulses.   Pertinent Labs/Studies:   CLINICAL DATA:  Pelvic pain, vaginal bleeding   EXAM: TRANSVAGINAL OB ULTRASOUND   TECHNIQUE: Transvaginal ultrasound was performed for complete evaluation of the gestation as well as the maternal uterus, adnexal regions, and pelvic cul-de-sac.   COMPARISON:  Previous studies including the examination of 05/11/2022   FINDINGS: Intrauterine gestational sac: Single   Yolk sac:  Seen measuring 12 mm in diameter.   Embryo:  Not seen   Cardiac Activity: Not seen   MSD: 20.5 mm   6 w   6 d   Subchorionic hemorrhage:  None visualized.   Maternal uterus/adnexae: Cervix is closed. There are no adnexal masses. There is no free fluid in pelvis.   IMPRESSION: There is a gestational sac containing larger than usual sized yolk sac without fetal pole or fetal cardiac activity. Findings suggest possible failed gestation with incomplete abortion. Please correlate with serial HCG estimations and follow-up sonogram as clinically warranted.    Assessment and  Plan :Natasha Byrd is a 36 y.o. [id number] here for suction dilation and evacuation.   Plan for suction D and E, risks and benefits given NPO Admission labs ordered, currently pending VS Q4    Lynnda Shields, M.D. Attending [place], [facility] for [employer], [place]

## 2022-05-18 NOTE — Op Note (Signed)
Natasha Byrd PROCEDURE DATE: 05/18/2022  PREOPERATIVE DIAGNOSIS: 7 week missed abortion POSTOPERATIVE DIAGNOSIS: The same PROCEDURE:     Dilation and Evacuation SURGEON:  Lynnda Shields  INDICATIONS: 36 y.o. [id number] with MAB at 7-[redacted] weeks gestation, needing surgical completion.  Risks of surgery were discussed with the patient including but not limited to: bleeding which may require transfusion; infection which may require antibiotics; injury to uterus or surrounding organs; need for additional procedures including laparotomy or laparoscopy; possibility of intrauterine scarring which may impair future fertility; and other postoperative/anesthesia complications. Written informed consent was obtained.    FINDINGS:  A 7 week size uterus, moderate amounts of products of conception, specimen sent to pathology.  ANESTHESIA: General-LMA. INTRAVENOUS FLUIDS:  600 ml of LR ESTIMATED BLOOD LOSS:  Less than 50 ml. UOP:100 ml SPECIMENS:  Products of conception sent to pathology COMPLICATIONS:  None immediate.  PROCEDURE DETAILS:  The patient received intravenous Doxycycline while in the preoperative area.  She was then taken to the operating room where general anesthesia was administered and was found to be adequate.  After an adequate timeout was performed, she was placed in the dorsal lithotomy position and examined; then prepped and draped in the sterile manner.   Her bladder was catheterized for an unmeasured amount of clear, yellow urine. A vaginal speculum was then placed in the patient's vagina and a single tooth tenaculum was applied to the anterior lip of the cervix.  The uterus was sounded to 9 cm.  The cervix was gently dilated to accommodate a 8 mm suction curette that was gently advanced to the uterine fundus. The suction device was then activated and curette slowly rotated to clear the uterus of products of conception.  Suction curettage was done until complete emptying of the uterus was  confirmed.  A gentle sharp curettage was performed until a gritty texture was felt throughout. There was minimal bleeding noted and the tenaculum removed with good hemostasis noted.   All instruments were removed from the patient's vagina.  Sponge and instrument counts were correct times two  The patient tolerated the procedure well and was taken to the recovery area extubated, awake, and in stable condition.  The patient will be discharged to home as per PACU criteria.  Routine postoperative instructions given.  She was prescribed Oxycodone, Ibuprofen  She will follow up in the office in 2-3 weeks for postoperative evaluation  Lynnda Shields, MD, [place], [facility] for [place]

## 2022-05-19 ENCOUNTER — Encounter (HOSPITAL_COMMUNITY): Payer: Self-pay | Admitting: Obstetrics and Gynecology

## 2022-05-19 LAB — SURGICAL PATHOLOGY

## 2022-06-08 ENCOUNTER — Encounter: Payer: Medicaid Other | Admitting: Obstetrics and Gynecology

## 2022-06-08 ENCOUNTER — Institutional Professional Consult (permissible substitution): Payer: No Payment, Other | Admitting: Licensed Clinical Social Worker

## 2022-06-21 ENCOUNTER — Encounter: Payer: Self-pay | Admitting: Obstetrics and Gynecology

## 2022-06-25 DIAGNOSIS — J101 Influenza due to other identified influenza virus with other respiratory manifestations: Secondary | ICD-10-CM | POA: Diagnosis not present

## 2022-06-30 DIAGNOSIS — J101 Influenza due to other identified influenza virus with other respiratory manifestations: Secondary | ICD-10-CM | POA: Diagnosis not present

## 2022-08-14 ENCOUNTER — Encounter (HOSPITAL_COMMUNITY): Payer: Self-pay | Admitting: Pharmacy Technician

## 2022-08-14 ENCOUNTER — Emergency Department (HOSPITAL_COMMUNITY)
Admission: EM | Admit: 2022-08-14 | Discharge: 2022-08-14 | Disposition: A | Discharge status: Home / Self Care | Payer: Medicaid Other | Attending: Emergency Medicine | Admitting: Emergency Medicine

## 2022-08-14 DIAGNOSIS — E876 Hypokalemia: Secondary | ICD-10-CM | POA: Insufficient documentation

## 2022-08-14 DIAGNOSIS — R1084 Generalized abdominal pain: Secondary | ICD-10-CM | POA: Diagnosis not present

## 2022-08-14 DIAGNOSIS — Z1152 Encounter for screening for COVID-19: Secondary | ICD-10-CM | POA: Diagnosis not present

## 2022-08-14 DIAGNOSIS — D72819 Decreased white blood cell count, unspecified: Secondary | ICD-10-CM | POA: Insufficient documentation

## 2022-08-14 DIAGNOSIS — R112 Nausea with vomiting, unspecified: Secondary | ICD-10-CM | POA: Diagnosis present

## 2022-08-14 LAB — LIPASE, BLOOD: Lipase: 29 U/L (ref 11–51)

## 2022-08-14 LAB — URINALYSIS, ROUTINE W REFLEX MICROSCOPIC
Bacteria, UA: NONE SEEN
Bilirubin Urine: NEGATIVE
Glucose, UA: NEGATIVE mg/dL
Hgb urine dipstick: NEGATIVE
Ketones, ur: NEGATIVE mg/dL
Leukocytes,Ua: NEGATIVE
Nitrite: NEGATIVE
Protein, ur: 30 mg/dL — AB
Specific Gravity, Urine: 1.029 (ref 1.005–1.030)
pH: 6 (ref 5.0–8.0)

## 2022-08-14 LAB — RESP PANEL BY RT-PCR (RSV, FLU A&B, COVID)  RVPGX2
Influenza A by PCR: NEGATIVE
Influenza B by PCR: NEGATIVE
Resp Syncytial Virus by PCR: NEGATIVE
SARS Coronavirus 2 by RT PCR: NEGATIVE

## 2022-08-14 LAB — COMPREHENSIVE METABOLIC PANEL WITH GFR
ALT: 10 U/L (ref 0–44)
AST: 18 U/L (ref 15–41)
Albumin: 3.8 g/dL (ref 3.5–5.0)
Alkaline Phosphatase: 45 U/L (ref 38–126)
Anion gap: 8 (ref 5–15)
BUN: 13 mg/dL (ref 6–20)
CO2: 22 mmol/L (ref 22–32)
Calcium: 8.8 mg/dL — ABNORMAL LOW (ref 8.9–10.3)
Chloride: 105 mmol/L (ref 98–111)
Creatinine, Ser: 1 mg/dL (ref 0.44–1.00)
GFR, Estimated: >60 mL/min
Glucose, Bld: 117 mg/dL — ABNORMAL HIGH (ref 70–99)
Potassium: 3.2 mmol/L — ABNORMAL LOW (ref 3.5–5.1)
Sodium: 135 mmol/L (ref 135–145)
Total Bilirubin: 0.5 mg/dL (ref 0.3–1.2)
Total Protein: 6.8 g/dL (ref 6.5–8.1)

## 2022-08-14 LAB — CBC WITH DIFFERENTIAL/PLATELET
Abs Immature Granulocytes: 0.01 10*3/uL (ref 0.00–0.07)
Basophils Absolute: 0 10*3/uL (ref 0.0–0.1)
Basophils Relative: 0 %
Eosinophils Absolute: 0 10*3/uL (ref 0.0–0.5)
Eosinophils Relative: 0 %
HCT: 35.8 % — ABNORMAL LOW (ref 36.0–46.0)
Hemoglobin: 11.9 g/dL — ABNORMAL LOW (ref 12.0–15.0)
Immature Granulocytes: 0 %
Lymphocytes Relative: 12 %
Lymphs Abs: 0.5 10*3/uL — ABNORMAL LOW (ref 0.7–4.0)
MCH: 32.4 pg (ref 26.0–34.0)
MCHC: 33.2 g/dL (ref 30.0–36.0)
MCV: 97.5 fL (ref 80.0–100.0)
Monocytes Absolute: 0.5 10*3/uL (ref 0.1–1.0)
Monocytes Relative: 14 %
Neutro Abs: 2.8 10*3/uL (ref 1.7–7.7)
Neutrophils Relative %: 74 %
Platelets: 164 10*3/uL (ref 150–400)
RBC: 3.67 MIL/uL — ABNORMAL LOW (ref 3.87–5.11)
RDW: 11.9 % (ref 11.5–15.5)
WBC: 3.8 10*3/uL — ABNORMAL LOW (ref 4.0–10.5)
nRBC: 0 % (ref 0.0–0.2)

## 2022-08-14 LAB — RAPID URINE DRUG SCREEN, HOSP PERFORMED
Amphetamines: NOT DETECTED
Barbiturates: NOT DETECTED
Benzodiazepines: NOT DETECTED
Cocaine: NOT DETECTED
Opiates: NOT DETECTED
Tetrahydrocannabinol: POSITIVE — AB

## 2022-08-14 LAB — HCG, QUANTITATIVE, PREGNANCY: hCG, Beta Chain, Quant, S: <1 m[IU]/mL (ref <5)

## 2022-08-14 MED ORDER — ONDANSETRON HCL 4 MG PO TABS: 4.0000 mg | ORAL_TABLET | Freq: Four times a day (QID) | ORAL | 0 refills | Status: AC

## 2022-08-14 MED ORDER — HYDROMORPHONE HCL 1 MG/ML IJ SOLN
1.0000 mg | Freq: Once | INTRAMUSCULAR | Status: AC
  Administered 2022-08-14: 1 mg via INTRAVENOUS
  Filled 2022-08-14: qty 1

## 2022-08-14 MED ORDER — LACTATED RINGERS IV BOLUS
1000.0000 mL | Freq: Once | INTRAVENOUS | Status: AC
  Administered 2022-08-14: 1000 mL via INTRAVENOUS

## 2022-08-14 MED ORDER — LACTATED RINGERS IV SOLN: INTRAVENOUS | Status: DC

## 2022-08-14 NOTE — ED Provider Notes (Signed)
[facility] EMERGENCY DEPARTMENT Provider Note   CSN: [id number] Arrival date & time: 08/14/22  1204     History  Chief Complaint  Patient presents with   Back Pain   Emesis   Abdominal Pain    Natasha Byrd is a 37 y.o. female.  Patient is a 38 year old female with a history of anxiety depression, benzodiazepine abuse, recent D&C in October who is presenting today with complaints of abdominal pain, nausea vomiting.  She reports her symptoms started yesterday and have become worse.  She has had multiple episodes of emesis and the last one was about 30 minutes ago.  She describes pain throughout her abdomen and now a pressure in her chest that makes her chest feel like it is going to explode.  She is not having any cough or shortness of breath.  She did not know she was having a fever.  She does menses after her D&C but reports it has been light.  She denies bad food exposure.  She does not take any medications at home.  The history is provided by the patient and medical records.  Back Pain Associated symptoms: abdominal pain   Emesis Associated symptoms: abdominal pain   Abdominal Pain Associated symptoms: vomiting        Home Medications Prior to Admission medications   Medication Sig Start Date End Date Taking? Authorizing Provider  ondansetron (ZOFRAN) 4 MG tablet Take 1 tablet (4 mg total) by mouth every 6 (six) hours. 08/14/22  Yes Blanchie Dessert, MD  ibuprofen (ADVIL) 600 MG tablet Take 1 tablet (600 mg total) by mouth every 6 (six) hours as needed for cramping or mild pain. 05/18/22   Griffin Basil, MD  oxyCODONE (OXY IR/ROXICODONE) 5 MG immediate release tablet Take 1 tablet (5 mg total) by mouth every 4 (four) hours as needed for severe pain or breakthrough pain. 05/18/22   Griffin Basil, MD      Allergies    Patient has no known allergies.    Review of Systems   Review of Systems  Gastrointestinal:  Positive for abdominal pain and  vomiting.  Musculoskeletal:  Positive for back pain.    Physical Exam Updated Vital Signs BP (!) 136/97 (BP Location: Right Arm)   Pulse 85   Temp 100 F (37.8 C) (Oral)   Resp 15   LMP 03/17/2022 (Approximate)   SpO2 99%  Physical Exam Vitals and nursing note reviewed.  Constitutional:      General: She is not in acute distress.    Appearance: She is well-developed.     Comments: Appears uncomfortable  HENT:     Head: Normocephalic and atraumatic.  Eyes:     Pupils: Pupils are equal, round, and reactive to light.  Cardiovascular:     Rate and Rhythm: Normal rate and regular rhythm.     Heart sounds: Normal heart sounds. No murmur heard.    No friction rub.  Pulmonary:     Effort: Pulmonary effort is normal.     Breath sounds: Normal breath sounds. No wheezing or rales.  Abdominal:     General: Bowel sounds are normal. There is no distension.     Palpations: Abdomen is soft.     Tenderness: There is abdominal tenderness. There is no guarding or rebound.     Comments: Diffuse tenderness but no rebound or guarding  Musculoskeletal:        General: No tenderness. Normal range of motion.  Comments: No edema  Skin:    General: Skin is warm and dry.     Findings: No rash.  Neurological:     Mental Status: She is alert and oriented to person, place, and time. Mental status is at baseline.     Cranial Nerves: No cranial nerve deficit.  Psychiatric:        Behavior: Behavior normal.     ED Results / Procedures / Treatments   Labs (all labs ordered are listed, but only abnormal results are displayed) Labs Reviewed  CBC WITH DIFFERENTIAL/PLATELET - Abnormal; Notable for the following components:      Result Value   WBC 3.8 (*)    RBC 3.67 (*)    Hemoglobin 11.9 (*)    HCT 35.8 (*)    Lymphs Abs 0.5 (*)    All other components within normal limits  COMPREHENSIVE METABOLIC PANEL - Abnormal; Notable for the following components:   Potassium 3.2 (*)    Glucose, Bld  117 (*)    Calcium 8.8 (*)    All other components within normal limits  URINALYSIS, ROUTINE W REFLEX MICROSCOPIC - Abnormal; Notable for the following components:   APPearance HAZY (*)    Protein, ur 30 (*)    All other components within normal limits  RAPID URINE DRUG SCREEN, HOSP PERFORMED - Abnormal; Notable for the following components:   Tetrahydrocannabinol POSITIVE (*)    All other components within normal limits  RESP PANEL BY RT-PCR (RSV, FLU A&B, COVID)  RVPGX2  LIPASE, BLOOD  HCG, QUANTITATIVE, PREGNANCY    EKG EKG Interpretation  Date/Time:  Monday August 14 2022 13:23:05 EST Ventricular Rate:  87 PR Interval:  160 QRS Duration: 82 QT Interval:  342 QTC Calculation: 411 R Axis:   46 Text Interpretation: Normal sinus rhythm Nonspecific T wave abnormality No significant change since last tracing When compared with ECG of 20-May-2021 11:29, PREVIOUS ECG IS PRESENT Confirmed by Blanchie Dessert [phone]) on 08/14/2022 1:48:19 PM  Radiology No results found.  Procedures Procedures    Medications Ordered in ED Medications  lactated ringers infusion (has no administration in time range)  lactated ringers bolus 1,000 mL (0 mLs Intravenous Stopped 08/14/22 1439)  HYDROmorphone (DILAUDID) injection 1 mg (1 mg Intravenous Given 08/14/22 1326)    ED Course/ Medical Decision Making/ A&P                           Medical Decision Making Risk Prescription drug management.   Pt presenting today with a complaint that caries a high risk for morbidity and mortality.  Here today with abdominal pain and vomiting.  Patient is found to be febrile today but otherwise vital signs are stable.  She has diffuse abdominal tenderness and lower suspicion for perforation, diverticulitis or appendicitis.  Concern for viral etiology versus medication withdrawal versus bad food exposure.  Patient given supportive care and symptomatic control.  Labs are pending.  Will reassess after medications  to see if patient indicates imaging.  2:59 PM I independently interpreted patient's labs today and UDS is positive for marijuana, UA within normal limits, hCG negative, CBC with leukopenia which is unchanged, stable hemoglobin, CMP with mild hypokalemia of 3.2 but otherwise normal, lipase within normal limits. 2:59 PM After first round of meds pt feeling better.  Sipping on ginger ale.  Viral panel neg.        Final Clinical Impression(s) / ED Diagnoses Final diagnoses:  Nausea  and vomiting, unspecified vomiting type    Rx / DC Orders ED Discharge Orders          Ordered    ondansetron (ZOFRAN) 4 MG tablet  Every 6 hours        08/14/22 1457              Gwyneth Sprout, MD 08/14/22 1459

## 2022-08-14 NOTE — ED Triage Notes (Signed)
Pt bib ems from home with lower abdominal pain, low back pain and emesis since yesterday. VSS with ems.  132/70 HR 82 97% RA CBG 112

## 2022-08-14 NOTE — ED Provider Triage Note (Signed)
Emergency Medicine Provider Triage Evaluation Note  Natasha Byrd , a 37 y.o. female  was evaluated in triage.  Pt complains of abd pain. Pain to lower abd and back with nausea and vomiting x 2 days.  Hx of polysubstance use.  Review of Systems  Positive: As above Negative: As above  Physical Exam  BP (!) 136/97 (BP Location: Right Arm)   Pulse 85   Temp (!) 100.7 F (38.2 C)   Resp 15   LMP 03/17/2022 (Approximate)   SpO2 99%  Gen:   Awake, no distress   Resp:  Normal effort  MSK:   Moves extremities without difficulty  Other:    Medical Decision Making  Medically screening exam initiated at 12:07 PM.  Appropriate orders placed.  Natasha Byrd was informed that the remainder of the evaluation will be completed by another provider, this initial triage assessment does not replace that evaluation, and the importance of remaining in the ED until their evaluation is complete.     Domenic Moras, PA-C 08/14/22 1209

## 2022-08-14 NOTE — Discharge Instructions (Addendum)
Your blood work looks good today but you may have a virus causing your symptoms.  Make sure you eat a bland diet and drink fluids regularly.  You were given a prescription for nausea medication to use as needed.

## 2022-08-15 ENCOUNTER — Emergency Department (HOSPITAL_COMMUNITY)
Admission: EM | Admit: 2022-08-15 | Discharge: 2022-08-15 | Discharge status: Against Medical Advice | Payer: Medicaid Other | Attending: Emergency Medicine | Admitting: Emergency Medicine

## 2022-08-15 ENCOUNTER — Other Ambulatory Visit: Payer: Self-pay

## 2022-08-15 ENCOUNTER — Encounter (HOSPITAL_COMMUNITY): Payer: Self-pay

## 2022-08-15 ENCOUNTER — Emergency Department (HOSPITAL_COMMUNITY): Payer: Medicaid Other

## 2022-08-15 ENCOUNTER — Emergency Department (HOSPITAL_COMMUNITY)
Admission: EM | Admit: 2022-08-15 | Discharge: 2022-08-16 | Discharge status: Against Medical Advice | Payer: Medicaid Other | Source: Home / Self Care

## 2022-08-15 DIAGNOSIS — R531 Weakness: Secondary | ICD-10-CM | POA: Diagnosis not present

## 2022-08-15 DIAGNOSIS — R109 Unspecified abdominal pain: Secondary | ICD-10-CM | POA: Insufficient documentation

## 2022-08-15 DIAGNOSIS — R519 Headache, unspecified: Secondary | ICD-10-CM | POA: Diagnosis not present

## 2022-08-15 DIAGNOSIS — M791 Myalgia, unspecified site: Secondary | ICD-10-CM | POA: Insufficient documentation

## 2022-08-15 DIAGNOSIS — Z5321 Procedure and treatment not carried out due to patient leaving prior to being seen by health care provider: Secondary | ICD-10-CM | POA: Insufficient documentation

## 2022-08-15 DIAGNOSIS — R509 Fever, unspecified: Secondary | ICD-10-CM | POA: Diagnosis not present

## 2022-08-15 DIAGNOSIS — R110 Nausea: Secondary | ICD-10-CM | POA: Insufficient documentation

## 2022-08-15 LAB — COMPREHENSIVE METABOLIC PANEL
ALT: 11 U/L (ref 0–44)
AST: 19 U/L (ref 15–41)
Albumin: 3.7 g/dL (ref 3.5–5.0)
Alkaline Phosphatase: 43 U/L (ref 38–126)
Anion gap: 8 (ref 5–15)
BUN: 9 mg/dL (ref 6–20)
CO2: 21 mmol/L — ABNORMAL LOW (ref 22–32)
Calcium: 8.8 mg/dL — ABNORMAL LOW (ref 8.9–10.3)
Chloride: 103 mmol/L (ref 98–111)
Creatinine, Ser: 0.73 mg/dL (ref 0.44–1.00)
GFR, Estimated: >60 mL/min (ref >60)
Glucose, Bld: 91 mg/dL (ref 70–99)
Potassium: 2.8 mmol/L — ABNORMAL LOW (ref 3.5–5.1)
Sodium: 132 mmol/L — ABNORMAL LOW (ref 135–145)
Total Bilirubin: 0.6 mg/dL (ref 0.3–1.2)
Total Protein: 7.1 g/dL (ref 6.5–8.1)

## 2022-08-15 LAB — LIPASE, BLOOD: Lipase: 32 U/L (ref 11–51)

## 2022-08-15 LAB — CBC WITH DIFFERENTIAL/PLATELET
Abs Immature Granulocytes: 0 10*3/uL (ref 0.00–0.07)
Basophils Absolute: 0 10*3/uL (ref 0.0–0.1)
Basophils Relative: 1 %
Eosinophils Absolute: 0 10*3/uL (ref 0.0–0.5)
Eosinophils Relative: 0 %
HCT: 37.1 % (ref 36.0–46.0)
Hemoglobin: 13.1 g/dL (ref 12.0–15.0)
Immature Granulocytes: 0 %
Lymphocytes Relative: 27 %
Lymphs Abs: 1 10*3/uL (ref 0.7–4.0)
MCH: 33 pg (ref 26.0–34.0)
MCHC: 35.3 g/dL (ref 30.0–36.0)
MCV: 93.5 fL (ref 80.0–100.0)
Monocytes Absolute: 0.4 10*3/uL (ref 0.1–1.0)
Monocytes Relative: 11 %
Neutro Abs: 2.3 10*3/uL (ref 1.7–7.7)
Neutrophils Relative %: 61 %
Platelets: 118 10*3/uL — ABNORMAL LOW (ref 150–400)
RBC: 3.97 MIL/uL (ref 3.87–5.11)
RDW: 11.9 % (ref 11.5–15.5)
WBC: 3.7 10*3/uL — ABNORMAL LOW (ref 4.0–10.5)
nRBC: 0 % (ref 0.0–0.2)

## 2022-08-15 MED ORDER — ACETAMINOPHEN 325 MG PO TABS
650.0000 mg | ORAL_TABLET | Freq: Once | ORAL | Status: AC | PRN
  Administered 2022-08-15: 650 mg via ORAL
  Filled 2022-08-15: qty 2

## 2022-08-15 MED ORDER — KETOROLAC TROMETHAMINE 30 MG/ML IJ SOLN: 30.0000 mg | Freq: Once | INTRAMUSCULAR | Status: DC

## 2022-08-15 MED ORDER — KETOROLAC TROMETHAMINE 30 MG/ML IJ SOLN
30.0000 mg | Freq: Once | INTRAMUSCULAR | Status: AC
  Administered 2022-08-15: 30 mg via INTRAMUSCULAR
  Filled 2022-08-15: qty 1

## 2022-08-15 MED ORDER — ACETAMINOPHEN 500 MG PO TABS
1000.0000 mg | ORAL_TABLET | Freq: Once | ORAL | Status: AC
  Administered 2022-08-15: 1000 mg via ORAL
  Filled 2022-08-15: qty 2

## 2022-08-15 MED ORDER — ONDANSETRON 4 MG PO TBDP
4.0000 mg | ORAL_TABLET | Freq: Once | ORAL | Status: AC | PRN
  Administered 2022-08-15: 4 mg via ORAL
  Filled 2022-08-15: qty 1

## 2022-08-15 NOTE — ED Notes (Signed)
Declines EKG

## 2022-08-15 NOTE — ED Triage Notes (Addendum)
Per EMS- Patient is home. Patient was seen yesterday at [facility]. Patient c/o generalized body pain, weakness. Patient's T. was 102.0 orally.  While in triage, patient c/o headache and nausea.

## 2022-08-15 NOTE — ED Notes (Signed)
Pt called for someone to pick her up. Pt seen leaving the ED lobby

## 2022-08-15 NOTE — ED Provider Triage Note (Signed)
Emergency Medicine Provider Triage Evaluation Note  Natasha Byrd , a 37 y.o. female  was evaluated in triage.  Pt complains of abdominal pain and fever.  Patient was seen in the emergency department yesterday for similar complaints and reports that she had some relief with Dilaudid pills given to her but only helped alleviate pain for several hours.  Patient reported that she was also given Tylenol proximately 8 AM and is not wanting to repeat this medication as she feels like it does not control her pain well.  Denies dysuria, hematuria, nausea, vomiting, headaches, dizziness.  Review of Systems  Positive: As above Negative: As above  Physical Exam  BP (!) 147/97   Pulse (!) 108   Temp (!) 102 F (38.9 C) (Oral)   Resp 16   LMP 03/17/2022 (Approximate)   SpO2 94%  Gen:   Awake, uncomfortable and difficult to cooperate Resp:  Normal effort clear to auscultation bilaterally MSK:   Moves extremities without difficulty Other:    Medical Decision Making  Medically screening exam initiated at 8:29 PM.  Appropriate orders placed.  Natasha Byrd was informed that the remainder of the evaluation will be completed by another provider, this initial triage assessment does not replace that evaluation, and the importance of remaining in the ED until their evaluation is complete.     Natasha Heller, PA-C 08/15/22 2037

## 2022-08-15 NOTE — ED Triage Notes (Signed)
Reports fevers, generalized body aches, and shob with exertion.   Says she was seen yesterday and today and nobody finds anything.

## 2022-08-15 NOTE — ED Notes (Signed)
Patient very rude and cursing in triage. Patient demanding pain medication. Patient stated, "Can't you see I'm f------ sick."

## 2022-08-16 NOTE — ED Notes (Signed)
Called for vitals. No response 

## 2022-10-02 IMAGING — CR DG CHEST 2V
2 series · 2 of 2 positions shown · non-contrast
Comparison: 08/11/2020

CLINICAL DATA: Shortness of breath.

EXAM:
CHEST - 2 VIEW

[chest lat]
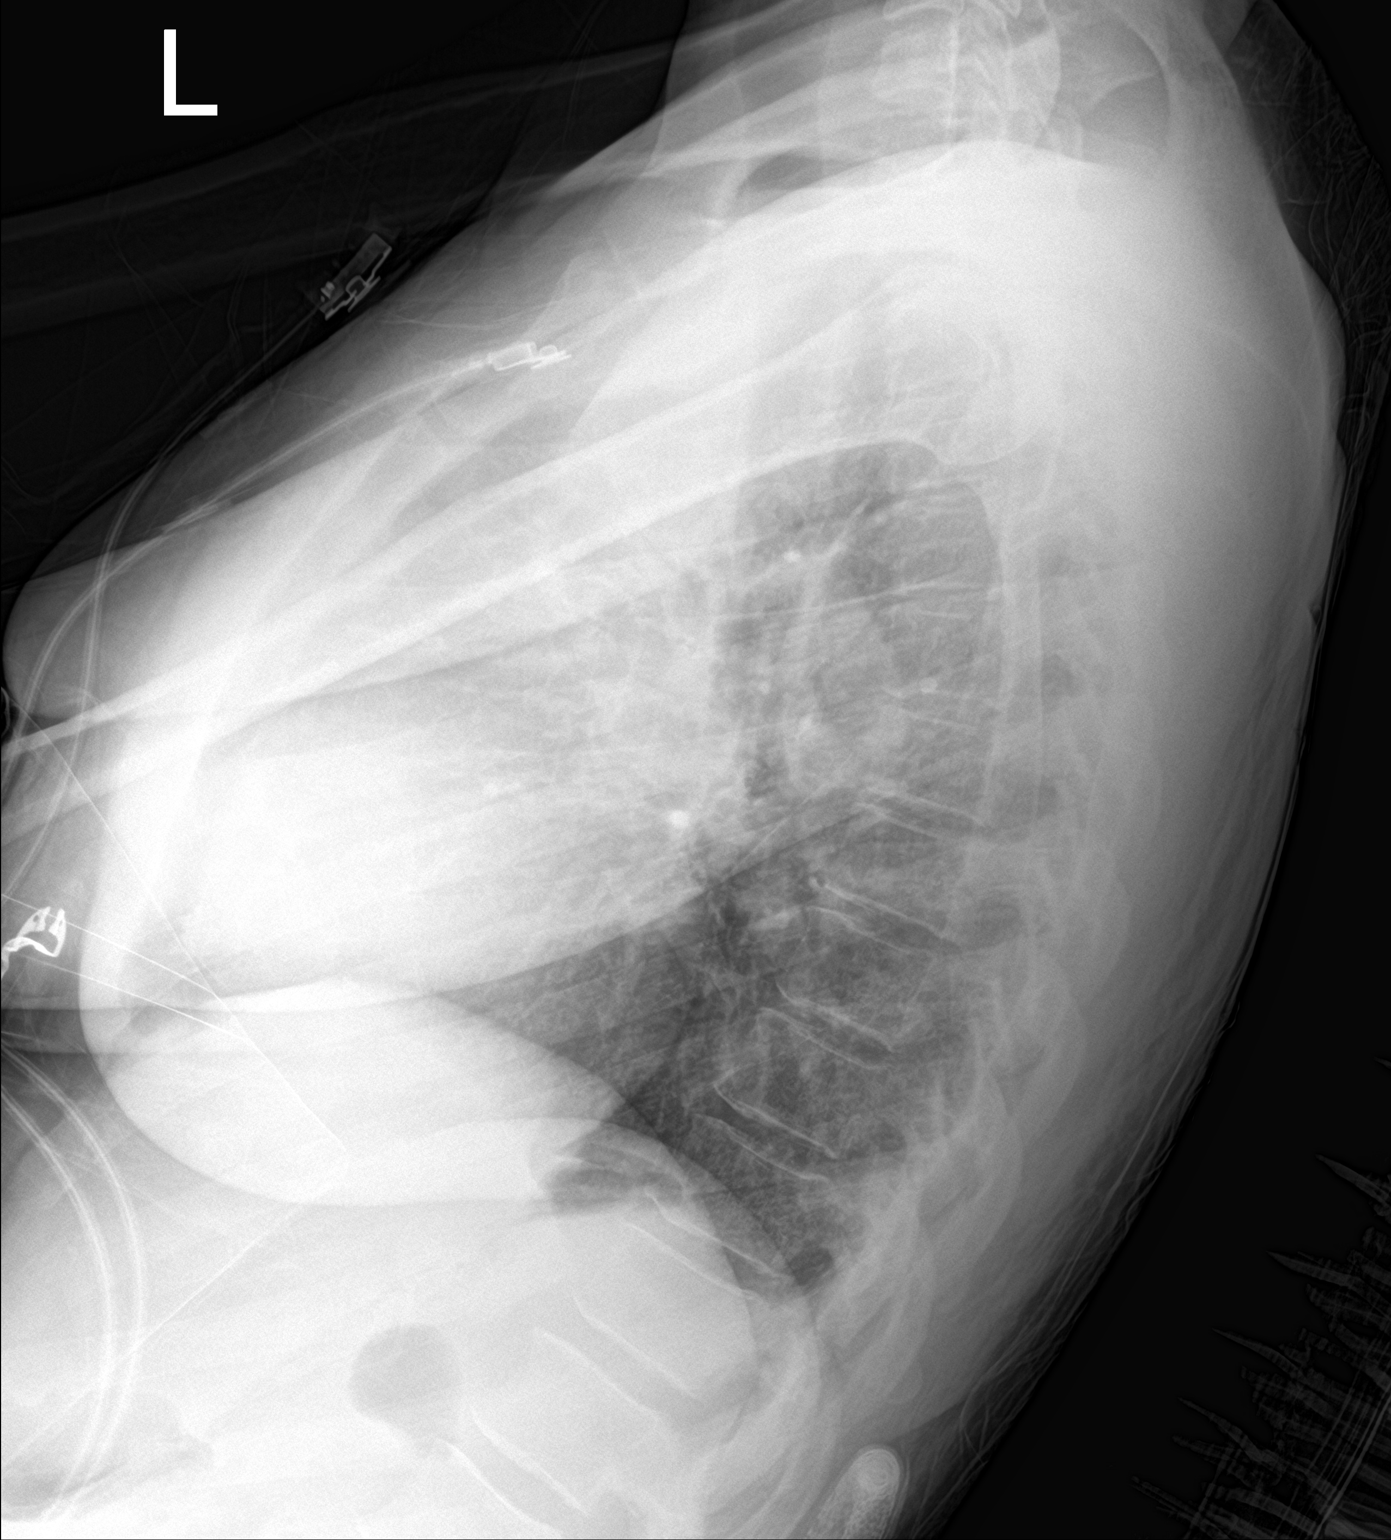

[chest ap]
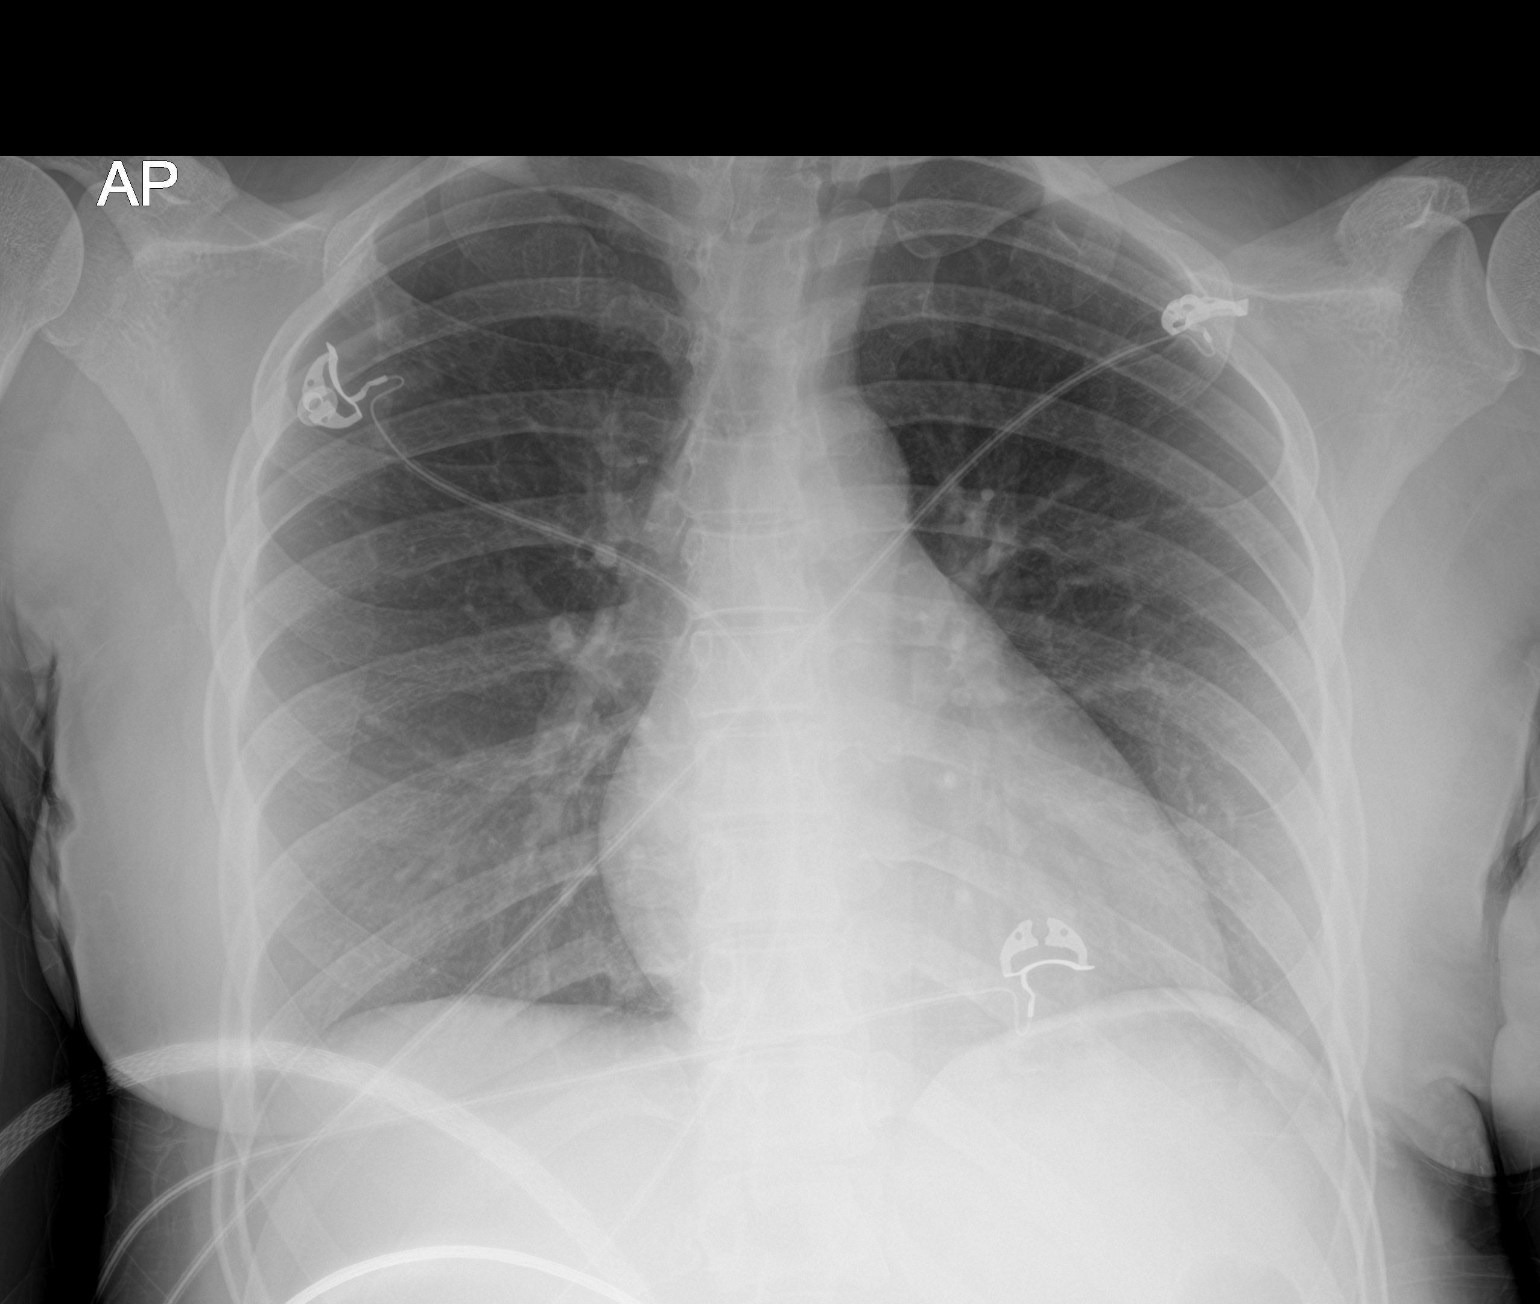

[2 of 2 positions shown; findings below may reference images not displayed]

FINDINGS: The cardiac silhouette, mediastinal and hilar contours are normal.
The lungs are clear. No pleural effusions. No pulmonary lesions. The
bony thorax is intact.
IMPRESSION: No acute cardiopulmonary findings.

## 2022-12-27 ENCOUNTER — Emergency Department (HOSPITAL_COMMUNITY): Payer: Medicaid Other

## 2022-12-27 ENCOUNTER — Other Ambulatory Visit: Payer: Self-pay

## 2022-12-27 ENCOUNTER — Encounter (HOSPITAL_COMMUNITY): Payer: Self-pay

## 2022-12-27 ENCOUNTER — Emergency Department (HOSPITAL_COMMUNITY)
Admission: EM | Admit: 2022-12-27 | Discharge: 2022-12-27 | Disposition: A | Discharge status: Home / Self Care | Payer: Medicaid Other | Attending: Emergency Medicine | Admitting: Emergency Medicine

## 2022-12-27 DIAGNOSIS — Y909 Presence of alcohol in blood, level not specified: Secondary | ICD-10-CM | POA: Diagnosis not present

## 2022-12-27 DIAGNOSIS — F10920 Alcohol use, unspecified with intoxication, uncomplicated: Secondary | ICD-10-CM | POA: Diagnosis present

## 2022-12-27 LAB — COMPREHENSIVE METABOLIC PANEL
ALT: 18 U/L (ref 0–44)
AST: 28 U/L (ref 15–41)
Albumin: 4.2 g/dL (ref 3.5–5.0)
Alkaline Phosphatase: 59 U/L (ref 38–126)
Anion gap: 17 — ABNORMAL HIGH (ref 5–15)
BUN: 14 mg/dL (ref 6–20)
CO2: 22 mmol/L (ref 22–32)
Calcium: 10 mg/dL (ref 8.9–10.3)
Chloride: 103 mmol/L (ref 98–111)
Creatinine, Ser: 1 mg/dL (ref 0.44–1.00)
GFR, Estimated: >60 mL/min (ref >60)
Glucose, Bld: 149 mg/dL — ABNORMAL HIGH (ref 70–99)
Potassium: 3.9 mmol/L (ref 3.5–5.1)
Sodium: 142 mmol/L (ref 135–145)
Total Bilirubin: 0.5 mg/dL (ref 0.3–1.2)
Total Protein: 7.9 g/dL (ref 6.5–8.1)

## 2022-12-27 LAB — CBC WITH DIFFERENTIAL/PLATELET
Abs Immature Granulocytes: 0.01 10*3/uL (ref 0.00–0.07)
Basophils Absolute: 0 10*3/uL (ref 0.0–0.1)
Basophils Relative: 1 %
Eosinophils Absolute: 0 10*3/uL (ref 0.0–0.5)
Eosinophils Relative: 1 %
HCT: 40 % (ref 36.0–46.0)
Hemoglobin: 13.7 g/dL (ref 12.0–15.0)
Immature Granulocytes: 0 %
Lymphocytes Relative: 34 %
Lymphs Abs: 2.4 10*3/uL (ref 0.7–4.0)
MCH: 32.5 pg (ref 26.0–34.0)
MCHC: 34.3 g/dL (ref 30.0–36.0)
MCV: 95 fL (ref 80.0–100.0)
Monocytes Absolute: 0.6 10*3/uL (ref 0.1–1.0)
Monocytes Relative: 8 %
Neutro Abs: 4 10*3/uL (ref 1.7–7.7)
Neutrophils Relative %: 56 %
Platelets: 348 10*3/uL (ref 150–400)
RBC: 4.21 MIL/uL (ref 3.87–5.11)
RDW: 11.9 % (ref 11.5–15.5)
WBC: 7.1 10*3/uL (ref 4.0–10.5)
nRBC: 0 % (ref 0.0–0.2)

## 2022-12-27 LAB — LIPASE, BLOOD: Lipase: 29 U/L (ref 11–51)

## 2022-12-27 LAB — URINALYSIS, ROUTINE W REFLEX MICROSCOPIC
Bacteria, UA: NONE SEEN
Bilirubin Urine: NEGATIVE
Glucose, UA: NEGATIVE mg/dL
Hgb urine dipstick: NEGATIVE
Ketones, ur: NEGATIVE mg/dL
Leukocytes,Ua: NEGATIVE
Nitrite: NEGATIVE
Protein, ur: 30 mg/dL — AB
Specific Gravity, Urine: 1.019 (ref 1.005–1.030)
pH: 9 — ABNORMAL HIGH (ref 5.0–8.0)

## 2022-12-27 LAB — RAPID URINE DRUG SCREEN, HOSP PERFORMED
Amphetamines: NOT DETECTED
Barbiturates: NOT DETECTED
Benzodiazepines: POSITIVE — AB
Cocaine: NOT DETECTED
Opiates: NOT DETECTED
Tetrahydrocannabinol: POSITIVE — AB

## 2022-12-27 LAB — I-STAT BETA HCG BLOOD, ED (MC, WL, AP ONLY): I-stat hCG, quantitative: <5 m[IU]/mL (ref <5)

## 2022-12-27 LAB — ETHANOL: Alcohol, Ethyl (B): <10 mg/dL (ref <10)

## 2022-12-27 MED ORDER — LACTATED RINGERS IV BOLUS
1000.0000 mL | Freq: Once | INTRAVENOUS | Status: AC
  Administered 2022-12-27: 1000 mL via INTRAVENOUS

## 2022-12-27 MED ORDER — ONDANSETRON 4 MG PO TBDP: 4.0000 mg | ORAL_TABLET | Freq: Three times a day (TID) | ORAL | 0 refills | Status: AC | PRN

## 2022-12-27 MED ORDER — HALOPERIDOL LACTATE 5 MG/ML IJ SOLN
5.0000 mg | Freq: Once | INTRAMUSCULAR | Status: AC
  Administered 2022-12-27: 5 mg via INTRAVENOUS
  Filled 2022-12-27: qty 1

## 2022-12-27 MED ORDER — ONDANSETRON HCL 4 MG/2ML IJ SOLN
4.0000 mg | Freq: Once | INTRAMUSCULAR | Status: AC
  Administered 2022-12-27: 4 mg via INTRAVENOUS
  Filled 2022-12-27: qty 2

## 2022-12-27 MED ORDER — METOCLOPRAMIDE HCL 10 MG PO TABS
10.0000 mg | ORAL_TABLET | Freq: Once | ORAL | Status: AC
  Administered 2022-12-27: 10 mg via ORAL
  Filled 2022-12-27: qty 1

## 2022-12-27 MED ORDER — PROMETHAZINE HCL 25 MG RE SUPP: 25.0000 mg | Freq: Four times a day (QID) | RECTAL | 0 refills | Status: AC | PRN

## 2022-12-27 MED ORDER — ONDANSETRON 4 MG PO TBDP
8.0000 mg | ORAL_TABLET | Freq: Once | ORAL | Status: AC
  Administered 2022-12-27: 8 mg via ORAL
  Filled 2022-12-27: qty 2

## 2022-12-27 NOTE — ED Notes (Signed)
Patient vomiting, provider notified.

## 2022-12-27 NOTE — ED Provider Notes (Signed)
  Physical Exam  BP 138/80 (BP Location: Right Arm)   Pulse 69   Temp 98 F (36.7 C) (Oral)   Resp 14   LMP 03/17/2022 (Approximate)   SpO2 100%   Physical Exam Vitals and nursing note reviewed.  Constitutional:      General: She is not in acute distress.    Appearance: Normal appearance. She is normal weight. She is not ill-appearing.  HENT:     Head: Normocephalic and atraumatic.  Pulmonary:     Effort: Pulmonary effort is normal. No respiratory distress.  Abdominal:     General: Abdomen is flat.  Musculoskeletal:        General: Normal range of motion.     Cervical back: Neck supple.  Skin:    General: Skin is warm and dry.  Neurological:     General: No focal deficit present.     Mental Status: She is alert and oriented to person, place, and time.     Comments: No tremors  Psychiatric:        Mood and Affect: Mood normal.        Behavior: Behavior normal.     Procedures  Procedures  ED Course / MDM   Clinical Course as of 12/27/22 1839  Wed Dec 27, 2022  1827 Patient resting comfortably in bed.  She did have 1 more episode of emesis.  Was given Reglan and has been resting comfortably since that time.  Attempted to contact patient's cousin who will be transporting patient home.  Cousin did not answer. [AS]    Clinical Course User Index [AS] Brealyn Baril, Edsel Petrin, PA-C   Medical Decision Making Amount and/or Complexity of Data Reviewed Labs: ordered.  Risk Prescription drug management.  Assumed care at shift change from [facility] [place].  Please see his note for full HPI.  In short, 37 year old female presenting to the ED for evaluation of recent alcohol intoxication.  Drink approximately 1 gallon of tequila yesterday.  No history of withdrawals or seizures.  Current plan is to metabolize to freedom.  1830-cousin is now at bedside.  I discussed patient's condition with the patient and the cousin.  Shortly encouraged them to take the nausea medicine, eat an easy  to digest diet and avoid alcohol.  Also encouraged thiamine and folate supplementation.  Return precautions were discussed.  Stable at discharge.   At this time there does not appear to be any evidence of an acute emergency medical condition and the patient appears stable for discharge with appropriate outpatient follow up. Diagnosis was discussed with patient who verbalizes understanding of care plan and is agreeable to discharge. I have discussed return precautions with patient and cosin who verbalizes understanding. Patient encouraged to follow-up with their PCP within 1 week. All questions answered.  Note: Portions of this report may have been transcribed using voice recognition software. Every effort was made to ensure accuracy; however, inadvertent computerized transcription errors may still be present.       Mora Bellman 12/27/22 1839    Benjiman Core, MD 12/27/22 2249

## 2022-12-27 NOTE — ED Notes (Signed)
Patient has been asleep the majority of the day, while awake patient was uncooperative and rude, with periodic vomiting, providers were aware.

## 2022-12-27 NOTE — ED Notes (Signed)
Patients IV partially un-taped, trying to secure her IV, patient is telling me to leave her alone, that I'm just standing there annoying her while I'm trying to tape, secure, and check patency of her IV.

## 2022-12-27 NOTE — ED Notes (Addendum)
Patient yelling at staff, moving all around in the bed, not attempting to get out of bed at this time. Patient has removed all of her monitoring equipment. Will continue to monitor.

## 2022-12-27 NOTE — ED Notes (Signed)
While collecting blood specimen patient states " hurry the fuck up, I'm cold, leave me alone."

## 2022-12-27 NOTE — Discharge Instructions (Addendum)
Workup is reassuring.  I have sent Phenergan into the pharmacy for you.  Return to the emergency for any concerning symptoms.  A prescription for Zofran was already sent, however this did not work for you.  Do not pick this medication up.

## 2022-12-27 NOTE — ED Triage Notes (Signed)
Patient BIB [facility] from family members house. EMS called for intoxication, patient also vomited 3x.

## 2022-12-27 NOTE — ED Notes (Signed)
Patient refused to stay for last set of vitals and to receive discharge papers.  Left ambulatory, in stable condition with her cousin.

## 2022-12-27 NOTE — ED Provider Notes (Signed)
[place] EMERGENCY DEPARTMENT AT [facility] - Inpatient Provider Note   CSN: [id number] Arrival date & time: 12/27/22  4098     History  Chief Complaint  Patient presents with   Alcohol Intoxication    Natasha Byrd is a 37 y.o. female.  37 year old female presents with EMS for evaluation of intoxication.  She presents from family's home.  History limited due to intoxication.  Patient repeatedly states she cannot breathe.  Moving around in bed.  Has normal voice.  The history is provided by the patient. No language interpreter was used.       Home Medications Prior to Admission medications   Medication Sig Start Date End Date Taking? Authorizing Provider  ibuprofen (ADVIL) 600 MG tablet Take 1 tablet (600 mg total) by mouth every 6 (six) hours as needed for cramping or mild pain. 05/18/22   Warden Fillers, MD  ondansetron (ZOFRAN) 4 MG tablet Take 1 tablet (4 mg total) by mouth every 6 (six) hours. 08/14/22   Gwyneth Sprout, MD  oxyCODONE (OXY IR/ROXICODONE) 5 MG immediate release tablet Take 1 tablet (5 mg total) by mouth every 4 (four) hours as needed for severe pain or breakthrough pain. 05/18/22   Warden Fillers, MD      Allergies    Patient has no known allergies.    Review of Systems   Review of Systems  Unable to perform ROS: Other (Intoxication)    Physical Exam Updated Vital Signs BP 101/78   Pulse 86   Resp (!) 26   LMP 03/17/2022 (Approximate)   SpO2 99%  Physical Exam Vitals and nursing note reviewed.  Constitutional:      General: She is not in acute distress.    Appearance: Normal appearance. She is not ill-appearing.  HENT:     Head: Normocephalic and atraumatic.     Nose: Nose normal.  Eyes:     Conjunctiva/sclera: Conjunctivae normal.  Pulmonary:     Effort: Pulmonary effort is normal. No respiratory distress.  Musculoskeletal:        General: No deformity.  Skin:    Findings: No rash.  Neurological:     Mental Status: She is  alert.     ED Results / Procedures / Treatments   Labs (all labs ordered are listed, but only abnormal results are displayed) Labs Reviewed  CBC WITH DIFFERENTIAL/PLATELET  COMPREHENSIVE METABOLIC PANEL  ETHANOL  LIPASE, BLOOD  URINALYSIS, ROUTINE W REFLEX MICROSCOPIC  I-STAT BETA HCG BLOOD, ED ([facility], [facility], [facility] ONLY)    EKG None  Radiology No results found.  Procedures Procedures    Medications Ordered in ED Medications  lactated ringers bolus 1,000 mL (1,000 mLs Intravenous New Bag/Given 12/27/22 0714)  ondansetron (ZOFRAN) injection 4 mg (4 mg Intravenous Given 12/27/22 0714)  haloperidol lactate (HALDOL) injection 5 mg (5 mg Intravenous Given 12/27/22 1191)    ED Course/ Medical Decision Making/ A&P                             Medical Decision Making Amount and/or Complexity of Data Reviewed Labs: ordered.  Risk Prescription drug management.   37 year old female presents with intoxication.  She presents from family's home.  She presents via EMS.  Has normal voice.  Is moving all of her extremities without any difficulty.  Does appear somewhat agitated.  Will give Haldol, Zofran, IV hydration.  Will reevaluate.  Call and answers appropriately on reevaluation.  Still nauseous.  No emesis.  Will give additional dose of Zofran.  Discussed with cousin who called EMS.  Workup overall reassuring.  CBC unremarkable, CMP with glucose of 149 otherwise without acute concerns.  hCG negative.  Ethanol level less than 10.  UA without evidence of UTI.  Lipase within normal limits.  As long as patient is arise she is appropriate for discharge.  Otherwise we will wait till she metabolizes to freedom.  Will help coordinate a ride for patient.  Patient can either be discharged once her ride gets here.  Or when she has metabolized to freedom.  Signed out to oncoming provider.  Final Clinical Impression(s) / ED Diagnoses Final diagnoses:  Alcoholic intoxication without complication (HCC)     Rx / DC Orders ED Discharge Orders          Ordered    ondansetron (ZOFRAN-ODT) 4 MG disintegrating tablet  Every 8 hours PRN        12/27/22 1510              Marita Kansas, PA-C 12/27/22 1511    Loetta Rough, MD 01/05/23 [phone]

## 2023-02-05 ENCOUNTER — Emergency Department (HOSPITAL_COMMUNITY)
Admission: EM | Admit: 2023-02-05 | Discharge: 2023-02-05 | Discharge status: Against Medical Advice | Payer: Medicaid Other | Attending: Emergency Medicine | Admitting: Emergency Medicine

## 2023-02-05 DIAGNOSIS — Z5321 Procedure and treatment not carried out due to patient leaving prior to being seen by health care provider: Secondary | ICD-10-CM | POA: Diagnosis not present

## 2023-02-05 DIAGNOSIS — R109 Unspecified abdominal pain: Secondary | ICD-10-CM | POA: Insufficient documentation

## 2023-02-05 NOTE — ED Notes (Signed)
Pt removed from floor due to failure to respond when name was called for triage.

## 2023-02-05 NOTE — ED Notes (Signed)
Pt called for triage x3 with no response 

## 2023-06-01 ENCOUNTER — Encounter (HOSPITAL_COMMUNITY): Payer: Self-pay | Admitting: Obstetrics & Gynecology

## 2023-06-01 ENCOUNTER — Inpatient Hospital Stay (HOSPITAL_COMMUNITY)
Admission: AD | Admit: 2023-06-01 | Discharge: 2023-06-01 | Discharge status: Against Medical Advice | Payer: MEDICAID | Attending: Obstetrics & Gynecology | Admitting: Obstetrics & Gynecology

## 2023-06-01 DIAGNOSIS — R1084 Generalized abdominal pain: Secondary | ICD-10-CM

## 2023-06-01 DIAGNOSIS — Z3202 Encounter for pregnancy test, result negative: Secondary | ICD-10-CM | POA: Diagnosis present

## 2023-06-01 DIAGNOSIS — Z87891 Personal history of nicotine dependence: Secondary | ICD-10-CM | POA: Diagnosis not present

## 2023-06-01 LAB — POCT PREGNANCY, URINE: Preg Test, Ur: NEGATIVE

## 2023-06-01 LAB — HCG, QUANTITATIVE, PREGNANCY: hCG, Beta Chain, Quant, S: 1 m[IU]/mL (ref <5)

## 2023-06-01 MED ORDER — PREPLUS 27-1 MG PO TABS
1.0000 | ORAL_TABLET | Freq: Every day | ORAL | 13 refills | Status: AC
Start: 2023-06-01 — End: ?

## 2023-06-01 NOTE — MAU Note (Signed)
.  Natasha Byrd is a 37 y.o. at Unknown here in MAU reporting: missed her period ( thinks her LMP ws beg or middle of September)c/o abd pain x 1 week . Denies any vag bleeding or discharge. Took 2 HPT and both were faintly positive. MAU test negative. LMP: 04/25/2023(aprox) Onset of complaint: 1 week Pain score: 8 Vitals:   06/01/23 1557  BP: 134/82  Pulse: 85  Resp: 18  Temp: 98.4 F (36.9 C)     FHT:n/a Lab orders placed from triage:  UPT

## 2023-06-01 NOTE — Progress Notes (Signed)
Not in lobby

## 2023-06-01 NOTE — Progress Notes (Signed)
Not in lobby x2.

## 2023-06-01 NOTE — Progress Notes (Signed)
Not in lobby x 3  dismiss as AMA

## 2023-06-01 NOTE — MAU Provider Note (Addendum)
Chief Complaint: Abdominal Pain   Event Date/Time   First Provider Initiated Contact with Patient 06/01/23 1614      SUBJECTIVE HPI: Natasha Byrd is a 37 y.o. G3P2002 at ~3 weeks by LMP who presents to maternity admissions reporting abdominal pain, onset today.  Patient took several pregnancy tests at home which she states are positive. UPT here is negative. States she has had this happen with each pregnancy that she finds out early and UPT will be negative with positive HCG.  Patient would like to start PNV.  HPI  Past Medical History:  Diagnosis Date   Attention deficit disorder with hyperactivity    Benzodiazepine abuse (HCC)    and ETOH abuse   Chlamydia    as teen   Depression    doing good now   Headache    History of suicidal ideation    Homicidal ideations    History of,   UTI (urinary tract infection)    Past Surgical History:  Procedure Laterality Date   DILATION AND EVACUATION N/A 05/18/2022   Procedure: DILATATION AND EVACUATION;  Surgeon: Warden Fillers, MD;  Location: [facility] OR;  Service: Gynecology;  Laterality: N/A;   TONSILLECTOMY     Social History   Socioeconomic History   Marital status: Single    Spouse name: Not on file   Number of children: Not on file   Years of education: Not on file   Highest education level: Not on file  Occupational History   Not on file  Tobacco Use   Smoking status: Former    Current packs/day: 0.30    Types: Cigarettes   Smokeless tobacco: Never   Tobacco comments:    Quit smoking cig Spring 2023  Vaping Use   Vaping status: Never Used  Substance and Sexual Activity   Alcohol use: Not Currently    Alcohol/week: 4.0 standard drinks of alcohol    Types: 4 Cans of beer per week    Comment: 2 yrs ago   Drug use: Yes    Types: Marijuana    Comment: none since incarcerated (~2020)   Sexual activity: Not Currently    Birth control/protection: None  Other Topics Concern   Not on file  Social History Narrative    ** Merged History Encounter **       Social Determinants of Health   Financial Resource Strain: Not on file  Food Insecurity: Not on file  Transportation Needs: Not on file  Physical Activity: Not on file  Stress: Not on file  Social Connections: Not on file  Intimate Partner Violence: Not on file   No current facility-administered medications on file prior to encounter.   Current Outpatient Medications on File Prior to Encounter  Medication Sig Dispense Refill   ibuprofen (ADVIL) 600 MG tablet Take 1 tablet (600 mg total) by mouth every 6 (six) hours as needed for cramping or mild pain. 30 tablet 1   ondansetron (ZOFRAN) 4 MG tablet Take 1 tablet (4 mg total) by mouth every 6 (six) hours. 12 tablet 0   ondansetron (ZOFRAN-ODT) 4 MG disintegrating tablet Take 1 tablet (4 mg total) by mouth every 8 (eight) hours as needed for nausea or vomiting. 20 tablet 0   oxyCODONE (OXY IR/ROXICODONE) 5 MG immediate release tablet Take 1 tablet (5 mg total) by mouth every 4 (four) hours as needed for severe pain or breakthrough pain. 4 tablet 0   promethazine (PHENERGAN) 25 MG suppository Place 1 suppository (25 mg  total) rectally every 6 (six) hours as needed for nausea or vomiting. 12 each 0   No Known Allergies  ROS:  Pertinent positives/negatives listed above.  I have reviewed patient's Past Medical Hx, Surgical Hx, Family Hx, Social Hx, medications and allergies.   Physical Exam  Patient Vitals for the past 24 hrs:  BP Temp Pulse Resp Height Weight  06/01/23 1557 134/82 98.4 F (36.9 C) 85 18 5\' 6"  (1.676 m) 80.5 kg   Constitutional: Well-developed, well-nourished female in no acute distress. Visibly frustrated Cardiovascular: normal rate Respiratory: normal effort GI: Abd soft, non-tender. Pos BS x 4 MS: Extremities nontender, no edema, normal ROM Neurologic: Alert and oriented x 4  LAB RESULTS Results for orders placed or performed during the hospital encounter of 06/01/23 (from  the past 24 hour(s))  Pregnancy, urine POC     Status: None   Collection Time: 06/01/23  3:47 PM  Result Value Ref Range   Preg Test, Ur NEGATIVE NEGATIVE  hCG, quantitative, pregnancy     Status: None   Collection Time: 06/01/23  4:30 PM  Result Value Ref Range   hCG, Beta Chain, Quant, S 1 <5 mIU/mL      IMAGING No results found.  MAU Management/MDM: Orders Placed This Encounter  Procedures   hCG, quantitative, pregnancy   Pregnancy, urine POC    Meds ordered this encounter  Medications   Prenatal Vit-Fe Fumarate-FA (PREPLUS) 27-1 MG TABS    Sig: Take 1 tablet by mouth daily.    Dispense:  30 tablet    Refill:  13    Patient presents with abdominal pain and negative UPT here in MAU. I discussed with her that per unit policy, she will need to be assessed in main ER. She verbalized frustration with this and asks for HCG and assessment here. I discussed that she very may well be so early pregnant that a UPT is negative which is consistent with her LMP, however I cannot be sure of this without more information. I also discussed with her that I cannot perform any further work-up without a known pregnancy as we are in the Maternity Assessment Unit.  I did order HCG. I let patient know that I cannot perform any further assessment until this value is back and positive which may take several hours. She does not want to wait for this result or proceed to main ER at this time. I encouraged her to wait given her abdominal pain or proceed to main ER. She declines this. As she is hemodynamically stable and able to verbalize risks of leaving without further assessment, I cannot keep her here at this time.  Did prescribe PNV as these are beneficial if she is pregnant or trying to become pregnant.  HCG returned 1 which is consistent with no pregnancy at this time. Attempted to pull patient back to discuss results; she had left AMA.  ASSESSMENT 1. Generalized abdominal pain   2. Urine pregnancy  test negative     PLAN Discharge home with strict return precautions. Allergies as of 06/01/2023   No Known Allergies      Medication List     STOP taking these medications    ibuprofen 600 MG tablet Commonly known as: ADVIL   oxyCODONE 5 MG immediate release tablet Commonly known as: Oxy IR/ROXICODONE       TAKE these medications    ondansetron 4 MG disintegrating tablet Commonly known as: ZOFRAN-ODT Take 1 tablet (4 mg total) by mouth every  8 (eight) hours as needed for nausea or vomiting.   ondansetron 4 MG tablet Commonly known as: ZOFRAN Take 1 tablet (4 mg total) by mouth every 6 (six) hours.   PrePLUS 27-1 MG Tabs Take 1 tablet by mouth daily.   promethazine 25 MG suppository Commonly known as: PHENERGAN Place 1 suppository (25 mg total) rectally every 6 (six) hours as needed for nausea or vomiting.         Wylene Simmer, MD OB Fellow 06/01/2023  6:10 PM

## 2024-06-13 ENCOUNTER — Inpatient Hospital Stay (HOSPITAL_COMMUNITY)
Admission: AD | Admit: 2024-06-13 | Discharge: 2024-06-13 | Discharge status: Against Medical Advice | Attending: Obstetrics and Gynecology | Admitting: Obstetrics and Gynecology

## 2024-06-13 ENCOUNTER — Encounter (HOSPITAL_COMMUNITY): Payer: Self-pay | Admitting: Obstetrics and Gynecology

## 2024-06-13 DIAGNOSIS — N939 Abnormal uterine and vaginal bleeding, unspecified: Secondary | ICD-10-CM

## 2024-06-13 DIAGNOSIS — Z3202 Encounter for pregnancy test, result negative: Secondary | ICD-10-CM | POA: Insufficient documentation

## 2024-06-13 DIAGNOSIS — O209 Hemorrhage in early pregnancy, unspecified: Secondary | ICD-10-CM | POA: Diagnosis present

## 2024-06-13 DIAGNOSIS — Z113 Encounter for screening for infections with a predominantly sexual mode of transmission: Secondary | ICD-10-CM | POA: Diagnosis present

## 2024-06-13 DIAGNOSIS — Z3201 Encounter for pregnancy test, result positive: Secondary | ICD-10-CM | POA: Diagnosis present

## 2024-06-13 DIAGNOSIS — Z789 Other specified health status: Secondary | ICD-10-CM | POA: Diagnosis not present

## 2024-06-13 LAB — WET PREP, GENITAL
Clue Cells Wet Prep HPF POC: NONE SEEN
Sperm: NONE SEEN
Trich, Wet Prep: NONE SEEN
WBC, Wet Prep HPF POC: <10 (ref <10)
Yeast Wet Prep HPF POC: NONE SEEN

## 2024-06-13 NOTE — MAU Note (Signed)
 Unable to pee at this time.  Pt to lobby, instructed to inform registration when able.

## 2024-06-13 NOTE — MAU Note (Signed)
 For to enter upt results into glucometer while pt here......SABRA results entered in as new pt.... neg UPT in MAU

## 2024-06-13 NOTE — MAU Note (Signed)
 Natasha Byrd is a 38 y.o. at here in MAU reporting: usually come on the beginning of the month, never had period in Oct. started bleeding the 28th, bled one day, spotted the next, still spotting.  2+HPT. LMP: 9/8 Onset of complaint: 10/28 Pain score: mild Vitals:   06/13/24 0847  BP: 122/77  Pulse: 75  Resp: 17  Temp: 99 F (37.2 C)  SpO2: 100%      Lab orders placed from triage:  ua, upt, vag swabs 

## 2024-06-13 NOTE — MAU Provider Note (Signed)
   S Ms. Natasha Byrd is a 38 y.o. [phone] patient who presents to MAU today with complaint of her last menstrual period being 04/21/2024 reporting she did not have a period in October but started bleeding on ten 2825 x 1 day and then spotted the next.  Patient states she had 2 positive home pregnancy tests but is not currently bleeding and denies any pain at this time.  Labs placed from triage were for urinalysis, urine pregnancy test, and vaginal swabs  to be self collected.    O BP 122/77 (BP Location: Right Arm)   Pulse 75   Temp 99 F (37.2 C) (Oral)   Resp 17   Ht 5' 6 (1.676 m)   Wt 80.7 kg   LMP 04/21/2024 (Approximate)   SpO2 100%   BMI 28.71 kg/m      MDM  LOW  U/A: Patient had reported she was unable to collect  Vaginal swabs : Wet prep negative, GC pending Urine POCT for pregnancy was negative  The plan was for me to evaluate the patient in triage and offer a serum HCG but the patient left prior to me performing her MSE and prior to the Serum HCG labs being drawn @0940      Orders Placed This Encounter  Procedures   Wet prep, genital    Standing Status:   Standing    Number of Occurrences:   1      Results for orders placed or performed during the hospital encounter of 06/13/24 (from the past 24 hours)  Wet prep, genital     Status: None   Collection Time: 06/13/24  8:58 AM   Specimen: Vaginal  Result Value Ref Range   Yeast Wet Prep HPF POC NONE SEEN NONE SEEN   Trich, Wet Prep NONE SEEN NONE SEEN   Clue Cells Wet Prep HPF POC NONE SEEN NONE SEEN   WBC, Wet Prep HPF POC <10 <10   Sperm NONE SEEN       ASSESSMENT/PLAN  Patient left without lab work/ MSE or provider evaluation at 0944 due to transportation issues @ 0940 per RN report   Olam Dalton, MSN, [facility], [facility]   ---------------------------------------------------------------------------------------------------  This chart was dictated  using voice recognition software, Dragon. Despite the best efforts of this provider to proofread and correct errors, errors may still occur which can change documentation meaning.

## 2024-06-16 LAB — POCT PREGNANCY, URINE: Preg Test, Ur: NEGATIVE

## 2024-06-16 LAB — GC/CHLAMYDIA PROBE AMP (~~LOC~~) NOT AT ARMC
Chlamydia: NEGATIVE
Comment: NEGATIVE
Comment: NORMAL
Neisseria Gonorrhea: NEGATIVE

## 2024-09-02 ENCOUNTER — Other Ambulatory Visit: Payer: Self-pay

## 2024-09-02 ENCOUNTER — Emergency Department (HOSPITAL_COMMUNITY)
Admission: EM | Admit: 2024-09-02 | Discharge: 2024-09-02 | Disposition: A | Discharge status: Home / Self Care | Attending: Emergency Medicine | Admitting: Emergency Medicine

## 2024-09-02 ENCOUNTER — Emergency Department (HOSPITAL_COMMUNITY)

## 2024-09-02 DIAGNOSIS — S0011XA Contusion of right eyelid and periocular area, initial encounter: Secondary | ICD-10-CM | POA: Insufficient documentation

## 2024-09-02 DIAGNOSIS — X58XXXA Exposure to other specified factors, initial encounter: Secondary | ICD-10-CM | POA: Diagnosis not present

## 2024-09-02 DIAGNOSIS — R451 Restlessness and agitation: Secondary | ICD-10-CM | POA: Diagnosis present

## 2024-09-02 DIAGNOSIS — T50901A Poisoning by unspecified drugs, medicaments and biological substances, accidental (unintentional), initial encounter: Secondary | ICD-10-CM | POA: Insufficient documentation

## 2024-09-02 LAB — URINALYSIS, ROUTINE W REFLEX MICROSCOPIC
Bacteria, UA: NONE SEEN
Bilirubin Urine: NEGATIVE
Glucose, UA: NEGATIVE mg/dL
Hgb urine dipstick: NEGATIVE
Ketones, ur: 80 mg/dL — AB
Leukocytes,Ua: NEGATIVE
Nitrite: NEGATIVE
Protein, ur: 100 mg/dL — AB
Specific Gravity, Urine: 1.024 (ref 1.005–1.030)
pH: 8 (ref 5.0–8.0)

## 2024-09-02 LAB — CBC WITH DIFFERENTIAL/PLATELET
Abs Immature Granulocytes: 0.03 K/uL (ref 0.00–0.07)
Basophils Absolute: 0 K/uL (ref 0.0–0.1)
Basophils Relative: 0 %
Eosinophils Absolute: 0 K/uL (ref 0.0–0.5)
Eosinophils Relative: 0 %
HCT: 39.8 % (ref 36.0–46.0)
Hemoglobin: 13.8 g/dL (ref 12.0–15.0)
Immature Granulocytes: 0 %
Lymphocytes Relative: 7 %
Lymphs Abs: 0.7 K/uL (ref 0.7–4.0)
MCH: 32.8 pg (ref 26.0–34.0)
MCHC: 34.7 g/dL (ref 30.0–36.0)
MCV: 94.5 fL (ref 80.0–100.0)
Monocytes Absolute: 0.4 K/uL (ref 0.1–1.0)
Monocytes Relative: 4 %
Neutro Abs: 8.4 K/uL — ABNORMAL HIGH (ref 1.7–7.7)
Neutrophils Relative %: 89 %
Platelets: 282 K/uL (ref 150–400)
RBC: 4.21 MIL/uL (ref 3.87–5.11)
RDW: 11.7 % (ref 11.5–15.5)
WBC: 9.5 K/uL (ref 4.0–10.5)
nRBC: 0 % (ref 0.0–0.2)

## 2024-09-02 LAB — I-STAT CHEM 8, ED
BUN: 19 mg/dL (ref 6–20)
Calcium, Ion: 1.15 mmol/L (ref 1.15–1.40)
Chloride: 106 mmol/L (ref 98–111)
Creatinine, Ser: 0.9 mg/dL (ref 0.44–1.00)
Glucose, Bld: 114 mg/dL — ABNORMAL HIGH (ref 70–99)
HCT: 42 % (ref 36.0–46.0)
Hemoglobin: 14.3 g/dL (ref 12.0–15.0)
Potassium: 3.2 mmol/L — ABNORMAL LOW (ref 3.5–5.1)
Sodium: 143 mmol/L (ref 135–145)
TCO2: 22 mmol/L (ref 22–32)

## 2024-09-02 LAB — URINE DRUG SCREEN
Amphetamines: NEGATIVE
Barbiturates: NEGATIVE
Benzodiazepines: POSITIVE — AB
Cocaine: POSITIVE — AB
Fentanyl: NEGATIVE
Methadone Scn, Ur: NEGATIVE
Opiates: NEGATIVE
Tetrahydrocannabinol: POSITIVE — AB

## 2024-09-02 LAB — COMPREHENSIVE METABOLIC PANEL WITH GFR
ALT: 15 U/L (ref 0–44)
AST: 27 U/L (ref 15–41)
Albumin: 4.8 g/dL (ref 3.5–5.0)
Alkaline Phosphatase: 68 U/L (ref 38–126)
Anion gap: 17 — ABNORMAL HIGH (ref 5–15)
BUN: 17 mg/dL (ref 6–20)
CO2: 21 mmol/L — ABNORMAL LOW (ref 22–32)
Calcium: 9.9 mg/dL (ref 8.9–10.3)
Chloride: 104 mmol/L (ref 98–111)
Creatinine, Ser: 0.9 mg/dL (ref 0.44–1.00)
GFR, Estimated: >60 mL/min
Glucose, Bld: 115 mg/dL — ABNORMAL HIGH (ref 70–99)
Potassium: 3.2 mmol/L — ABNORMAL LOW (ref 3.5–5.1)
Sodium: 141 mmol/L (ref 135–145)
Total Bilirubin: 0.6 mg/dL (ref 0.0–1.2)
Total Protein: 8.2 g/dL — ABNORMAL HIGH (ref 6.5–8.1)

## 2024-09-02 LAB — ACETAMINOPHEN LEVEL: Acetaminophen (Tylenol), Serum: <10 ug/mL — ABNORMAL LOW (ref 10–30)

## 2024-09-02 LAB — HCG, SERUM, QUALITATIVE: Preg, Serum: NEGATIVE

## 2024-09-02 LAB — AMMONIA: Ammonia: 32 umol/L (ref 9–35)

## 2024-09-02 LAB — SALICYLATE LEVEL: Salicylate Lvl: <7 mg/dL — ABNORMAL LOW (ref 7.0–30.0)

## 2024-09-02 LAB — ETHANOL: Alcohol, Ethyl (B): <15 mg/dL

## 2024-09-02 MED ORDER — IOHEXOL 350 MG/ML SOLN
75.0000 mL | Freq: Once | INTRAVENOUS | Status: AC | PRN
  Administered 2024-09-02: 75 mL via INTRAVENOUS

## 2024-09-02 NOTE — ED Notes (Signed)
 Patient transported to CT scan .

## 2024-09-02 NOTE — ED Triage Notes (Signed)
 Patient arrived with EMS from home family called EMS due to patient's erratic behavior , paranoid , agitation and right eye injury ( periorbital bruise ) unknown cause . Received Haldol  5 mg IM and Versed  5 mg IM by EMS prior to arrival . Somnolent at arrival .

## 2024-09-02 NOTE — ED Notes (Signed)
 Ginger ale given
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
## 2024-09-02 NOTE — ED Notes (Signed)
 Pt returned from CT
